# Patient Record
Sex: Female | Born: 1970 | Race: White | Hispanic: No | Smoking: Former smoker
Health system: Southern US, Community
[De-identification: ages and names within clinical notes are randomized; demographics above are authoritative.]

## PROBLEM LIST (undated history)

## (undated) DIAGNOSIS — K649 Unspecified hemorrhoids: Secondary | ICD-10-CM

## (undated) DIAGNOSIS — E119 Type 2 diabetes mellitus without complications: Secondary | ICD-10-CM

## (undated) DIAGNOSIS — F419 Anxiety disorder, unspecified: Secondary | ICD-10-CM

## (undated) DIAGNOSIS — I10 Essential (primary) hypertension: Secondary | ICD-10-CM

## (undated) HISTORY — PX: HERNIA REPAIR: SHX51

## (undated) HISTORY — PX: ACHILLES TENDON SURGERY: SHX542

## (undated) HISTORY — PX: ECTOPIC PREGNANCY SURGERY: SHX613

## (undated) HISTORY — PX: TONSILLECTOMY: SUR1361

## (undated) HISTORY — PX: ADENOIDECTOMY: SUR15

## (undated) HISTORY — PX: TUBAL LIGATION: SHX77

---

## 2011-07-31 ENCOUNTER — Inpatient Hospital Stay (HOSPITAL_COMMUNITY)
Admission: EM | Admit: 2011-07-31 | Discharge: 2011-08-03 | DRG: 639 | Disposition: A | Discharge status: Home / Self Care | Payer: Self-pay | Attending: Family Medicine | Admitting: Family Medicine

## 2011-07-31 DIAGNOSIS — IMO0001 Reserved for inherently not codable concepts without codable children: Principal | ICD-10-CM | POA: Diagnosis present

## 2011-07-31 DIAGNOSIS — K219 Gastro-esophageal reflux disease without esophagitis: Secondary | ICD-10-CM | POA: Diagnosis present

## 2011-07-31 DIAGNOSIS — R059 Cough, unspecified: Secondary | ICD-10-CM | POA: Diagnosis present

## 2011-07-31 DIAGNOSIS — F17200 Nicotine dependence, unspecified, uncomplicated: Secondary | ICD-10-CM | POA: Diagnosis present

## 2011-07-31 DIAGNOSIS — R05 Cough: Secondary | ICD-10-CM | POA: Diagnosis present

## 2011-07-31 DIAGNOSIS — F411 Generalized anxiety disorder: Secondary | ICD-10-CM | POA: Diagnosis present

## 2011-07-31 DIAGNOSIS — E781 Pure hyperglyceridemia: Secondary | ICD-10-CM | POA: Diagnosis present

## 2011-07-31 LAB — BASIC METABOLIC PANEL
BUN: 6 mg/dL (ref 6–23)
BUN: 6 mg/dL (ref 6–23)
Calcium: 9.7 mg/dL (ref 8.4–10.5)
Calcium: 9.2 mg/dL (ref 8.4–10.5)
Creatinine, Ser: 0.77 mg/dL (ref 0.50–1.10)
GFR calc Af Amer: >60 mL/min (ref >60)
GFR calc Af Amer: >60 mL/min (ref >60)
GFR calc non Af Amer: >60 mL/min (ref >60)
Potassium: 3.4 mEq/L — ABNORMAL LOW (ref 3.5–5.1)

## 2011-07-31 LAB — DIFFERENTIAL
Basophils Absolute: 0 10*3/uL (ref 0.0–0.1)
Eosinophils Absolute: 0.1 10*3/uL (ref 0.0–0.7)
Eosinophils Relative: 1 % (ref 0–5)
Lymphs Abs: 3 10*3/uL (ref 0.7–4.0)

## 2011-07-31 LAB — CBC
MCHC: 34.7 g/dL (ref 30.0–36.0)
MCV: 88.1 fL (ref 78.0–100.0)
Platelets: 189 10*3/uL (ref 150–400)
RDW: 12.5 % (ref 11.5–15.5)
WBC: 10.7 10*3/uL — ABNORMAL HIGH (ref 4.0–10.5)

## 2011-07-31 LAB — GLUCOSE, CAPILLARY
Glucose-Capillary: >600 mg/dL (ref 70–99)
Glucose-Capillary: 395 mg/dL — ABNORMAL HIGH (ref 70–99)
Glucose-Capillary: 399 mg/dL — ABNORMAL HIGH (ref 70–99)
Glucose-Capillary: 295 mg/dL — ABNORMAL HIGH (ref 70–99)
Glucose-Capillary: 180 mg/dL — ABNORMAL HIGH (ref 70–99)
Glucose-Capillary: 197 mg/dL — ABNORMAL HIGH (ref 70–99)

## 2011-07-31 LAB — URINALYSIS, ROUTINE W REFLEX MICROSCOPIC
Bilirubin Urine: NEGATIVE
Nitrite: NEGATIVE
Protein, ur: NEGATIVE mg/dL
Specific Gravity, Urine: 1.045 — ABNORMAL HIGH (ref 1.005–1.030)
Urobilinogen, UA: 0.2 mg/dL (ref 0.0–1.0)

## 2011-08-01 LAB — BLOOD GAS, VENOUS
Acid-Base Excess: 0.1 mmol/L (ref 0.0–2.0)
Bicarbonate: 23.9 mEq/L (ref 20.0–24.0)
O2 Saturation: 72 %
Patient temperature: 37
TCO2: 20.6 mmol/L (ref 0–100)
pCO2, Ven: 38.3 mmHg — ABNORMAL LOW (ref 45.0–50.0)
pH, Ven: 7.412 — ABNORMAL HIGH (ref 7.250–7.300)
pO2, Ven: 36.7 mmHg (ref 30.0–45.0)

## 2011-08-01 LAB — GLUCOSE, CAPILLARY
Glucose-Capillary: 172 mg/dL — ABNORMAL HIGH (ref 70–99)
Glucose-Capillary: 174 mg/dL — ABNORMAL HIGH (ref 70–99)
Glucose-Capillary: 212 mg/dL — ABNORMAL HIGH (ref 70–99)
Glucose-Capillary: 217 mg/dL — ABNORMAL HIGH (ref 70–99)
Glucose-Capillary: 133 mg/dL — ABNORMAL HIGH (ref 70–99)
Glucose-Capillary: 141 mg/dL — ABNORMAL HIGH (ref 70–99)
Glucose-Capillary: 115 mg/dL — ABNORMAL HIGH (ref 70–99)
Glucose-Capillary: 158 mg/dL — ABNORMAL HIGH (ref 70–99)
Glucose-Capillary: 234 mg/dL — ABNORMAL HIGH (ref 70–99)
Glucose-Capillary: 255 mg/dL — ABNORMAL HIGH (ref 70–99)
Glucose-Capillary: 383 mg/dL — ABNORMAL HIGH (ref 70–99)
Glucose-Capillary: 308 mg/dL — ABNORMAL HIGH (ref 70–99)

## 2011-08-01 LAB — LIPID PANEL
Cholesterol: 165 mg/dL (ref 0–200)
HDL: 22 mg/dL — ABNORMAL LOW (ref >39)
LDL Cholesterol: UNDETERMINED mg/dL (ref 0–99)
Total CHOL/HDL Ratio: 7.5 RATIO
Triglycerides: 451 mg/dL — ABNORMAL HIGH (ref <150)
VLDL: UNDETERMINED mg/dL (ref 0–40)

## 2011-08-01 LAB — MRSA PCR SCREENING: MRSA by PCR: NEGATIVE

## 2011-08-01 LAB — HEMOGLOBIN A1C
Hgb A1c MFr Bld: 15.3 % — ABNORMAL HIGH (ref <5.7)
Mean Plasma Glucose: 392 mg/dL — ABNORMAL HIGH (ref <117)

## 2011-08-02 LAB — GLUCOSE, CAPILLARY
Glucose-Capillary: 307 mg/dL — ABNORMAL HIGH (ref 70–99)
Glucose-Capillary: 345 mg/dL — ABNORMAL HIGH (ref 70–99)

## 2011-08-03 LAB — GLUCOSE, CAPILLARY
Glucose-Capillary: 303 mg/dL — ABNORMAL HIGH (ref 70–99)
Glucose-Capillary: 286 mg/dL — ABNORMAL HIGH (ref 70–99)

## 2011-08-05 NOTE — Discharge Summary (Signed)
NAME:  Vanessa Barajas, DALAL.:  [id number]  MEDICAL RECORD NO.:  [id number]  LOCATION:  1332                         FACILITY:  [facility]  PHYSICIAN:  Vanessa Sacks, MD    DATE OF BIRTH:  07-Jul-1971  DATE OF ADMISSION:  07/31/2011 DATE OF DISCHARGE:  08/03/2011                              DISCHARGE SUMMARY   PRIMARY CARE PHYSICIAN:  Dr. Marvis Barajas, [facility]  CONDITION ON DISCHARGE:  Improved.  DISPOSITION:  Home.  Please note that the patient's maid name is Vanessa Barajas.  DISCHARGE DIAGNOSES: 1. New diagnosis of uncontrolled diabetes mellitus type 2. 2. Hypertriglyceridemia. 3. Anxiety. 4. Gastroesophageal reflux disease. 5. Smoker, recommended cessation.  HISTORY OF PRESENT ILLNESS:  This is a 40 year old woman who presented with symptomatology, suggestive of diabetes mellitus including polyuria and polydipsia.  She was found to have uncontrolled diabetes and admitted for further evaluation and treatment.  HOSPITAL COURSE:  Vanessa Barajas was admitted and initially placed on insulin infusion.  She has been transitioned to subcutaneous insulin. She was seen in consultation with inpatient diabetic coordinator as well as Nutrition.  She has been given guidance on self-administration of insulin and has been able to do this successfully.  I have also reinforced correct usage of insulin with her and her husband today.  She feels quite well and is ready to go home.  She will be discharged with her Lantus pen as well as short-acting insulin pen and other prescriptions as detailed below.  Of note, case management did discuss availability of insulin at the patient's crisis center which can provide her with Lantus.  In regard to her hypertriglyceridemia, recommend diet and repeat lipid panel in the outpatient setting.  Would consider directed pharmacologic therapy if her triglycerides remain high.  Her anxiety and GERD have remained stable.   She has been counseled on smoking cessation.  CONSULTATIONS:  None.  PROCEDURES:  None.  IMAGING:  None.  MICROBIOLOGY:  None.  PERTINENT LABORATORY STUDIES: 1. Urine pregnancy was negative. 2. Lipid panel notable for a total cholesterol of 165, triglycerides     of 451, HDL 22, LDL not calculated secondary to     hypertriglyceridemia. 3. Hemoglobin A1c 15.3. 4. CBC essentially unremarkable. 5. Basic metabolic panel on discharge unremarkable. 6. Capillary blood sugars stable. 7. Urinalysis essentially negative.  PHYSICAL EXAMINATION:  GENERAL:  On discharge, the patient is feeling well. CARDIOVASCULAR:  Regular rate and rhythm.  No murmur, rub, or gallop. RESPIRATORY:  Clear to auscultation bilaterally.  No wheezes, rales, or rhonchi.  Normal respiratory effort. EXTREMITIES:  No lower extremity edema.  Feet are well dry, but without lesions noted.  DISCHARGE INSTRUCTIONS:  The patient will be discharged home today. Diet is a diabetic diet, low starch.  Activity is unrestricted.  Follow up with Dr. Marvis Barajas in 1 week.  DISCHARGE MEDICATIONS NEW: 1. Acetaminophen 325 mg 2 tablets every 4 hours as needed for pain. 2. Glucometer, use as directed. 3. Glucose test strips, use as directed. 4. Insulin aspartate FlexPen 1-9 units subcutaneously t.i.d. with     meals.  For blood sugar 70-120 zero units, for blood sugar 121-150  give 1 unit, for blood sugar 151-200 give 2 units, for blood sugar     201-250 give 3 units, for blood sugar 251-300 give 5 units, for     blood sugar 301-350 give 7 units, for blood sugar 351-400 give 9     units, for blood sugar greater than 400 call physician. 5. Lantus pen 30 units subcutaneously daily. 6. Insulin pen needles, use as directed. 7. Metformin 500 mg p.o. b.i.d. 8. Nicotine transdermal patch 14 mg per 24 hours use daily if not     smoking.  MEDICATIONS CHANGED:  Flexeril 10 mg p.o. t.i.d. as needed for muscle spasm.  No  medications given.  Resume the following medications, 1. Albuterol inhaler 2 puffs twice a day as needed for shortness of     breath and chronic bronchitis. 2. Ibuprofen 200 mg 4 capsules every 6 hours as needed for headache. 3. Nexium 40 mg p.o. daily. 4. Risperdal 1 tablet 1 mg p.o. nightly. 5. Xanax 1 mg 4 tablets p.o. nightly.  No prescription given.  Time coordinating discharge including reinforcing teaching and answering questions of the patient and husband, 60 minutes.     Vanessa Sacks, MD     DG/MEDQ  D:  08/03/2011  T:  08/03/2011  Job:  [id number]  cc:   Dr. Marvis Barajas [facility]  Electronically Signed by Vanessa Barajas  on 08/05/2011 10:28:11 PM

## 2011-08-13 NOTE — H&P (Signed)
NAME:  Vanessa Barajas, Vanessa Barajas NO.:  [id number]  MEDICAL RECORD NO.:  [id number]  LOCATION:  WLED                         FACILITY:  [facility]  PHYSICIAN:  Suella Grove, MD          DATE OF BIRTH:  May 17, 1971  DATE OF ADMISSION:  07/31/2011 DATE OF DISCHARGE:                             HISTORY & PHYSICAL   PRIMARY CARE PHYSICIAN:  Marvis Repress who is part of the [facility].  CHIEF COMPLAINT:  Vision changes and light-headedness.  HISTORY OF PRESENT ILLNESS:  The patient is a 40 year old woman, history of anxiety, GERD, and chronic cough who presents for evaluation of vision changes and light-headedness/dizziness.  The patient reports that over the past 6 months that she has been having problems with polyuria and polydipsia, which she did not feel needed evaluation.  She reports that she had been feeling this way for a while and never really considered that she may be sick.  However, starting last night, she reports that she started to have some blurry vision with associated light-headedness and dizziness.  No problems with any chest pain, shortness of breath, nausea, or vomiting.  Does complain of a little bit of dyspnea on exertion at baseline, although that has not grossly changed.  No problems with any headache or other neurologic deficits.  In the emergency department, her temperature was 98.6, blood pressure 119/89, heart rate 102, respirations 20, and saturating 99% on room air. Laboratory work was significant for a glucose of 718 in addition with some other electrolyte disturbances.  The patient was started on an insulin stabilization protocol, admitted for further treatment and evaluation.  PAST MEDICAL HISTORY: 1. Anxiety. 2. Gastroesophageal reflux disease. 3. Chronic cough, for which she is taking an inhaler and given the     diagnosis of "chronic bronchitis."  PAST SURGICAL HISTORY:  Remote history of tonsillectomy.   Bilateral tubal ligation in 2001.  ALLERGIES:  No known drug allergies.  MEDICATIONS: 1. Xanax 4 mg p.o. q.h.s. 2. Risperdal, unknown dose. 3. Nexium 40 mg p.o. daily. 4. Albuterol 2 puffs inhaled b.i.d. p.r.n.  FAMILY HISTORY:  No known history of diabetes in the family.  SOCIAL HISTORY:  Married, currently not working.  Reports smoking at least half a pack per day for the past 24 years.  No EtOH.  Denies any illicits.  REVIEW OF SYSTEMS:  GENERAL:  As above.  HEENT:  Positive vision changes as described above.  No dysphagia, problems with hearing.  RESPIRATORY: Chronic cough with occasional scant hemoptysis, which has been evaluated by the PCP.  CARDIOVASCULAR:  No chest pain, palpitations, or shortness of breath.  GASTROINTESTINAL:  No abdominal pain, melena, hematochezia, diarrhea, or hematemesis.  GENITOURINARY:  No dysuria, hematuria, or increased frequency/urgency other than the polyuria described above. ENDOCRINE:  As described above.  HEMATOLOGIC:  No easy bruising or bleeding.  NEUROLOGIC:  As above.  PSYCHIATRIC:  No depression.  No suicidal or homicidal ideations.  PHYSICAL EXAMINATION:  GENERAL:  Alert and oriented x3, in no apparent distress, sitting up, drinking water. VITAL SIGNS:  As above. HEENT:  Sclerae anicteric, pupils equal and reactive to light and  accommodation, extraocular motions are intact, oropharynx is unremarkable. NECK:  Soft, full.  No LAD, lymphadenopathy, or thyromegaly. LUNGS:  Clear to auscultation bilaterally.  No wheezes, rales, or rhonchi. HEART:  Regular rate and rhythm, normal S1 and S2.  No rubs, gallops, or murmurs. ABDOMEN:  Soft, obese, nontender to palpation, positive bowel sounds, no masses. EXTREMITIES:  No cyanosis or clubbing.  Positive 2+ nonpitting edema. 2+ DP and radial pulses bilaterally. NEUROLOGIC:  Speech is fluent.  Cranial nerves II through XII are intact.  Finger-nose-finger intact.  Negative pronator drift.   Strength is 5/5 bilateral upper and lower extremities.  Sensation is grossly intact.  Deep tendon reflexes are 2+ throughout.  LABORATORY DATA:  See the complete blood count:  White count 10.7, hemoglobin 15.2, and platelet count 189.  Chemistry:  Sodium 125, potassium 3.4, chloride 87, bicarbonate 23, BUN 6, creatinine 0.72, glucose 718, calcium 9.7.  Urinalysis:  Glucose greater on 1000, trace ketones.  IMPRESSION/PLAN: 1. Hyperglycemia:  Likely nonketotic hyperosmolar state.  We will     admit to the General Medicine Service or step-down service on an     insulin drip, to stabilize the patient's blood sugar.  Plan will be     to titrate her off the insulin drip and on to p.o. when able to     bring down the glucose to less than 200.  We will have to keep a     close eye on the potassium given that her potassium started of at     3.4 and is expected to decrease with the insulin regimen.  We also     have to continue hydrating the patient, given likely volume     depleted state from her hyperosmolar condition.  Start metformin     500 mg b.i.d. starting tomorrow.  Check her hemoglobin A1c as well     as fasting lipid panel given lipid goals with diabetes.  Given her     blood pressure, we will attempt to start an ACE inhibitor, although     the patient does report that she generally has lower blood     pressures. 2. Vision changes, light-headedness/dizziness:  Likely radiated to the     hyperglycemia.  We will continue to monitor as her blood sugars     improve. 3. Hyponatremia:  Likely artifactual secondary to hyperosmolar state.     We will continue to monitor. 4. Anxiety:  Continue the patient's home regimen of Xanax and     Risperdal.  I did mention to the patient that the Risperdal may be     contributing to her glucose abnormalities.  We will have to follow     up with her PCP as an outpatient. 5. Gastroesophageal reflux disease:  Continue home Nexium. 6. Chronic cough:   Albuterol p.r.n. 7. Tobacco abuse:  Nicotine patch. 8. Fluid/electrolytes/nutrition:  Fluids and electrolytes as above. We     will start a low carbohydrate diet.  DISPOSITION:  Diabetes consult.  In addition, the patient has been educated on the effect of weight loss on her condition.  She acknowledges and is interested in losing more weight.  CODE STATUS:  Full code.          ______________________________ Suella Grove, MD     AC/MEDQ  D:  07/31/2011  T:  08/01/2011  Job:  [id number]  Electronically Signed by Suella Grove MD on 08/13/2011 06:41:52 PM

## 2014-03-09 ENCOUNTER — Encounter (HOSPITAL_COMMUNITY): Payer: Self-pay | Admitting: Emergency Medicine

## 2014-03-09 ENCOUNTER — Emergency Department (HOSPITAL_COMMUNITY)
Admission: EM | Admit: 2014-03-09 | Discharge: 2014-03-10 | Disposition: A | Discharge status: Home / Self Care | Payer: BC Managed Care – PPO | Attending: Emergency Medicine | Admitting: Emergency Medicine

## 2014-03-09 DIAGNOSIS — L723 Sebaceous cyst: Secondary | ICD-10-CM

## 2014-03-09 DIAGNOSIS — Z87891 Personal history of nicotine dependence: Secondary | ICD-10-CM | POA: Insufficient documentation

## 2014-03-09 DIAGNOSIS — E119 Type 2 diabetes mellitus without complications: Secondary | ICD-10-CM | POA: Insufficient documentation

## 2014-03-09 HISTORY — DX: Type 2 diabetes mellitus without complications: E11.9

## 2014-03-09 NOTE — Discharge Instructions (Signed)
Keep wound clean. Follow up with your doctor for further evaluation.

## 2014-03-09 NOTE — ED Provider Notes (Signed)
CSN: [id number]     Arrival date & time 03/09/14  2218 History  This chart was scribed for non-physician practitioner Alvina Chou, PA-C working with Hoy Morn, MD by Zettie Pho, ED Scribe. This patient was seen in room WTR7/WTR7 and the patient's care was started at 11:40 PM.    Chief Complaint  Patient presents with  . Abscess   Patient is a 43 y.o. female presenting with abscess. The history is provided by the patient. No language interpreter was used.  Abscess Location:  Head/neck Head/neck abscess location:  L ear Abscess quality: painful   Red streaking: no   Progression:  Worsening Pain details:    Severity:  Moderate   Timing:  Constant   Progression:  Worsening Chronicity:  New Context: diabetes   Relieved by:  Nothing Worsened by:  Nothing tried Ineffective treatments:  None tried Risk factors: no prior abscess    HPI Comments: Vanessa Barajas is a 43 y.o. female who presents to the Emergency Department complaining of a painful abscess to the top of the external left ear onset about a week ago that she states has been progressively worsening. She denies any potential injury or trauma to the area or history of prior abscesses. Patient has a history of DM and states that her blood sugar has been well-controlled.   Past Medical History  Diagnosis Date  . Diabetes mellitus without complication    Past Surgical History  Procedure Laterality Date  . Hernia repair    . Tonsillectomy    . Adenoidectomy     Family History  Problem Relation Age of Onset  . Leukemia Mother   . Cancer Other    History  Substance Use Topics  . Smoking status: Former Research scientist (life sciences)  . Smokeless tobacco: Not on file  . Alcohol Use: No   OB History   Grav Para Term Preterm Abortions TAB SAB Ect Mult Living                 Review of Systems  Skin: Positive for wound (abscess).  All other systems reviewed and are negative.  Allergies  Review of patient's allergies indicates no known  allergies.  Home Medications   Prior to Admission medications   Not on File   Triage Vitals: BP 134/87  Pulse 95  Temp(Src) 98.3 F (36.8 C) (Oral)  Resp 18  SpO2 94%  LMP 01/08/2014  Physical Exam  Nursing note and vitals reviewed. Constitutional: She is oriented to person, place, and time. She appears well-developed and well-nourished. No distress.  HENT:  Head: Normocephalic and atraumatic.  Large fluatuant mass at the left ear auricle. No open wound.   Eyes: Conjunctivae are normal.  Neck: Normal range of motion. Neck supple.  Pulmonary/Chest: Effort normal. No respiratory distress.  Abdominal: She exhibits no distension.  Musculoskeletal: Normal range of motion.  Neurological: She is alert and oriented to person, place, and time.  Skin: Skin is warm and dry.  Psychiatric: She has a normal mood and affect. Her behavior is normal.    ED Course  Procedures (including critical care time)  DIAGNOSTIC STUDIES: Oxygen Saturation is 94% on room air, adequate by my interpretation.    COORDINATION OF CARE: 11:42 PM- Will perform an incision and drainage of the area. Discussed treatment plan with patient at bedside and patient verbalized agreement.   11:53 PM- Successfully performed incision and drainage. Advised patient to follow up with her PCP. Discussed treatment plan with patient at bedside and  patient verbalized agreement.   INCISION AND DRAINAGE PROCEDURE NOTE: Patient identification was confirmed and verbal consent was obtained. This procedure was performed by Alvina Chou, PA-C  at 11:42 PM. Site: left ear Sterile procedures observed Anesthetic used (type and amt): Pain Ease topical spray Blade size: 11 Drainage: sebaceous fluid Complexity: simple Site anesthetized, incision made over site, wound drained and explored loculations, rinsed with copious amounts of normal saline, wound packed with sterile gauze, covered with dry, sterile dressing.   Pt tolerated procedure well without complications.  Instructions for care discussed verbally and pt provided with additional written instructions for homecare and f/u.   Labs Review Labs Reviewed - No data to display  Imaging Review No results found.   EKG Interpretation None      MDM   Final diagnoses:  Sebaceous cyst of ear    12:00 AM Cyst drained without complication. Vitals stable and patient afebrile. Patient will follow up with her PCP.   I personally performed the services described in this documentation, which was scribed in my presence. The recorded information has been reviewed and is accurate.     Alvina Chou, PA-C 03/10/14 0001

## 2014-03-09 NOTE — ED Notes (Signed)
Pt states she is having pain to her left ear  Pt has swelling to the top part of her ear  Pt states it is painful  Pt states it has been there about a week

## 2014-03-10 NOTE — ED Provider Notes (Signed)
Medical screening examination/treatment/procedure(s) were performed by non-physician practitioner and as supervising physician I was immediately available for consultation/collaboration.   EKG Interpretation None        Hoy Morn, MD 03/10/14 [phone]

## 2014-07-17 ENCOUNTER — Emergency Department (HOSPITAL_COMMUNITY)
Admission: EM | Admit: 2014-07-17 | Discharge: 2014-07-18 | Discharge status: Against Medical Advice | Payer: BC Managed Care – PPO | Attending: Emergency Medicine | Admitting: Emergency Medicine

## 2014-07-17 ENCOUNTER — Encounter (HOSPITAL_COMMUNITY): Payer: Self-pay | Admitting: Emergency Medicine

## 2014-07-17 DIAGNOSIS — R0602 Shortness of breath: Secondary | ICD-10-CM | POA: Diagnosis not present

## 2014-07-17 DIAGNOSIS — R079 Chest pain, unspecified: Secondary | ICD-10-CM | POA: Diagnosis not present

## 2014-07-17 DIAGNOSIS — E119 Type 2 diabetes mellitus without complications: Secondary | ICD-10-CM | POA: Insufficient documentation

## 2014-07-17 DIAGNOSIS — I10 Essential (primary) hypertension: Secondary | ICD-10-CM | POA: Insufficient documentation

## 2014-07-17 HISTORY — DX: Essential (primary) hypertension: I10

## 2014-07-17 HISTORY — DX: Anxiety disorder, unspecified: F41.9

## 2014-07-17 LAB — CBC
HCT: 34 % — ABNORMAL LOW (ref 36.0–46.0)
Hemoglobin: 10.5 g/dL — ABNORMAL LOW (ref 12.0–15.0)
MCH: 26.3 pg (ref 26.0–34.0)
MCHC: 30.9 g/dL (ref 30.0–36.0)
MCV: 85 fL (ref 78.0–100.0)
PLATELETS: 226 10*3/uL (ref 150–400)
RBC: 4 MIL/uL (ref 3.87–5.11)
RDW: 15.1 % (ref 11.5–15.5)
WBC: 8.6 10*3/uL (ref 4.0–10.5)

## 2014-07-17 LAB — BASIC METABOLIC PANEL
ANION GAP: 15 (ref 5–15)
BUN: 15 mg/dL (ref 6–23)
CALCIUM: 9.1 mg/dL (ref 8.4–10.5)
CO2: 22 meq/L (ref 19–32)
Chloride: 102 mEq/L (ref 96–112)
Creatinine, Ser: 1.05 mg/dL (ref 0.50–1.10)
GFR, EST AFRICAN AMERICAN: 73 mL/min — AB (ref >90)
GFR, EST NON AFRICAN AMERICAN: 63 mL/min — AB (ref >90)
Glucose, Bld: 101 mg/dL — ABNORMAL HIGH (ref 70–99)
Potassium: 3.5 mEq/L — ABNORMAL LOW (ref 3.7–5.3)
SODIUM: 139 meq/L (ref 137–147)

## 2014-07-17 LAB — I-STAT TROPONIN, ED: TROPONIN I, POC: 0 ng/mL (ref 0.00–0.08)

## 2014-07-17 NOTE — ED Notes (Signed)
Pt states she has been having chest pain off and on for the past 2 days but it has been constant for the past 2 hrs Pt states the pain radiates up into her neck and down her left arm  Pt states she has felt short of breath, nausea, and lightheaded today  Pt is tearful and anxious in triage

## 2014-07-18 NOTE — ED Notes (Signed)
Called pt to take to treatment room  No response from lobby 

## 2014-09-22 ENCOUNTER — Encounter (HOSPITAL_COMMUNITY): Payer: Self-pay | Admitting: *Deleted

## 2014-09-22 ENCOUNTER — Emergency Department (HOSPITAL_COMMUNITY)
Admission: EM | Admit: 2014-09-22 | Discharge: 2014-09-22 | Disposition: A | Discharge status: Home / Self Care | Payer: BC Managed Care – PPO | Attending: Emergency Medicine | Admitting: Emergency Medicine

## 2014-09-22 DIAGNOSIS — F419 Anxiety disorder, unspecified: Secondary | ICD-10-CM | POA: Insufficient documentation

## 2014-09-22 DIAGNOSIS — I10 Essential (primary) hypertension: Secondary | ICD-10-CM | POA: Insufficient documentation

## 2014-09-22 DIAGNOSIS — Z87891 Personal history of nicotine dependence: Secondary | ICD-10-CM | POA: Insufficient documentation

## 2014-09-22 DIAGNOSIS — E119 Type 2 diabetes mellitus without complications: Secondary | ICD-10-CM | POA: Diagnosis not present

## 2014-09-22 DIAGNOSIS — Z79899 Other long term (current) drug therapy: Secondary | ICD-10-CM | POA: Insufficient documentation

## 2014-09-22 DIAGNOSIS — J029 Acute pharyngitis, unspecified: Secondary | ICD-10-CM | POA: Diagnosis present

## 2014-09-22 LAB — RAPID STREP SCREEN (MED CTR MEBANE ONLY): Streptococcus, Group A Screen (Direct): NEGATIVE

## 2014-09-22 MED ORDER — HYDROCODONE-HOMATROPINE 5-1.5 MG/5ML PO SYRP: 5.0000 mL | ORAL_SOLUTION | Freq: Four times a day (QID) | ORAL | Status: DC | PRN

## 2014-09-22 MED ORDER — LIDOCAINE VISCOUS 2 % MT SOLN
15.0000 mL | Freq: Once | OROMUCOSAL | Status: AC
  Administered 2014-09-22: 15 mL via OROMUCOSAL
  Filled 2014-09-22: qty 15

## 2014-09-22 NOTE — Discharge Instructions (Signed)
Please follow up with your primary care physician in 1-2 days. If you do not have one please call the [facility] number listed above. Please alternate between Motrin and Tylenol every three hours for fevers and pain. Please read all discharge instructions and return precautions.  ° °Pharyngitis °Pharyngitis is redness, pain, and swelling (inflammation) of your pharynx.  °CAUSES  °Pharyngitis is usually caused by infection. Most of the time, these infections are from viruses (viral) and are part of a cold. However, sometimes pharyngitis is caused by bacteria (bacterial). Pharyngitis can also be caused by allergies. Viral pharyngitis may be spread from person to person by coughing, sneezing, and personal items or utensils (cups, forks, spoons, toothbrushes). Bacterial pharyngitis may be spread from person to person by more intimate contact, such as kissing.  °SIGNS AND SYMPTOMS  °Symptoms of pharyngitis include:   °· Sore throat.   °· Tiredness (fatigue).   °· Low-grade fever.   °· Headache. °· Joint pain and muscle aches. °· Skin rashes. °· Swollen lymph nodes. °· Plaque-like film on throat or tonsils (often seen with bacterial pharyngitis). °DIAGNOSIS  °Your health care provider will ask you questions about your illness and your symptoms. Your medical history, along with a physical exam, is often all that is needed to diagnose pharyngitis. Sometimes, a rapid strep test is done. Other lab tests may also be done, depending on the suspected cause.  °TREATMENT  °Viral pharyngitis will usually get better in 3-4 days without the use of medicine. Bacterial pharyngitis is treated with medicines that kill germs (antibiotics).  °HOME CARE INSTRUCTIONS  °· Drink enough water and fluids to keep your urine clear or pale yellow.   °· Only take over-the-counter or prescription medicines as directed by your health care provider:   °¨ If you are prescribed antibiotics, make sure you finish them even if you start  to feel better.   °¨ Do not take aspirin.   °· Get lots of rest.   °· Gargle with 8 oz of salt water (½ tsp of salt per 1 qt of water) as often as every 1-2 hours to soothe your throat.   °· Throat lozenges (if you are not at risk for choking) or sprays may be used to soothe your throat. °SEEK MEDICAL CARE IF:  °· You have large, tender lumps in your neck. °· You have a rash. °· You cough up green, yellow-brown, or bloody spit. °SEEK IMMEDIATE MEDICAL CARE IF:  °· Your neck becomes stiff. °· You drool or are unable to swallow liquids. °· You vomit or are unable to keep medicines or liquids down. °· You have severe pain that does not go away with the use of recommended medicines. °· You have trouble breathing (not caused by a stuffy nose). °MAKE SURE YOU:  °· Understand these instructions. °· Will watch your condition. °· Will get help right away if you are not doing well or get worse. °Document Released: 10/27/2005 Document Revised: 08/17/2013 Document Reviewed: 07/04/2013 °[employer]® Patient Information ©2015 [employer]. This information is not intended to replace advice given to you by your health care provider. Make sure you discuss any questions you have with your health care provider. ° °

## 2014-09-22 NOTE — ED Notes (Signed)
Pt states that she began having a sore throat on Monday; pt states she took some Cephalexin she had at home from May; pt states it was approx 2 days worth; pt states that she was feeling better but that the sore throat has increased today; pt states that her mouth burns and it is like swallowing glass

## 2014-09-22 NOTE — ED Provider Notes (Signed)
CSN: [id number]     Arrival date & time 09/22/14  0402 History   First MD Initiated Contact with Patient 09/22/14 0424     Chief Complaint  Patient presents with  . Sore Throat     (Consider location/radiation/quality/duration/timing/severity/associated sxs/prior Treatment) HPI Comments: Patient is a 43 yo M PMHx significant for DM, HTN presenting to the ED for five-day history of burning sharp pain in her sore throat with associated fever and chills. Patient denies any associated nasal congestion, rhinorrhea, cough. She states she has been using Tylenol and Motrin with improvement of her fevers. She states she had some leftover Cephalaexin seem to improve her symptoms until she ran out of her antibiotic. Denies any vomiting, diarrhea, abdominal pain. Denies sick contacts.   Patient is a 43 y.o. female presenting with pharyngitis.  Sore Throat Associated symptoms include chills, a fever and a sore throat.    Past Medical History  Diagnosis Date  . Diabetes mellitus without complication   . Hypertension   . Anxiety    Past Surgical History  Procedure Laterality Date  . Hernia repair    . Tonsillectomy    . Adenoidectomy     Family History  Problem Relation Age of Onset  . Leukemia Mother   . Cancer Other    History  Substance Use Topics  . Smoking status: Former Smoker    Quit date: 09/02/2013  . Smokeless tobacco: Not on file  . Alcohol Use: No   OB History    No data available     Review of Systems  Constitutional: Positive for fever and chills.  HENT: Positive for sore throat.   All other systems reviewed and are negative.     Allergies  Review of patient's allergies indicates no known allergies.  Home Medications   Prior to Admission medications   Medication Sig Start Date End Date Taking? Authorizing Provider  acetaminophen (TYLENOL) 500 MG tablet Take 1,000 mg by mouth every 6 (six) hours as needed for moderate pain.   Yes Historical Provider, MD   ALPRAZolam (XANAX XR) 2 MG 24 hr tablet TAKE ONE TABLET BY MOUTH ONCE DAILY AS NEEDED 09/21/14  Yes Historical Provider, MD  ALPRAZolam (XANAX XR) 2 MG 24 hr tablet Take 2 mg by mouth daily as needed. Anxiety   Yes Historical Provider, MD  escitalopram (LEXAPRO) 20 MG tablet Take 20 mg by mouth at bedtime.    Yes Historical Provider, MD  ibuprofen (ADVIL,MOTRIN) 200 MG tablet Take 800 mg by mouth every 6 (six) hours as needed for moderate pain.   Yes Historical Provider, MD  IRON PO Take 1 tablet by mouth daily.   Yes Historical Provider, MD  lisinopril (PRINIVIL,ZESTRIL) 20 MG tablet Take 20 mg by mouth every morning.   Yes Historical Provider, MD  metaxalone (SKELAXIN) 400 MG tablet Take 400 mg by mouth once as needed (restless leg).   Yes Historical Provider, MD  metFORMIN (GLUCOPHAGE) 1000 MG tablet Take 1,000 mg by mouth 2 (two) times daily with a meal.   Yes Historical Provider, MD  omeprazole (PRILOSEC) 40 MG capsule Take 40 mg by mouth daily.   Yes Historical Provider, MD  clonazePAM (KLONOPIN) 2 MG tablet Take 2 mg by mouth daily.    Historical Provider, MD  HYDROcodone-homatropine (HYCODAN) 5-1.5 MG/5ML syrup Take 5 mLs by mouth every 6 (six) hours as needed for cough. 09/22/14   Garvey Westcott L Razan Siler, PA-C   BP 133/88 mmHg  Pulse 86  Temp(Src) 98  F (36.7 C) (Oral)  Resp 16  SpO2 98%  LMP 08/25/2014 Physical Exam  Constitutional: She is oriented to person, place, and time. She appears well-developed and well-nourished. No distress.  HENT:  Head: Normocephalic and atraumatic.  Right Ear: External ear normal.  Left Ear: External ear normal.  Nose: Nose normal.  Mouth/Throat: Uvula is midline and mucous membranes are normal. No trismus in the jaw. No uvula swelling. Posterior oropharyngeal erythema present. No oropharyngeal exudate, posterior oropharyngeal edema or tonsillar abscesses.  Eyes: Conjunctivae are normal.  Neck: Normal range of motion. Neck supple.  Cardiovascular:  Normal rate, regular rhythm and normal heart sounds.   Pulmonary/Chest: Effort normal and breath sounds normal. No respiratory distress.  Abdominal: Soft. There is no tenderness.  Musculoskeletal: Normal range of motion.  Lymphadenopathy:    She has cervical adenopathy.  Neurological: She is alert and oriented to person, place, and time.  Skin: Skin is warm and dry. She is not diaphoretic.  Psychiatric: She has a normal mood and affect.  Nursing note and vitals reviewed.   ED Course  Procedures (including critical care time) Medications  lidocaine (XYLOCAINE) 2 % viscous mouth solution 15 mL (15 mLs Mouth/Throat Given 09/22/14 0502)    Labs Review Labs Reviewed  RAPID STREP SCREEN  CULTURE, GROUP A STREP    Imaging Review No results found.   EKG Interpretation None      MDM   Final diagnoses:  Viral pharyngitis    Filed Vitals:   09/22/14 0410  BP: 133/88  Pulse: 86  Temp: 98 F (36.7 C)  Resp: 16   Afebrile, NAD, non-toxic appearing, AAOx4.  Pt afebrile without tonsillar exudate, negative strep. Presents with mild cervical lymphadenopathy, & dysphagia; diagnosis of viral pharyngitis. No abx indicated. DC w symptomatic tx for pain  Pt does not appear dehydrated, but did discuss importance of water rehydration. Presentation non concerning for PTA or infxn spread to soft tissue. No trismus or uvula deviation. Specific return precautions discussed. Pt able to drink water in ED without difficulty with intact air way. Recommended PCP follow up.     Harlow Mares, PA-C 09/22/14 0507  Merryl Hacker, MD 09/22/14 [phone]

## 2014-09-24 LAB — CULTURE, GROUP A STREP

## 2015-12-02 ENCOUNTER — Emergency Department (HOSPITAL_COMMUNITY)
Admission: EM | Admit: 2015-12-02 | Discharge: 2015-12-02 | Disposition: A | Discharge status: Home / Self Care | Payer: BLUE CROSS/BLUE SHIELD | Attending: Emergency Medicine | Admitting: Emergency Medicine

## 2015-12-02 ENCOUNTER — Encounter (HOSPITAL_COMMUNITY): Payer: Self-pay | Admitting: Oncology

## 2015-12-02 DIAGNOSIS — E119 Type 2 diabetes mellitus without complications: Secondary | ICD-10-CM | POA: Diagnosis not present

## 2015-12-02 DIAGNOSIS — I10 Essential (primary) hypertension: Secondary | ICD-10-CM | POA: Insufficient documentation

## 2015-12-02 DIAGNOSIS — Z7984 Long term (current) use of oral hypoglycemic drugs: Secondary | ICD-10-CM | POA: Insufficient documentation

## 2015-12-02 DIAGNOSIS — L03211 Cellulitis of face: Secondary | ICD-10-CM | POA: Insufficient documentation

## 2015-12-02 DIAGNOSIS — Z79899 Other long term (current) drug therapy: Secondary | ICD-10-CM | POA: Insufficient documentation

## 2015-12-02 DIAGNOSIS — Z87891 Personal history of nicotine dependence: Secondary | ICD-10-CM | POA: Diagnosis not present

## 2015-12-02 DIAGNOSIS — R220 Localized swelling, mass and lump, head: Secondary | ICD-10-CM | POA: Diagnosis present

## 2015-12-02 MED ORDER — CLINDAMYCIN HCL 300 MG PO CAPS: 300.0000 mg | ORAL_CAPSULE | Freq: Three times a day (TID) | ORAL | Status: DC

## 2015-12-02 MED ORDER — CLINDAMYCIN HCL 300 MG PO CAPS
300.0000 mg | ORAL_CAPSULE | Freq: Once | ORAL | Status: AC
  Administered 2015-12-02: 300 mg via ORAL
  Filled 2015-12-02: qty 1

## 2015-12-02 NOTE — Discharge Instructions (Signed)

## 2015-12-02 NOTE — ED Notes (Signed)
Pt presents d/t left facial swelling since Friday.  Pt rates pain 8/10 aching in nature.

## 2015-12-02 NOTE — ED Provider Notes (Signed)
CSN: [id number]     Arrival date & time 12/02/15  0303 History   First MD Initiated Contact with Patient 12/02/15 0422     Chief Complaint  Patient presents with  . Facial Swelling     (Consider location/radiation/quality/duration/timing/severity/associated sxs/prior Treatment) HPI Comments: Patient presents with facial pain and swelling x 3 days without injury, or fever. She denies any dental pain. She was prompted to come to the emergency room when she woke up tonight with swelling that spread to the lower left face and increased swelling around the eye. No visual impairment or pain with eye movement. No eye drainage. No sore throat or difficulty swallowing.   The history is provided by the patient. No language interpreter was used.    Past Medical History  Diagnosis Date  . Diabetes mellitus without complication ([place])   . Hypertension   . Anxiety    Past Surgical History  Procedure Laterality Date  . Hernia repair    . Tonsillectomy    . Adenoidectomy     Family History  Problem Relation Age of Onset  . Leukemia Mother   . Cancer Other    Social History  Substance Use Topics  . Smoking status: Former Smoker    Quit date: 09/02/2013  . Smokeless tobacco: Never Used  . Alcohol Use: No   OB History    No data available     Review of Systems  Constitutional: Negative for fever and chills.  HENT: Positive for facial swelling. Negative for congestion, dental problem, sinus pressure, sore throat and trouble swallowing.   Eyes: Negative for pain, discharge and visual disturbance.  Gastrointestinal: Negative.  Negative for nausea.  Musculoskeletal: Negative.  Negative for myalgias.  Skin: Positive for color change. Negative for wound.  Neurological: Negative.  Negative for headaches.      Allergies  Review of patient's allergies indicates no known allergies.  Home Medications   Prior to Admission medications   Medication Sig Start Date End Date Taking?  Authorizing Provider  acetaminophen (TYLENOL) 500 MG tablet Take 1,000 mg by mouth every 6 (six) hours as needed for moderate pain.    Historical Provider, MD  ALPRAZolam (XANAX XR) 2 MG 24 hr tablet TAKE ONE TABLET BY MOUTH ONCE DAILY AS NEEDED 09/21/14   Historical Provider, MD  ALPRAZolam (XANAX XR) 2 MG 24 hr tablet Take 2 mg by mouth daily as needed. Anxiety    Historical Provider, MD  clonazePAM (KLONOPIN) 2 MG tablet Take 2 mg by mouth daily.    Historical Provider, MD  escitalopram (LEXAPRO) 20 MG tablet Take 20 mg by mouth at bedtime.     Historical Provider, MD  HYDROcodone-homatropine (HYCODAN) 5-1.5 MG/5ML syrup Take 5 mLs by mouth every 6 (six) hours as needed for cough. 09/22/14   Jennifer Piepenbrink, PA-C  ibuprofen (ADVIL,MOTRIN) 200 MG tablet Take 800 mg by mouth every 6 (six) hours as needed for moderate pain.    Historical Provider, MD  IRON PO Take 1 tablet by mouth daily.    Historical Provider, MD  lisinopril (PRINIVIL,ZESTRIL) 20 MG tablet Take 20 mg by mouth every morning.    Historical Provider, MD  metaxalone (SKELAXIN) 400 MG tablet Take 400 mg by mouth once as needed (restless leg).    Historical Provider, MD  metFORMIN (GLUCOPHAGE) 1000 MG tablet Take 1,000 mg by mouth 2 (two) times daily with a meal.    Historical Provider, MD  omeprazole (PRILOSEC) 40 MG capsule Take 40 mg by mouth  daily.    Historical Provider, MD   BP 142/90 mmHg  Pulse 80  Temp(Src) 97.9 F (36.6 C) (Oral)  Resp 20  Ht 5\' 7"  (1.702 m)  Wt 125.646 kg  BMI 43.37 kg/m2  SpO2 98%  LMP 11/29/2015 Physical Exam  Constitutional: She is oriented to person, place, and time. She appears well-developed and well-nourished.  HENT:  Left maxillary face slightly erythematous that extends through cheek to lower left face. No palpable or discrete abscess. Severe dental decay without visualized apical abscess.  Eyes: Conjunctivae are normal.  No periorbital swelling or redness. No pain with eye movement.    Neck: Normal range of motion. Neck supple.  Pulmonary/Chest: Effort normal.  Neurological: She is alert and oriented to person, place, and time.  Skin: Skin is warm and dry.    ED Course  Procedures (including critical care time) Labs Review Labs Reviewed - No data to display  Imaging Review No results found. I have personally reviewed and evaluated these images and lab results as part of my medical decision-making.   EKG Interpretation None      MDM   Final diagnoses:  None    1. Facial cellulitis  Will start on abx and encourage 2 day recheck with PCP. Return precautions discussed. VSS, afebrile. Patient is well appearing and comfortable. Stable for discharge home.     Charlann Lange, PA-C 12/02/15 0503  Lacretia Leigh, MD 12/03/15 [phone]

## 2016-01-22 ENCOUNTER — Encounter (HOSPITAL_COMMUNITY): Payer: Self-pay | Admitting: Emergency Medicine

## 2016-01-22 ENCOUNTER — Emergency Department (HOSPITAL_COMMUNITY)
Admission: EM | Admit: 2016-01-22 | Discharge: 2016-01-22 | Disposition: A | Discharge status: Home / Self Care | Payer: BLUE CROSS/BLUE SHIELD | Attending: Emergency Medicine | Admitting: Emergency Medicine

## 2016-01-22 DIAGNOSIS — Z5321 Procedure and treatment not carried out due to patient leaving prior to being seen by health care provider: Secondary | ICD-10-CM

## 2016-01-22 DIAGNOSIS — E119 Type 2 diabetes mellitus without complications: Secondary | ICD-10-CM | POA: Diagnosis not present

## 2016-01-22 DIAGNOSIS — I10 Essential (primary) hypertension: Secondary | ICD-10-CM | POA: Diagnosis not present

## 2016-01-22 DIAGNOSIS — N939 Abnormal uterine and vaginal bleeding, unspecified: Secondary | ICD-10-CM | POA: Diagnosis present

## 2016-01-22 DIAGNOSIS — R1030 Lower abdominal pain, unspecified: Secondary | ICD-10-CM | POA: Diagnosis not present

## 2016-01-22 LAB — BASIC METABOLIC PANEL
Anion gap: 5 (ref 5–15)
BUN: 9 mg/dL (ref 6–20)
CHLORIDE: 110 mmol/L (ref 101–111)
CO2: 25 mmol/L (ref 22–32)
Calcium: 8.6 mg/dL — ABNORMAL LOW (ref 8.9–10.3)
Creatinine, Ser: 0.88 mg/dL (ref 0.44–1.00)
GFR calc Af Amer: >60 mL/min (ref >60)
GFR calc non Af Amer: >60 mL/min (ref >60)
GLUCOSE: 138 mg/dL — AB (ref 65–99)
Potassium: 3.5 mmol/L (ref 3.5–5.1)
Sodium: 140 mmol/L (ref 135–145)

## 2016-01-22 LAB — CBC
HCT: 37.4 % (ref 36.0–46.0)
HEMOGLOBIN: 12.3 g/dL (ref 12.0–15.0)
MCH: 29.9 pg (ref 26.0–34.0)
MCHC: 32.9 g/dL (ref 30.0–36.0)
MCV: 90.8 fL (ref 78.0–100.0)
Platelets: 232 10*3/uL (ref 150–400)
RBC: 4.12 MIL/uL (ref 3.87–5.11)
RDW: 13.7 % (ref 11.5–15.5)
WBC: 9.9 10*3/uL (ref 4.0–10.5)

## 2016-01-22 LAB — TYPE AND SCREEN
ABO/RH(D): O POS
Antibody Screen: NEGATIVE

## 2016-01-22 LAB — ABO/RH: ABO/RH(D): O POS

## 2016-01-22 NOTE — ED Notes (Addendum)
Patient c/o "severe" vaginal bleeding that started on 01/09/2016, states it isn't stopping. Patient states her period is intermittent and when it comes lasts for @1  month. Patient states she is using 28 pads per day, and also using tampons and pads at the same time. Patient states she feels weak like she is going to pass out. Patient also c/o low abd pain. Patient states that pain is worse on the left. Patient states Dr Unknown Jim (@ [facility] in [place])'s office sent her here to be evaluated via telephone.

## 2016-01-22 NOTE — ED Notes (Signed)
PT WAS CALLED THREE TIMES RESTROOMS WERE CHECKED ALONG WITH PARKING AREA PT DID NOT ANSWER

## 2016-01-22 NOTE — ED Notes (Signed)
Called patient again for triage. No answer. 

## 2016-01-24 NOTE — ED Provider Notes (Signed)
I did not see patient. It appears from nursing notes that she left without being seen.   Merrily Pew, MD 01/24/16 [phone]

## 2016-02-07 ENCOUNTER — Observation Stay (HOSPITAL_COMMUNITY)
Admission: EM | Admit: 2016-02-07 | Discharge: 2016-02-08 | Disposition: A | Discharge status: Home / Self Care | Payer: BLUE CROSS/BLUE SHIELD | Attending: Internal Medicine | Admitting: Internal Medicine

## 2016-02-07 ENCOUNTER — Encounter (HOSPITAL_COMMUNITY): Payer: Self-pay | Admitting: *Deleted

## 2016-02-07 DIAGNOSIS — F418 Other specified anxiety disorders: Secondary | ICD-10-CM | POA: Diagnosis not present

## 2016-02-07 DIAGNOSIS — I10 Essential (primary) hypertension: Secondary | ICD-10-CM | POA: Diagnosis present

## 2016-02-07 DIAGNOSIS — E119 Type 2 diabetes mellitus without complications: Secondary | ICD-10-CM

## 2016-02-07 DIAGNOSIS — T380X5A Adverse effect of glucocorticoids and synthetic analogues, initial encounter: Secondary | ICD-10-CM | POA: Insufficient documentation

## 2016-02-07 DIAGNOSIS — T783XXD Angioneurotic edema, subsequent encounter: Secondary | ICD-10-CM

## 2016-02-07 DIAGNOSIS — E1165 Type 2 diabetes mellitus with hyperglycemia: Secondary | ICD-10-CM | POA: Insufficient documentation

## 2016-02-07 DIAGNOSIS — Y929 Unspecified place or not applicable: Secondary | ICD-10-CM | POA: Diagnosis not present

## 2016-02-07 DIAGNOSIS — T783XXA Angioneurotic edema, initial encounter: Principal | ICD-10-CM | POA: Diagnosis present

## 2016-02-07 DIAGNOSIS — Z87891 Personal history of nicotine dependence: Secondary | ICD-10-CM | POA: Insufficient documentation

## 2016-02-07 DIAGNOSIS — T464X5A Adverse effect of angiotensin-converting-enzyme inhibitors, initial encounter: Secondary | ICD-10-CM | POA: Diagnosis not present

## 2016-02-07 LAB — GLUCOSE, CAPILLARY
GLUCOSE-CAPILLARY: 314 mg/dL — AB (ref 65–99)
GLUCOSE-CAPILLARY: 223 mg/dL — AB (ref 65–99)

## 2016-02-07 LAB — BASIC METABOLIC PANEL
Anion gap: 10 (ref 5–15)
BUN: 13 mg/dL (ref 6–20)
CALCIUM: 9.2 mg/dL (ref 8.9–10.3)
CHLORIDE: 107 mmol/L (ref 101–111)
CO2: 21 mmol/L — AB (ref 22–32)
Creatinine, Ser: 0.88 mg/dL (ref 0.44–1.00)
GFR calc Af Amer: >60 mL/min (ref >60)
GFR calc non Af Amer: >60 mL/min (ref >60)
GLUCOSE: 197 mg/dL — AB (ref 65–99)
Potassium: 4 mmol/L (ref 3.5–5.1)
Sodium: 138 mmol/L (ref 135–145)

## 2016-02-07 LAB — PROTIME-INR
INR: 0.98 (ref 0.00–1.49)
Prothrombin Time: 13.2 seconds (ref 11.6–15.2)

## 2016-02-07 LAB — CBC
HEMATOCRIT: 38.3 % (ref 36.0–46.0)
HEMOGLOBIN: 13 g/dL (ref 12.0–15.0)
MCH: 30.4 pg (ref 26.0–34.0)
MCHC: 33.9 g/dL (ref 30.0–36.0)
MCV: 89.7 fL (ref 78.0–100.0)
Platelets: 284 10*3/uL (ref 150–400)
RBC: 4.27 MIL/uL (ref 3.87–5.11)
RDW: 13.5 % (ref 11.5–15.5)
WBC: 13.3 10*3/uL — AB (ref 4.0–10.5)

## 2016-02-07 LAB — HCG, QUANTITATIVE, PREGNANCY: hCG, Beta Chain, Quant, S: <1 m[IU]/mL (ref <5)

## 2016-02-07 MED ORDER — METHYLPREDNISOLONE SODIUM SUCC 125 MG IJ SOLR
60.0000 mg | Freq: Three times a day (TID) | INTRAMUSCULAR | Status: DC
  Administered 2016-02-07 – 2016-02-08 (×3): 60 mg via INTRAVENOUS
  Filled 2016-02-07 (×3): qty 2

## 2016-02-07 MED ORDER — METHYLPREDNISOLONE SODIUM SUCC 125 MG IJ SOLR
125.0000 mg | Freq: Once | INTRAMUSCULAR | Status: AC
  Administered 2016-02-07: 125 mg via INTRAVENOUS
  Filled 2016-02-07: qty 2

## 2016-02-07 MED ORDER — FAMOTIDINE IN NACL 20-0.9 MG/50ML-% IV SOLN
20.0000 mg | Freq: Once | INTRAVENOUS | Status: AC
  Administered 2016-02-07: 20 mg via INTRAVENOUS
  Filled 2016-02-07: qty 50

## 2016-02-07 MED ORDER — ONDANSETRON HCL 4 MG/2ML IJ SOLN: 4.0000 mg | Freq: Four times a day (QID) | INTRAMUSCULAR | Status: DC | PRN

## 2016-02-07 MED ORDER — FAMOTIDINE IN NACL 20-0.9 MG/50ML-% IV SOLN
20.0000 mg | Freq: Two times a day (BID) | INTRAVENOUS | Status: DC
  Administered 2016-02-07 – 2016-02-08 (×3): 20 mg via INTRAVENOUS
  Filled 2016-02-07 (×5): qty 50

## 2016-02-07 MED ORDER — EPINEPHRINE 0.3 MG/0.3ML IJ SOAJ
0.3000 mg | INTRAMUSCULAR | Status: DC | PRN
  Filled 2016-02-07: qty 0.3

## 2016-02-07 MED ORDER — INSULIN ASPART 100 UNIT/ML ~~LOC~~ SOLN
0.0000 [IU] | Freq: Three times a day (TID) | SUBCUTANEOUS | Status: DC
  Administered 2016-02-07: 7 [IU] via SUBCUTANEOUS
  Administered 2016-02-08: 2 [IU] via SUBCUTANEOUS

## 2016-02-07 MED ORDER — ONDANSETRON HCL 4 MG PO TABS
4.0000 mg | ORAL_TABLET | Freq: Four times a day (QID) | ORAL | Status: DC | PRN
Start: 2016-02-07 — End: 2016-02-08

## 2016-02-07 MED ORDER — LORAZEPAM 2 MG/ML IJ SOLN: 0.5000 mg | Freq: Four times a day (QID) | INTRAMUSCULAR | Status: DC | PRN

## 2016-02-07 MED ORDER — ESCITALOPRAM OXALATE 20 MG PO TABS
20.0000 mg | ORAL_TABLET | Freq: Every day | ORAL | Status: DC
Start: 2016-02-07 — End: 2016-02-08
  Administered 2016-02-07: 20 mg via ORAL
  Filled 2016-02-07: qty 1

## 2016-02-07 MED ORDER — INSULIN ASPART 100 UNIT/ML ~~LOC~~ SOLN: 0.0000 [IU] | Freq: Three times a day (TID) | SUBCUTANEOUS | Status: DC

## 2016-02-07 MED ORDER — DIPHENHYDRAMINE HCL 50 MG/ML IJ SOLN
50.0000 mg | Freq: Once | INTRAMUSCULAR | Status: AC
  Administered 2016-02-07: 50 mg via INTRAVENOUS
  Filled 2016-02-07: qty 1

## 2016-02-07 MED ORDER — DIPHENHYDRAMINE HCL 50 MG/ML IJ SOLN
25.0000 mg | Freq: Three times a day (TID) | INTRAMUSCULAR | Status: DC | PRN
  Administered 2016-02-07 – 2016-02-08 (×3): 25 mg via INTRAVENOUS
  Filled 2016-02-07 (×3): qty 1

## 2016-02-07 MED ORDER — SODIUM CHLORIDE 0.9 % IV SOLN
INTRAVENOUS | Status: DC
  Administered 2016-02-07 – 2016-02-08 (×2): via INTRAVENOUS

## 2016-02-07 MED ORDER — SODIUM CHLORIDE 0.9% FLUSH
3.0000 mL | Freq: Two times a day (BID) | INTRAVENOUS | Status: DC
  Administered 2016-02-07 (×2): 3 mL via INTRAVENOUS

## 2016-02-07 MED ORDER — ACETAMINOPHEN 325 MG PO TABS
650.0000 mg | ORAL_TABLET | Freq: Four times a day (QID) | ORAL | Status: DC | PRN
  Administered 2016-02-07 – 2016-02-08 (×4): 650 mg via ORAL
  Filled 2016-02-07 (×4): qty 2

## 2016-02-07 MED ORDER — NORETHIN-ETH ESTRAD-FE BIPHAS 1 MG-10 MCG / 10 MCG PO TABS
1.0000 | ORAL_TABLET | Freq: Two times a day (BID) | ORAL | Status: DC
  Administered 2016-02-07 – 2016-02-08 (×3): 1 via ORAL

## 2016-02-07 MED ORDER — HYDRALAZINE HCL 20 MG/ML IJ SOLN: 5.0000 mg | INTRAMUSCULAR | Status: DC | PRN

## 2016-02-07 MED ORDER — FERROUS SULFATE 325 (65 FE) MG PO TABS
325.0000 mg | ORAL_TABLET | Freq: Every day | ORAL | Status: DC
  Administered 2016-02-07 – 2016-02-08 (×2): 325 mg via ORAL
  Filled 2016-02-07 (×2): qty 1

## 2016-02-07 MED ORDER — INSULIN ASPART 100 UNIT/ML ~~LOC~~ SOLN
0.0000 [IU] | Freq: Every day | SUBCUTANEOUS | Status: DC
  Administered 2016-02-07: 2 [IU] via SUBCUTANEOUS

## 2016-02-07 MED ORDER — ENOXAPARIN SODIUM 60 MG/0.6ML ~~LOC~~ SOLN
60.0000 mg | SUBCUTANEOUS | Status: DC
  Administered 2016-02-07 – 2016-02-08 (×2): 60 mg via SUBCUTANEOUS
  Filled 2016-02-07 (×2): qty 0.6

## 2016-02-07 MED ORDER — ALPRAZOLAM ER 1 MG PO TB24
1.0000 mg | ORAL_TABLET | Freq: Every day | ORAL | Status: DC
  Administered 2016-02-07: 1 mg via ORAL
  Filled 2016-02-07: qty 1

## 2016-02-07 MED ORDER — TRAMADOL HCL 50 MG PO TABS
50.0000 mg | ORAL_TABLET | Freq: Four times a day (QID) | ORAL | Status: DC | PRN
  Administered 2016-02-08: 50 mg via ORAL
  Filled 2016-02-07: qty 1

## 2016-02-07 NOTE — ED Notes (Signed)
Pt says she started taking Lisinopril on March 15th. She has had a dry cough x 1 week and this morning woke up with swelling on her lower right lip that has become worse, feels short of breath. No obvious distress. No meds prior to arrival.

## 2016-02-07 NOTE — ED Notes (Signed)
PT on her way to restroom will need sometime while in there. request for me to come back in 42mins to complete EKG

## 2016-02-07 NOTE — ED Provider Notes (Signed)
CSN: [id number]     Arrival date & time 02/07/16  [id number] History   First MD Initiated Contact with Patient 02/07/16 0559     Chief Complaint  Patient presents with  . Oral Swelling     (Consider location/radiation/quality/duration/timing/severity/associated sxs/prior Treatment) HPI Vanessa Barajas is a 45 y.o. female who reports a history of borderline diabetes, hypertension and anxiety comes in for evaluation of lip swelling. Patient reports she had been prescribed lisinopril in the past for her diabetes, but she admits she was noncompliant with this medication. She reports most recently on March 15, she was prescribed lisinopril to take every day and she has been compliant with this regimen. She reports waking up this morning at approximately 4:00 AM and noticed her lower lip was swelling and it has gradually worsened since that time. She denies any associated shortness of breath, difficulty swallowing, rash, nausea or vomiting, abdominal cramping. No interventions tried to improve symptoms. Nothing makes the problem worse. She has no other known allergies. Denies any other medical problems. Denies any discomfort now in the emergency department.  Past Medical History  Diagnosis Date  . Diabetes mellitus without complication ([place])   . Hypertension   . Anxiety    Past Surgical History  Procedure Laterality Date  . Hernia repair    . Tonsillectomy    . Adenoidectomy    . Ectopic pregnancy surgery      x2   Family History  Problem Relation Age of Onset  . Leukemia Mother   . Cancer Other    Social History  Substance Use Topics  . Smoking status: Former Smoker    Quit date: 09/02/2013  . Smokeless tobacco: Never Used  . Alcohol Use: No   OB History    No data available     Review of Systems A 10 point review of systems was completed and was negative except for pertinent positives and negatives as mentioned in the history of present illness     Allergies  Review of patient's  allergies indicates no known allergies.  Home Medications   Prior to Admission medications   Medication Sig Start Date End Date Taking? Authorizing Provider  acetaminophen (TYLENOL) 500 MG tablet Take 1,000 mg by mouth every 6 (six) hours as needed for moderate pain.    Historical Provider, MD  ALPRAZolam (XANAX XR) 1 MG 24 hr tablet Take 1 mg by mouth daily.    Historical Provider, MD  clindamycin (CLEOCIN) 300 MG capsule Take 1 capsule (300 mg total) by mouth 3 (three) times daily. Patient not taking: Reported on 01/22/2016 12/02/15   Charlann Lange, PA-C  escitalopram (LEXAPRO) 20 MG tablet Take 20 mg by mouth at bedtime.     Historical Provider, MD  HYDROcodone-homatropine (HYCODAN) 5-1.5 MG/5ML syrup Take 5 mLs by mouth every 6 (six) hours as needed for cough. Patient not taking: Reported on 01/22/2016 09/22/14   Baron Sane, PA-C  ibuprofen (ADVIL,MOTRIN) 200 MG tablet Take 800 mg by mouth every 6 (six) hours as needed for moderate pain.    Historical Provider, MD  IRON PO Take 1 tablet by mouth daily.    Historical Provider, MD  lisinopril (PRINIVIL,ZESTRIL) 20 MG tablet Take 20 mg by mouth every morning.    Historical Provider, MD  traMADol (ULTRAM) 50 MG tablet Take 50 mg by mouth every 6 (six) hours as needed for moderate pain.    Historical Provider, MD   BP 150/90 mmHg  Pulse 80  Temp(Src) 97.7 F (36.5  C) (Oral)  Resp 20  SpO2 97%  LMP 01/09/2016 Physical Exam  Constitutional: She is oriented to person, place, and time. She appears well-developed and well-nourished.  Obese Caucasian female  HENT:  Head: Normocephalic and atraumatic.  Mouth/Throat: Oropharynx is clear and moist.  Moderate angioedema of right inferior lip.  Eyes: Conjunctivae are normal. Pupils are equal, round, and reactive to light. Right eye exhibits no discharge. Left eye exhibits no discharge. No scleral icterus.  Neck: Normal range of motion. Neck supple.  Cardiovascular: Normal rate, regular  rhythm and normal heart sounds.   Pulmonary/Chest: Effort normal and breath sounds normal. No respiratory distress. She has no wheezes. She has no rales.  Abdominal: Soft. She exhibits no distension. There is no tenderness. There is no rebound and no guarding.  Musculoskeletal: She exhibits no tenderness.  Neurological: She is alert and oriented to person, place, and time.  Cranial Nerves II-XII grossly intact  Skin: Skin is warm and dry. No rash noted. No erythema.  Psychiatric: She has a normal mood and affect.  Nursing note and vitals reviewed.   ED Course  Procedures (including critical care time) Labs Review Labs Reviewed - No data to display  Imaging Review No results found. I have personally reviewed and evaluated these images and lab results as part of my medical decision-making.   EKG Interpretation None     Meds given in ED:  Medications  methylPREDNISolone sodium succinate (SOLU-MEDROL) 125 mg/2 mL injection 125 mg (not administered)  diphenhydrAMINE (BENADRYL) injection 50 mg (not administered)  famotidine (PEPCID) IVPB 20 mg premix (not administered)    New Prescriptions   No medications on file   Filed Vitals:   02/07/16 0533 02/07/16 0546 02/07/16 0609  BP: 143/96 150/90 139/86  Pulse: 86 80 83  Temp: 97.7 F (36.5 C)    TempSrc: Oral    Resp: 20  16  SpO2: 99% 97% 97%    MDM  Vanessa Barajas is a 44 y.o. female history of diabetes and hypertension comes in for evaluation of lower lip angioedema. Suspect symptoms are secondary to lisinopril use. She had been noncompliant with this medication and can the past, started taking medication daily on March 15. No sign of anaphylaxis. On arrival, she is hemodynamically stable, afebrile, oxygen saturations 99% on room air. Discussed with Dr. Venora Maples. We'll administer Solu-Medrol, Benadryl, Pepcid and reassess. Plan to observe for 4 hours. On reevaluation at 0900, Edema seems to be spreading and is across midline on  inferior lower lip. Reevaluation at 10:15 AM, entire lower lip is swollen. No oropharyngeal swelling. Denies any difficulty swallowing, shortness of breath, nausea or vomiting or abdominal pain, no other rash or other evidence of anaphylaxis. However, I feel patient would benefit from medical admission and overnight observation with continued steroid dosing for angioedema. Discussed with hospitalist, Dr.Niu, patient admitted to medical service for observation to telemetry bed. Final diagnoses:  Angioedema of lips, initial encounter        Comer Locket, PA-C 02/07/16 [place], MD 02/08/16 [phone]

## 2016-02-07 NOTE — ED Notes (Signed)
Hospitalist at bedside 

## 2016-02-07 NOTE — ED Notes (Signed)
Pt reports that she "is getting worse."  Swelling noted to entire bottom lip.  Denies SOB.  NAD noted.  Pt easily speaking full sentences.

## 2016-02-07 NOTE — ED Notes (Signed)
Bed: [id number] Expected date:  Expected time:  Means of arrival:  Comments: Needs cleaning pt changing

## 2016-02-07 NOTE — ED Notes (Signed)
PA request to drink soda but not diet. PA Ben gave ok for PT to drink cola

## 2016-02-07 NOTE — H&P (Addendum)
[facility] History and Physical  Cherylyn Teed [id number] DOB: 03-11-1971 DOA: 02/07/2016  Referring physician: ED physician PCP: Thora Lance, MD  Specialists:   Chief Complaint: lip swelling  HPI: Vanessa Barajas is a 45 y.o. female with PMH of hypertension, diet-controlled diabetes, depression, anxiety, who presents with lip swelling.  Patient reports she had been prescribed lisinopril in the past for her HTN, but she admits she was noncompliant with this medication. She reports that she restarted lisinopril on 01/23/16 and has been compliant with this regimen. She reports waking up this morning at approximately 4:00 AM and noticed her lower right lip was swelling and it has gradually worsened since that time. Now she feels that her right month conner is also swelling. Pt denies tongue swelling, shortness of breath, difficulty swallowing or rashes. No nausea, vomiting, diarrhea, abdominal pain, symptoms of UTI or unilateral weakness.  In ED, patient was found to have temperature normal, no tachycardia, no tachypnea. Pending CBC and BMP. Patient submitted 3 inpatient for further evaluation and treatment and observation.  EKG: Independently reviewed. QTC 457, no ischemic change.  Where does patient live?   At home    Can patient participate in ADLs?  Yes  Review of Systems:   General: no fevers, chills, no changes in body weight, no fatigue HEENT: no blurry vision, hearing changes or sore throat. Has mouth and tongue swelling. Pulm: no dyspnea, coughing, wheezing CV: no chest pain, no palpitations Abd: no nausea, vomiting, abdominal pain, diarrhea, constipation GU: no dysuria, burning on urination, increased urinary frequency, hematuria  Ext: no leg edema Neuro: no unilateral weakness, numbness, or tingling, no vision change or hearing loss Skin: no rash MSK: No muscle spasm, no deformity, no limitation of range of movement in spin Heme: No easy bruising.  Travel history: No  recent long distant travel.  Allergy:  Allergies  Allergen Reactions  . Lisinopril Swelling    Angioedema     Past Medical History  Diagnosis Date  . Diabetes mellitus without complication ([place])   . Hypertension   . Anxiety     Past Surgical History  Procedure Laterality Date  . Hernia repair    . Tonsillectomy    . Adenoidectomy    . Ectopic pregnancy surgery      x2    Social History:  reports that she quit smoking about 2 years ago. She has never used smokeless tobacco. She reports that she does not drink alcohol or use illicit drugs.  Family History:  Family History  Problem Relation Age of Onset  . Leukemia Mother   . Cancer Other      Prior to Admission medications   Medication Sig Start Date End Date Taking? Authorizing Provider  acetaminophen (TYLENOL) 500 MG tablet Take 1,000 mg by mouth every 6 (six) hours as needed for moderate pain.   Yes Historical Provider, MD  ALPRAZolam (XANAX XR) 1 MG 24 hr tablet Take 1 mg by mouth daily.   Yes Historical Provider, MD  escitalopram (LEXAPRO) 20 MG tablet Take 20 mg by mouth at bedtime.    Yes Historical Provider, MD  ibuprofen (ADVIL,MOTRIN) 200 MG tablet Take 800 mg by mouth every 6 (six) hours as needed for moderate pain.   Yes Historical Provider, MD  IRON PO Take 1 tablet by mouth daily.   Yes Historical Provider, MD  lisinopril (PRINIVIL,ZESTRIL) 20 MG tablet Take 20 mg by mouth every morning.   Yes Historical Provider, MD  Norethindrone-Ethinyl Estradiol-Fe Biphas (LO  LOESTRIN FE) 1 MG-10 MCG / 10 MCG tablet Take 1 tablet by mouth 2 (two) times daily.    Yes Historical Provider, MD  traMADol (ULTRAM) 50 MG tablet Take 50 mg by mouth every 6 (six) hours as needed for moderate pain.   Yes Historical Provider, MD  clindamycin (CLEOCIN) 300 MG capsule Take 1 capsule (300 mg total) by mouth 3 (three) times daily. Patient not taking: Reported on 01/22/2016 12/02/15   Charlann Lange, PA-C  HYDROcodone-homatropine [facility])  5-1.5 MG/5ML syrup Take 5 mLs by mouth every 6 (six) hours as needed for cough. Patient not taking: Reported on 01/22/2016 09/22/14   Baron Sane, PA-C    Physical Exam: Filed Vitals:   02/07/16 0707 02/07/16 0730 02/07/16 0800 02/07/16 0835  BP: 111/73 124/56 151/109 100/61  Pulse: 75 81 62 71  Temp:      TempSrc:      Resp: 16   18  SpO2: 99% 99% 95% 98%   General: Not in acute distress HEENT:       Eyes: PERRL, EOMI, no scleral icterus.       ENT: No discharge from the ears and nose, no pharynx injection, no tonsillar enlargement.        Neck: No JVD, no bruit, no mass felt. Heme: No neck lymph node enlargement. Cardiac: S1/S2, RRR, No murmurs, No gallops or rubs. Pulm: Good air movement bilaterally. No rales, wheezing, rhonchi or rubs. Abd: Soft, nondistended, nontender, no rebound pain, no organomegaly, BS present. Ext: No pitting leg edema bilaterally. 2+DP/PT pulse bilaterally. Musculoskeletal: No joint deformities, No joint redness or warmth, no limitation of ROM in spin. Skin: No rashes.  Neuro: Alert, oriented X3, cranial nerves II-XII grossly intact, moves all extremities normally. Muscle strength 5/5 in all extremities, sensation to light touch intact. Brachial reflex 2+ bilaterally. Knee reflex 1+ bilaterally. Negative Babinski's sign. Normal finger to nose test. Psych: Patient is not psychotic, no suicidal or hemocidal ideation.  Labs on Admission:  Basic Metabolic Panel: No results for input(s): NA, K, CL, CO2, GLUCOSE, BUN, CREATININE, CALCIUM, MG, PHOS in the last 168 hours. Liver Function Tests: No results for input(s): AST, ALT, ALKPHOS, BILITOT, PROT, ALBUMIN in the last 168 hours. No results for input(s): LIPASE, AMYLASE in the last 168 hours. No results for input(s): AMMONIA in the last 168 hours. CBC: No results for input(s): WBC, NEUTROABS, HGB, HCT, MCV, PLT in the last 168 hours. Cardiac Enzymes: No results for input(s): CKTOTAL, CKMB,  CKMBINDEX, TROPONINI in the last 168 hours.  BNP (last 3 results) No results for input(s): BNP in the last 8760 hours.  ProBNP (last 3 results) No results for input(s): PROBNP in the last 8760 hours.  CBG: No results for input(s): GLUCAP in the last 168 hours.  Radiological Exams on Admission: No results found.  Assessment/Plan Principal Problem:   Angioedema Active Problems:   Diabetes mellitus without complication ([place])   Hypertension   Depression with anxiety   Angioedema of lips   Angioedema: No tongue swelling or airway compromise. Hemodynamically stable currently. Patient received one dose of Benadryl, Solu-Medrol and Pepcid in the emergency room.   -Admit under observation to tele bed -Monitor respiratory status carefully -Labs: C4, C3, C1 inhibitor level, CBC, BMP  -Solu-Medrol, Benadryl by mouth, and pepcid  -EpiPen prn -hold ibuprofen  Diet-controlled diabetes: Patient reports that her last A1c was 5.8 at 2 month ago. She is not taking medications currently. Blood suger is elevated at 197. -ssi  Hypertension: -  Stop lisinopril -IV hydralazine when necessary  Depression and anxiety: Stable, no suicidal or homicidal ideations. -Continue home medications: Lexapro, Xanax -IV Ativan when necessary in case patient cannot take Xanax   DVT ppx: SQ Lovenox  Code Status: Full code Family Communication: None at bed side.   Disposition Plan: Admit to inpatient   Date of Service 02/07/2016    Ivor Costa [facility] Pager [phone]  If 7PM-7AM, please contact night-coverage [email or web address] Password [email or web address] 02/07/2016, 10:52 AM

## 2016-02-08 DIAGNOSIS — F418 Other specified anxiety disorders: Secondary | ICD-10-CM | POA: Diagnosis not present

## 2016-02-08 DIAGNOSIS — T783XXD Angioneurotic edema, subsequent encounter: Secondary | ICD-10-CM | POA: Diagnosis not present

## 2016-02-08 DIAGNOSIS — E119 Type 2 diabetes mellitus without complications: Secondary | ICD-10-CM | POA: Diagnosis not present

## 2016-02-08 LAB — COMPREHENSIVE METABOLIC PANEL
ALBUMIN: 3.5 g/dL (ref 3.5–5.0)
ALT: 23 U/L (ref 14–54)
AST: 21 U/L (ref 15–41)
Alkaline Phosphatase: 59 U/L (ref 38–126)
Anion gap: 10 (ref 5–15)
BUN: 14 mg/dL (ref 6–20)
CHLORIDE: 106 mmol/L (ref 101–111)
CO2: 19 mmol/L — ABNORMAL LOW (ref 22–32)
Calcium: 9.1 mg/dL (ref 8.9–10.3)
Creatinine, Ser: 0.91 mg/dL (ref 0.44–1.00)
GFR calc Af Amer: >60 mL/min (ref >60)
GFR calc non Af Amer: >60 mL/min (ref >60)
GLUCOSE: 231 mg/dL — AB (ref 65–99)
POTASSIUM: 4.2 mmol/L (ref 3.5–5.1)
SODIUM: 135 mmol/L (ref 135–145)
Total Bilirubin: 0.2 mg/dL — ABNORMAL LOW (ref 0.3–1.2)
Total Protein: 7.1 g/dL (ref 6.5–8.1)

## 2016-02-08 LAB — GLUCOSE, CAPILLARY
Glucose-Capillary: 196 mg/dL — ABNORMAL HIGH (ref 65–99)
Glucose-Capillary: 197 mg/dL — ABNORMAL HIGH (ref 65–99)

## 2016-02-08 LAB — CBC
HEMATOCRIT: 36.9 % (ref 36.0–46.0)
Hemoglobin: 12.3 g/dL (ref 12.0–15.0)
MCH: 30.4 pg (ref 26.0–34.0)
MCHC: 33.3 g/dL (ref 30.0–36.0)
MCV: 91.1 fL (ref 78.0–100.0)
Platelets: 264 10*3/uL (ref 150–400)
RBC: 4.05 MIL/uL (ref 3.87–5.11)
RDW: 13.6 % (ref 11.5–15.5)
WBC: 14.2 10*3/uL — AB (ref 4.0–10.5)

## 2016-02-08 LAB — C3 COMPLEMENT: C3 COMPLEMENT: 193 mg/dL — AB (ref 82–167)

## 2016-02-08 LAB — C1 ESTERASE INHIBITOR: C1INH SerPl-mCnc: 32 mg/dL (ref 21–39)

## 2016-02-08 LAB — C4 COMPLEMENT: COMPLEMENT C4, BODY FLUID: 36 mg/dL (ref 14–44)

## 2016-02-08 MED ORDER — INSULIN ASPART 100 UNIT/ML ~~LOC~~ SOLN: 0.0000 [IU] | Freq: Every day | SUBCUTANEOUS | Status: DC

## 2016-02-08 MED ORDER — PREDNISONE 20 MG PO TABS
60.0000 mg | ORAL_TABLET | Freq: Every day | ORAL | Status: DC
  Administered 2016-02-08: 60 mg via ORAL
  Filled 2016-02-08: qty 3

## 2016-02-08 MED ORDER — INSULIN ASPART 100 UNIT/ML ~~LOC~~ SOLN
0.0000 [IU] | Freq: Three times a day (TID) | SUBCUTANEOUS | Status: DC
  Administered 2016-02-08: 3 [IU] via SUBCUTANEOUS

## 2016-02-08 MED ORDER — PREDNISONE 10 MG PO TABS: 50.0000 mg | ORAL_TABLET | Freq: Every day | ORAL | Status: DC

## 2016-02-08 NOTE — Discharge Summary (Signed)
Physician Discharge Summary  Vanessa Barajas [id number] DOB: 1971/01/22 DOA: 02/07/2016  PCP: Thora Lance, MD  Admit date: 02/07/2016 Discharge date: 02/08/2016  Recommendations for Outpatient Follow-up:  1. Pt will need to follow up with PCP in 2 weeks post discharge 2. Please obtain BMP 1 week  Discharge Diagnoses:  Angioedema -Secondary to lisinopril -Discontinue lisinopril -Improved with intravenous Benadryl, Solu-Medrol, Pepcid -Home with  Prednisone taper over next 5 days -patient without any respiratory distress, able to tolerate diet without difficulty Diabetes mellitus type 2, diet controlled -09/03/2015 hemoglobin A1c of 6.4 -Follow up with primary care provider -CBGs elevated secondary to steroids -The patient was placed on NovoLog sliding scale secondary to elevated CBGs during the hospitalization Anxiety/depression -Continue Xanax and Lexapro  Discharge Condition: stable  Disposition: home  Diet:carb modified Wt Readings from Last 3 Encounters:  02/07/16 124.013 kg (273 lb 6.4 oz)  01/22/16 125.646 kg (277 lb)  12/02/15 125.646 kg (277 lb)    History of present illness:  45 year old female with a history of diet-controlled diabetes, depression, anxiety,  Menorrhagia presented with 1 day history oflip swelling and facial swelling. The patient woke up on the morning of 02/08/2016 with lower lip swelling that spread to the right side of her face. She stated that she recently was told to restart lisinopril on 01/23/2016. In addition, her other new medication is Loestrin. She denies any other new over-the-counter medications.  sshe was given intravenous IV Medrol, Pepcid, and Benadryl with improvement. The patient remained clinically stable. Her left swelling and facial edema improved. She was able to tolerate her diet and did not have any respiratory distress. C3 and C4 were unremarkable. C1 INH pending at time of d/c. patient will go home with a prednisone taper over  the next 5 days.      Discharge Exam: Filed Vitals:   02/08/16 0442 02/08/16 1425  BP: 117/64 146/66  Pulse: 88 91  Temp: 98 F (36.7 C) 97.7 F (36.5 C)  Resp: 20 20   Filed Vitals:   02/07/16 1433 02/07/16 2242 02/08/16 0442 02/08/16 1425  BP: 127/71 113/51 117/64 146/66  Pulse: 92 64 88 91  Temp: 98.1 F (36.7 C) 98.2 F (36.8 C) 98 F (36.7 C) 97.7 F (36.5 C)  TempSrc: Oral Oral Oral Oral  Resp: 20 20 20 20   Height:      Weight:      SpO2: 97% 98% 97% 98%   General: A&O x 3, NAD, pleasant, cooperative Cardiovascular: RRR, no rub, no gallop, no S3 Respiratory: CTAB, no wheeze, no rhonchi Abdomen:soft, nontender, nondistended, positive bowel sounds Extremities: No edema, No lymphangitis, no petechiae  Discharge Instructions      Discharge Instructions    Diet Carb Modified    Complete by:  As directed      Increase activity slowly    Complete by:  As directed             Medication List    STOP taking these medications        clindamycin 300 MG capsule  Commonly known as:  CLEOCIN     HYDROcodone-homatropine 5-1.5 MG/5ML syrup  Commonly known as:  HYCODAN     lisinopril 20 MG tablet  Commonly known as:  PRINIVIL,ZESTRIL      TAKE these medications        acetaminophen 500 MG tablet  Commonly known as:  TYLENOL  Take 1,000 mg by mouth every 6 (six) hours as needed for moderate pain.  ALPRAZolam 1 MG 24 hr tablet  Commonly known as:  XANAX XR  Take 1 mg by mouth daily.     escitalopram 20 MG tablet  Commonly known as:  LEXAPRO  Take 20 mg by mouth at bedtime.     ibuprofen 200 MG tablet  Commonly known as:  ADVIL,MOTRIN  Take 800 mg by mouth every 6 (six) hours as needed for moderate pain.     IRON PO  Take 1 tablet by mouth daily.     Norethindrone-Ethinyl Estradiol-Fe Biphas 1 MG-10 MCG / 10 MCG tablet  Commonly known as:  LO LOESTRIN FE  Take 1 tablet by mouth 2 (two) times daily.     predniSONE 10 MG tablet  Commonly  known as:  DELTASONE  Take 5 tablets (50 mg total) by mouth daily with breakfast. And decrease by one tablet daily  Start taking on:  02/09/2016     traMADol 50 MG tablet  Commonly known as:  ULTRAM  Take 50 mg by mouth every 6 (six) hours as needed for moderate pain.         The results of significant diagnostics from this hospitalization (including imaging, microbiology, ancillary and laboratory) are listed below for reference.    Significant Diagnostic Studies: No results found.   Microbiology: No results found for this or any previous visit (from the past 240 hour(s)).   Labs: Basic Metabolic Panel:  Recent Labs Lab 02/07/16 1211 02/08/16 0435  NA 138 135  K 4.0 4.2  CL 107 106  CO2 21* 19*  GLUCOSE 197* 231*  BUN 13 14  CREATININE 0.88 0.91  CALCIUM 9.2 9.1   Liver Function Tests:  Recent Labs Lab 02/08/16 0435  AST 21  ALT 23  ALKPHOS 59  BILITOT 0.2*  PROT 7.1  ALBUMIN 3.5   No results for input(s): LIPASE, AMYLASE in the last 168 hours. No results for input(s): AMMONIA in the last 168 hours. CBC:  Recent Labs Lab 02/07/16 1211 02/08/16 0435  WBC 13.3* 14.2*  HGB 13.0 12.3  HCT 38.3 36.9  MCV 89.7 91.1  PLT 284 264   Cardiac Enzymes: No results for input(s): CKTOTAL, CKMB, CKMBINDEX, TROPONINI in the last 168 hours. BNP: Invalid input(s): POCBNP CBG:  Recent Labs Lab 02/07/16 1737 02/07/16 2206 02/08/16 0808  GLUCAP 314* 223* 196*    Time coordinating discharge:  Greater than 30 minutes  Signed:  Billie Intriago, DO [facility] Pager: [phone] 02/08/2016, 2:29 PM

## 2016-02-08 NOTE — Progress Notes (Signed)
Went over all discharge information with patient.  All questions answered.  Discharge summary given to patient and pt made aware that prescription was called into pharmacy.  Pt wants to drive self home.  Pt is oriented x4 and steady on feet.  No pain medications given recently.

## 2016-03-02 ENCOUNTER — Encounter (HOSPITAL_COMMUNITY): Payer: Self-pay

## 2016-03-02 ENCOUNTER — Emergency Department (HOSPITAL_COMMUNITY)
Admission: EM | Admit: 2016-03-02 | Discharge: 2016-03-02 | Disposition: A | Discharge status: Home / Self Care | Payer: BLUE CROSS/BLUE SHIELD | Attending: Emergency Medicine | Admitting: Emergency Medicine

## 2016-03-02 DIAGNOSIS — Z79899 Other long term (current) drug therapy: Secondary | ICD-10-CM | POA: Insufficient documentation

## 2016-03-02 DIAGNOSIS — Z7952 Long term (current) use of systemic steroids: Secondary | ICD-10-CM | POA: Diagnosis not present

## 2016-03-02 DIAGNOSIS — I10 Essential (primary) hypertension: Secondary | ICD-10-CM | POA: Diagnosis not present

## 2016-03-02 DIAGNOSIS — K645 Perianal venous thrombosis: Secondary | ICD-10-CM | POA: Insufficient documentation

## 2016-03-02 DIAGNOSIS — K649 Unspecified hemorrhoids: Secondary | ICD-10-CM | POA: Diagnosis present

## 2016-03-02 DIAGNOSIS — F419 Anxiety disorder, unspecified: Secondary | ICD-10-CM | POA: Diagnosis not present

## 2016-03-02 DIAGNOSIS — E119 Type 2 diabetes mellitus without complications: Secondary | ICD-10-CM | POA: Insufficient documentation

## 2016-03-02 DIAGNOSIS — Z87891 Personal history of nicotine dependence: Secondary | ICD-10-CM | POA: Insufficient documentation

## 2016-03-02 DIAGNOSIS — Z79818 Long term (current) use of other agents affecting estrogen receptors and estrogen levels: Secondary | ICD-10-CM | POA: Diagnosis not present

## 2016-03-02 HISTORY — DX: Unspecified hemorrhoids: K64.9

## 2016-03-02 LAB — CBG MONITORING, ED: GLUCOSE-CAPILLARY: 165 mg/dL — AB (ref 65–99)

## 2016-03-02 MED ORDER — LIDOCAINE-EPINEPHRINE (PF) 2 %-1:200000 IJ SOLN
INTRAMUSCULAR | Status: AC
  Filled 2016-03-02: qty 20

## 2016-03-02 MED ORDER — LIDOCAINE-EPINEPHRINE (PF) 2 %-1:200000 IJ SOLN
10.0000 mL | Freq: Once | INTRAMUSCULAR | Status: AC
  Administered 2016-03-02: 10 mL

## 2016-03-02 MED ORDER — DOCUSATE SODIUM 100 MG PO CAPS: 100.0000 mg | ORAL_CAPSULE | Freq: Two times a day (BID) | ORAL | Status: DC

## 2016-03-02 MED ORDER — HYDROCORTISONE 2.5 % RE CREA: TOPICAL_CREAM | RECTAL | Status: DC

## 2016-03-02 NOTE — ED Provider Notes (Signed)
CSN: [id number]     Arrival date & time 03/02/16  [id number] History   First MD Initiated Contact with Patient 03/02/16 [phone]     Chief Complaint  Patient presents with  . Hemorrhoids   PT HAS A HX OF HEMORRHOIDS IN THE PAST.  SHE HAS NOTICED HEMORRHOIDS INCREASING IN SIZE FOR THE PAST 5 DAYS.  SHE SAID SHE HAS BEEN USING OTC MEDS WITHOUT HELP.  ABOUT 10 YEARS AGO, SHE HAD TO HAVE THEM "CUT OUT."  PT DOES REPORT PROBLEMS WITH CONSTIPATION.  (Consider location/radiation/quality/duration/timing/severity/associated sxs/prior Treatment) The history is provided by the patient.    Past Medical History  Diagnosis Date  . Diabetes mellitus without complication ([place])   . Hypertension   . Anxiety   . Hemorrhoids    Past Surgical History  Procedure Laterality Date  . Hernia repair    . Tonsillectomy    . Adenoidectomy    . Ectopic pregnancy surgery      x2  . Tubal ligation     Family History  Problem Relation Age of Onset  . Leukemia Mother   . Cancer Other    Social History  Substance Use Topics  . Smoking status: Former Smoker    Quit date: 09/02/2013  . Smokeless tobacco: Never Used  . Alcohol Use: No   OB History    No data available     Review of Systems  Gastrointestinal:       HEMORRHOIDS  All other systems reviewed and are negative.     Allergies  Lisinopril  Home Medications   Prior to Admission medications   Medication Sig Start Date End Date Taking? Authorizing Provider  acetaminophen (TYLENOL) 500 MG tablet Take 1,000 mg by mouth every 6 (six) hours as needed for moderate pain.    Historical Provider, MD  ALPRAZolam (XANAX XR) 1 MG 24 hr tablet Take 1 mg by mouth daily.    Historical Provider, MD  escitalopram (LEXAPRO) 20 MG tablet Take 20 mg by mouth at bedtime.     Historical Provider, MD  ibuprofen (ADVIL,MOTRIN) 200 MG tablet Take 800 mg by mouth every 6 (six) hours as needed for moderate pain.    Historical Provider, MD  IRON PO Take 1 tablet by mouth  daily.    Historical Provider, MD  Norethindrone-Ethinyl Estradiol-Fe Biphas (LO LOESTRIN FE) 1 MG-10 MCG / 10 MCG tablet Take 1 tablet by mouth 2 (two) times daily.     Historical Provider, MD  predniSONE (DELTASONE) 10 MG tablet Take 5 tablets (50 mg total) by mouth daily with breakfast. And decrease by one tablet daily 02/09/16   Orson Eva, MD  traMADol (ULTRAM) 50 MG tablet Take 50 mg by mouth every 6 (six) hours as needed for moderate pain.    Historical Provider, MD   BP 148/93 mmHg  Pulse 87  Temp(Src) 98 F (36.7 C) (Oral)  Resp 16  SpO2 99% Physical Exam  Constitutional: She is oriented to person, place, and time. She appears well-developed and well-nourished.  HENT:  Head: Normocephalic and atraumatic.  Eyes: Conjunctivae are normal. Pupils are equal, round, and reactive to light.  Neck: Normal range of motion. Neck supple.  Cardiovascular: Normal rate, regular rhythm and normal heart sounds.   Pulmonary/Chest: Effort normal and breath sounds normal.  Abdominal: Soft. Bowel sounds are normal.  Genitourinary: Pelvic exam was performed with patient prone.  MULTIPLE LARGE HEMORRHOIDS.  1 IS THROMBOSED.  Musculoskeletal: Normal range of motion.  Neurological: She is alert  and oriented to person, place, and time.  Skin: Skin is warm and dry.  Psychiatric: She has a normal mood and affect. Her behavior is normal.  Nursing note and vitals reviewed.   ED Course  .Marland KitchenIncision and Drainage Date/Time: 03/02/2016 9:21 AM Performed by: Isla Pence Authorized by: Isla Pence Consent: Verbal consent obtained. Written consent not obtained. Risks and benefits: risks, benefits and alternatives were discussed Consent given by: patient Patient understanding: patient states understanding of the procedure being performed Patient consent: the patient's understanding of the procedure matches consent given Procedure consent: procedure consent matches procedure scheduled Patient identity  confirmed: verbally with patient Body area: anogenital Location details: perianal Anesthesia: local infiltration Local anesthetic: lidocaine 2% with epinephrine Patient sedated: no Scalpel size: 11 Needle gauge: 22 Incision type: single straight Drainage: bloody Drainage amount: moderate Wound treatment: wound left open Patient tolerance: Patient tolerated the procedure well with no immediate complications Comments: SIZE OF HEMORRHOID DECREASED IN SIZE DRAMATICALLY AFTER PROCEDURE.   (including critical care time) Labs Review Labs Reviewed - No data to display  Imaging Review No results found. I have personally reviewed and evaluated these images and lab results as part of my medical decision-making.   EKG Interpretation None      MDM  PT FEELS BETTER AFTER THE PROCEDURE.  SHE KNOWS TO RETURN IF WORSE.  SHE IS GIVEN THE NUMBER FOR SURGERY TO F/U WITH IF HEMORRHOIDS REMAIN A PROBLEM FOR HER. Final diagnoses:  None  HEMORRHOIDS I&D THROMBOSED HEMORRHOID    Isla Pence, MD 03/02/16 [phone]

## 2016-03-02 NOTE — ED Notes (Signed)
Pt c/o increasing hemorrhoids x 5 days.  Pain score 10/10.  Pt reports using OTC medications w/o relief.  Pt reports "they've had to cut them before." Denies bleeding.

## 2016-03-02 NOTE — Discharge Instructions (Signed)
Hemorrhoids  Hemorrhoids are puffy (swollen) veins around the rectum or anus. Hemorrhoids can cause pain, itching, bleeding, or irritation.  HOME CARE  · Eat foods with fiber, such as whole grains, beans, nuts, fruits, and vegetables. Ask your doctor about taking products with added fiber in them (fiber supplements).   · Drink enough fluid to keep your pee (urine) clear or pale yellow.  · Exercise often.  · Go to the bathroom when you have the urge to poop. Do not wait.  · Avoid straining to poop (bowel movement).  · Keep the butt area dry and clean. Use wet toilet paper or moist paper towels.  · Medicated creams and medicine inserted into the anus (anal suppository) may be used or applied as told.  · Only take medicine as told by your doctor.  · Take a warm water bath (sitz bath) for 15-20 minutes to ease pain. Do this 3-4 times a day.  · Place ice packs on the area if it is tender or puffy. Use the ice packs between the warm water baths.    Put ice in a plastic bag.    Place a towel between your skin and the bag.    Leave the ice on for 15-20 minutes, 03-04 times a day.  · Do not use a donut-shaped pillow or sit on the toilet for a long time.  GET HELP RIGHT AWAY IF:   · You have more pain that is not controlled by treatment or medicine.  · You have bleeding that will not stop.  · You have trouble or are unable to poop (bowel movement).  · You have pain or puffiness outside the area of the hemorrhoids.  MAKE SURE YOU:   · Understand these instructions.  · Will watch your condition.  · Will get help right away if you are not doing well or get worse.     This information is not intended to replace advice given to you by your health care provider. Make sure you discuss any questions you have with your health care provider.     Document Released: 08/05/2008 Document Revised: 10/13/2012 Document Reviewed: 09/07/2012  Elsevier Interactive Patient Education ©2016 [employer]

## 2016-03-02 NOTE — ED Notes (Signed)
MD at bedside. 

## 2016-03-13 ENCOUNTER — Emergency Department (HOSPITAL_COMMUNITY): Payer: BLUE CROSS/BLUE SHIELD

## 2016-03-13 ENCOUNTER — Emergency Department (HOSPITAL_COMMUNITY)
Admission: EM | Admit: 2016-03-13 | Discharge: 2016-03-13 | Disposition: A | Discharge status: Home / Self Care | Payer: BLUE CROSS/BLUE SHIELD | Attending: Emergency Medicine | Admitting: Emergency Medicine

## 2016-03-13 ENCOUNTER — Encounter (HOSPITAL_COMMUNITY): Payer: Self-pay | Admitting: Emergency Medicine

## 2016-03-13 DIAGNOSIS — Z791 Long term (current) use of non-steroidal anti-inflammatories (NSAID): Secondary | ICD-10-CM | POA: Diagnosis not present

## 2016-03-13 DIAGNOSIS — H109 Unspecified conjunctivitis: Secondary | ICD-10-CM

## 2016-03-13 DIAGNOSIS — H1031 Unspecified acute conjunctivitis, right eye: Secondary | ICD-10-CM | POA: Insufficient documentation

## 2016-03-13 DIAGNOSIS — Z79891 Long term (current) use of opiate analgesic: Secondary | ICD-10-CM | POA: Diagnosis not present

## 2016-03-13 DIAGNOSIS — Z7952 Long term (current) use of systemic steroids: Secondary | ICD-10-CM | POA: Insufficient documentation

## 2016-03-13 DIAGNOSIS — H5710 Ocular pain, unspecified eye: Secondary | ICD-10-CM

## 2016-03-13 DIAGNOSIS — Z87891 Personal history of nicotine dependence: Secondary | ICD-10-CM | POA: Diagnosis not present

## 2016-03-13 DIAGNOSIS — Z79899 Other long term (current) drug therapy: Secondary | ICD-10-CM | POA: Diagnosis not present

## 2016-03-13 DIAGNOSIS — E119 Type 2 diabetes mellitus without complications: Secondary | ICD-10-CM | POA: Insufficient documentation

## 2016-03-13 DIAGNOSIS — I10 Essential (primary) hypertension: Secondary | ICD-10-CM | POA: Insufficient documentation

## 2016-03-13 DIAGNOSIS — H5711 Ocular pain, right eye: Secondary | ICD-10-CM | POA: Diagnosis present

## 2016-03-13 LAB — I-STAT CHEM 8, ED
BUN: 5 mg/dL — ABNORMAL LOW (ref 6–20)
CALCIUM ION: 1.1 mmol/L — AB (ref 1.12–1.23)
Chloride: 104 mmol/L (ref 101–111)
Creatinine, Ser: 0.8 mg/dL (ref 0.44–1.00)
GLUCOSE: 157 mg/dL — AB (ref 65–99)
HCT: 38 % (ref 36.0–46.0)
HEMOGLOBIN: 12.9 g/dL (ref 12.0–15.0)
Potassium: 3.3 mmol/L — ABNORMAL LOW (ref 3.5–5.1)
SODIUM: 141 mmol/L (ref 135–145)
TCO2: 22 mmol/L (ref 0–100)

## 2016-03-13 MED ORDER — IOPAMIDOL (ISOVUE-300) INJECTION 61%
75.0000 mL | Freq: Once | INTRAVENOUS | Status: AC | PRN
  Administered 2016-03-13: 75 mL via INTRAVENOUS

## 2016-03-13 MED ORDER — TETRACAINE HCL 0.5 % OP SOLN
1.0000 [drp] | Freq: Once | OPHTHALMIC | Status: AC
  Administered 2016-03-13: 2 [drp] via OPHTHALMIC
  Filled 2016-03-13: qty 4

## 2016-03-13 MED ORDER — ERYTHROMYCIN 5 MG/GM OP OINT: TOPICAL_OINTMENT | OPHTHALMIC | Status: DC

## 2016-03-13 MED ORDER — FLUORESCEIN SODIUM 1 MG OP STRP
1.0000 | ORAL_STRIP | Freq: Once | OPHTHALMIC | Status: AC
  Administered 2016-03-13: 1 via OPHTHALMIC
  Filled 2016-03-13: qty 1

## 2016-03-13 NOTE — ED Notes (Signed)
Pt c/o bilateral eye irritation and drainage onset in right eye last Friday, spread to left eye yesterday, worse when laying down, blurred vision, severe right eye pain, bilateral periorbital edema right worse than left. Pt seen for the same and prescribed gentamycin eye drops on Monday, no improvement.

## 2016-03-13 NOTE — ED Provider Notes (Signed)
CSN: [id number]     Arrival date & time 03/13/16  0855 History   First MD Initiated Contact with Patient 03/13/16 [phone]     Chief Complaint  Patient presents with  . Eye Problem   (Consider location/radiation/quality/duration/timing/severity/associated sxs/prior Treatment) HPI 45 y.o. female presents to the Emergency Department today complaining of right eye pain since this past weekend. Saw PCP who diagnosed with conjunctivitis. Given Gent drops. Pt has had periorbital swelling since then with eye drainage now spreading to left eye. Notes pain with EOM. Rates 8/10. Throbbing. ABX have not provided relief as per patient. No fevers. No loss of vision. No CP/SOB/ABD pain. No N/V/D. No other symptoms noted.   Past Medical History  Diagnosis Date  . Diabetes mellitus without complication ([place])   . Hypertension   . Anxiety   . Hemorrhoids    Past Surgical History  Procedure Laterality Date  . Hernia repair    . Tonsillectomy    . Adenoidectomy    . Ectopic pregnancy surgery      x2  . Tubal ligation     Family History  Problem Relation Age of Onset  . Leukemia Mother   . Cancer Other    Social History  Substance Use Topics  . Smoking status: Former Smoker    Quit date: 09/02/2013  . Smokeless tobacco: Never Used  . Alcohol Use: No   OB History    No data available     Review of Systems ROS reviewed and all are negative for acute change except as noted in the HPI.  Allergies  Lisinopril  Home Medications   Prior to Admission medications   Medication Sig Start Date End Date Taking? Authorizing Provider  acetaminophen (TYLENOL) 500 MG tablet Take 1,000 mg by mouth every 6 (six) hours as needed for moderate pain.   Yes Historical Provider, MD  ALPRAZolam (XANAX XR) 1 MG 24 hr tablet Take 1 mg by mouth at bedtime.    Yes Historical Provider, MD  docusate sodium (COLACE) 100 MG capsule Take 1 capsule (100 mg total) by mouth every 12 (twelve) hours. 03/02/16  Yes Isla Pence, MD  escitalopram (LEXAPRO) 20 MG tablet Take 20 mg by mouth at bedtime.    Yes Historical Provider, MD  hydrocortisone (ANUSOL-HC) 2.5 % rectal cream Apply rectally 2 times daily Patient taking differently: Place 1 application rectally 2 (two) times daily.  03/02/16  Yes Isla Pence, MD  ibuprofen (ADVIL,MOTRIN) 200 MG tablet Take 800 mg by mouth every 6 (six) hours as needed for moderate pain.   Yes Historical Provider, MD  IRON PO Take 1 tablet by mouth daily.   Yes Historical Provider, MD  Norethindrone-Ethinyl Estradiol-Fe Biphas (LO LOESTRIN FE) 1 MG-10 MCG / 10 MCG tablet Take 1 tablet by mouth 2 (two) times daily.     Historical Provider, MD  predniSONE (DELTASONE) 10 MG tablet Take 5 tablets (50 mg total) by mouth daily with breakfast. And decrease by one tablet daily Patient not taking: Reported on 03/02/2016 02/09/16   Orson Eva, MD  traMADol (ULTRAM) 50 MG tablet Take 50 mg by mouth every 6 (six) hours as needed for moderate pain.    Historical Provider, MD   Pulse 97  Temp(Src) 98.2 F (36.8 C) (Oral)  Resp 16  SpO2 100%   Physical Exam  Constitutional: She is oriented to person, place, and time. She appears well-developed and well-nourished.  HENT:  Head: Normocephalic and atraumatic.  Eyes: EOM are normal. Pupils  are equal, round, and reactive to light. Right eye exhibits discharge. Right conjunctiva is injected.  Pain with EOM. Pupils equal and reactive. Periorbital swelling noted on right eye.   Neck: Normal range of motion.  Cardiovascular: Normal rate and regular rhythm.   Pulmonary/Chest: Effort normal.  Abdominal: Soft.  Musculoskeletal: Normal range of motion.  Neurological: She is alert and oriented to person, place, and time.  Skin: Skin is warm and dry.  Psychiatric: She has a normal mood and affect. Her behavior is normal. Thought content normal.  Nursing note and vitals reviewed.  11:08 AM Two drops of tetracaine/proparacaine instilled into affected  eye.   Fluorescein strip applied to affected eye. Wood's lamp used to assess for corneal abrasion. No corneal abrasion identified. No foreign bodies noted. No visible hyphema.   Tonometry performed. Right eye pressure: 21 Left eye pressure: 15  Patient tolerated procedure well without immediate complication.    ED Course  Procedures (including critical care time) Labs Review Labs Reviewed  I-STAT CHEM 8, ED - Abnormal; Notable for the following:    Potassium 3.3 (*)    BUN 5 (*)    Glucose, Bld 157 (*)    Calcium, Ion 1.10 (*)    All other components within normal limits   Imaging Review Ct Orbits W/cm  03/13/2016  CLINICAL DATA:  Bilateral eye irritation. Periorbital edema. Right-sided drainage. Patient on antibiotics. EXAM: CT ORBITS WITH CONTRAST TECHNIQUE: Multidetector CT imaging of the orbits was performed following the bolus administration of intravenous contrast. CONTRAST:  17mL ISOVUE-300 IOPAMIDOL (ISOVUE-300) INJECTION 61% COMPARISON:  None. FINDINGS: Paranasal sinuses, mastoid air cells, and middle ears are normal. The visualized bones are unremarkable as well. A metallic BB overlies the soft tissues in the right supraorbital region, along the skin. No underlying soft tissue abnormalities are seen in the region of the marking BB. There is bilateral periorbital soft tissue swelling/ edema, worse on the right than the left. The soft tissue swelling is preseptal with no convincing evidence of postseptal extension. Both intra and extraconal retrobulbar fat is normal in appearance with no stranding. The visualized musculature is within normal limits. The globes are intact bilaterally. The soft tissues of the oral and nasopharynx are normal in appearance. Limited views of the brain are unremarkable. IMPRESSION: Periorbital soft tissue swelling/edema with no convincing evidence of postseptal extension. The underlying globes are intact. The retro bulbar fat demonstrates no associated  stranding. Electronically Signed   By: Dorise Bullion III M.D   On: 03/13/2016 13:27   I have personally reviewed and evaluated these images and lab results as part of my medical decision-making.   EKG Interpretation None      MDM  I have reviewed and evaluated the relevant laboratory values I have reviewed and evaluated the relevant imaging studies. I have reviewed the relevant previous healthcare records. I obtained HPI from historian. Patient discussed with supervising physician  ED Course:  Assessment: Pt is a 44yF who presents with right eye swelling since this weekend. Given Rx genta for conjunctivitis with no improvement. On exam, pt in NAD. Nontoxic/nonseptic appearing. VSS. Afebrile. Right eye with periorbital swelling. Pain with ROM with blurred vision. Concern for orbital cellulitis. CT ordered, which was unremarkable. Tonometry unremarkable. No Corneal abrasion. No orbital cellulitis. Plan is to DC home with erythromycin oitment. Plan to follow up with Optho if symptoms do not improve due to prolonged symptoms.  At time of discharge, Patient is in no acute distress. Vital Signs are  stable. Patient is able to ambulate. Patient able to tolerate PO.    Disposition/Plan:  DC Home Additional Verbal discharge instructions given and discussed with patient.  Pt Instructed to f/u with PCP in the next week for evaluation and treatment of symptoms. Return precautions given Pt acknowledges and agrees with plan  Supervising Physician Gareth Morgan, MD   Final diagnoses:  Eye pain  Conjunctivitis of right eye      Shary Decamp, PA-C 03/13/16 [place], MD 03/13/16 2145

## 2016-03-13 NOTE — Discharge Instructions (Signed)
Please read and follow all provided instructions.  Your diagnoses today include:  1. Conjunctivitis of right eye   2. Eye pain    Tests performed today include:  Vital signs. See below for your results today.   Medications prescribed:   Take as prescribed   Home care instructions:  Follow any educational materials contained in this packet.  Follow-up instructions: Please follow-up with the eye doctor provided in the next week for further evaluation of symptoms and treatment. Call and make an appointment    Return instructions:   Please return to the Emergency Department if you do not get better, if you get worse, or new symptoms OR  - Fever (temperature greater than 101.38F)  - Bleeding that does not stop with holding pressure to the area    -Severe pain (please note that you may be more sore the day after your accident)  - Chest Pain  - Difficulty breathing  - Severe nausea or vomiting  - Inability to tolerate food and liquids  - Passing out  - Skin becoming red around your wounds  - Change in mental status (confusion or lethargy)  - New numbness or weakness     Please return if you have any other emergent concerns.  Additional Information:  Your vital signs today were: BP 123/69 mmHg   Pulse 89   Temp(Src) 98.2 F (36.8 C) (Oral)   Resp 15   SpO2 99%   LMP 03/13/2016 If your blood pressure (BP) was elevated above 135/85 this visit, please have this repeated by your doctor within one month. ---------------

## 2016-03-13 NOTE — ED Notes (Signed)
PT DISCHARGED. INSTRUCTIONS AND PRESCRIPTION GIVEN. AAOX3. PT IN NO APPARENT DISTRESS OR PAIN. THE OPPORTUNITY TO ASK QUESTIONS WAS PROVIDED. 

## 2016-03-16 ENCOUNTER — Emergency Department (HOSPITAL_COMMUNITY)
Admission: EM | Admit: 2016-03-16 | Discharge: 2016-03-16 | Disposition: A | Discharge status: Home / Self Care | Payer: BLUE CROSS/BLUE SHIELD | Attending: Emergency Medicine | Admitting: Emergency Medicine

## 2016-03-16 ENCOUNTER — Encounter (HOSPITAL_COMMUNITY): Payer: Self-pay | Admitting: *Deleted

## 2016-03-16 DIAGNOSIS — Z79899 Other long term (current) drug therapy: Secondary | ICD-10-CM | POA: Diagnosis not present

## 2016-03-16 DIAGNOSIS — H109 Unspecified conjunctivitis: Secondary | ICD-10-CM | POA: Insufficient documentation

## 2016-03-16 DIAGNOSIS — Z793 Long term (current) use of hormonal contraceptives: Secondary | ICD-10-CM | POA: Diagnosis not present

## 2016-03-16 DIAGNOSIS — Z792 Long term (current) use of antibiotics: Secondary | ICD-10-CM | POA: Diagnosis not present

## 2016-03-16 DIAGNOSIS — Z79891 Long term (current) use of opiate analgesic: Secondary | ICD-10-CM | POA: Insufficient documentation

## 2016-03-16 DIAGNOSIS — Z7952 Long term (current) use of systemic steroids: Secondary | ICD-10-CM | POA: Insufficient documentation

## 2016-03-16 DIAGNOSIS — E119 Type 2 diabetes mellitus without complications: Secondary | ICD-10-CM | POA: Diagnosis not present

## 2016-03-16 DIAGNOSIS — I10 Essential (primary) hypertension: Secondary | ICD-10-CM | POA: Insufficient documentation

## 2016-03-16 DIAGNOSIS — B9689 Other specified bacterial agents as the cause of diseases classified elsewhere: Secondary | ICD-10-CM | POA: Diagnosis not present

## 2016-03-16 DIAGNOSIS — Z87891 Personal history of nicotine dependence: Secondary | ICD-10-CM | POA: Insufficient documentation

## 2016-03-16 DIAGNOSIS — H5713 Ocular pain, bilateral: Secondary | ICD-10-CM | POA: Diagnosis present

## 2016-03-16 DIAGNOSIS — Z791 Long term (current) use of non-steroidal anti-inflammatories (NSAID): Secondary | ICD-10-CM | POA: Insufficient documentation

## 2016-03-16 MED ORDER — CIPROFLOXACIN HCL 0.3 % OP SOLN
1.0000 [drp] | Freq: Once | OPHTHALMIC | Status: AC
  Administered 2016-03-16: 1 [drp] via OPHTHALMIC
  Filled 2016-03-16: qty 2.5

## 2016-03-16 MED ORDER — OXYCODONE-ACETAMINOPHEN 5-325 MG PO TABS: 1.0000 | ORAL_TABLET | ORAL | Status: DC | PRN

## 2016-03-16 NOTE — ED Notes (Signed)
Pt has 20/20 vision in both eyes. Pt states that when she blinks her right eye, it feels like there is something stuck under her bottom eyelid. Pt rates her pain at 8/10

## 2016-03-16 NOTE — ED Provider Notes (Addendum)
CSN: [id number]     Arrival date & time 03/16/16  1717 History   First MD Initiated Contact with Patient 03/16/16 1750     Chief Complaint  Patient presents with  . Eye Pain     (Consider location/radiation/quality/duration/timing/severity/associated sxs/prior Treatment) HPI Comments: Patient here complaining of 11 days of bilateral eye pain and swelling with pruritus and yellow watery drainage. Was seen here 3 days ago for similar symptoms and that visit was reviewed. She had CT of her orbits as well as ocular pressure which was not elevated. Notes slight photophobia but does not have to wear sunglasses outside. Denies any fever or chills. Mild frontal headache. Denies any loss of vision. Has been placed on ocular antibiotics and has not helped her symptoms. Called her Dr. was told to come here  Patient is a 45 y.o. female presenting with eye pain. The history is provided by the patient.  Eye Pain    Past Medical History  Diagnosis Date  . Hypertension   . Anxiety   . Hemorrhoids   . Diabetes mellitus without complication [facility])    Past Surgical History  Procedure Laterality Date  . Hernia repair    . Tonsillectomy    . Adenoidectomy    . Ectopic pregnancy surgery      x2  . Tubal ligation     Family History  Problem Relation Age of Onset  . Leukemia Mother   . Cancer Other    Social History  Substance Use Topics  . Smoking status: Former Smoker    Quit date: 09/02/2013  . Smokeless tobacco: Never Used  . Alcohol Use: No   OB History    No data available     Review of Systems  Eyes: Positive for pain.  All other systems reviewed and are negative.     Allergies  Lisinopril  Home Medications   Prior to Admission medications   Medication Sig Start Date End Date Taking? Authorizing Provider  acetaminophen (TYLENOL) 500 MG tablet Take 1,000 mg by mouth every 6 (six) hours as needed for moderate pain.    Historical Provider, MD  ALPRAZolam (XANAX XR) 1 MG 24  hr tablet Take 1 mg by mouth at bedtime.     Historical Provider, MD  amoxicillin (AMOXIL) 500 MG capsule Take 500 mg by mouth 3 (three) times daily. Started 05/01 for 10 days 03/10/16   Historical Provider, MD  docusate sodium (COLACE) 100 MG capsule Take 1 capsule (100 mg total) by mouth every 12 (twelve) hours. 03/02/16   Isla Pence, MD  erythromycin ophthalmic ointment Place a 1/2 inch ribbon of ointment into the lower eyelid. X 5 days 03/13/16   Shary Decamp, PA-C  escitalopram (LEXAPRO) 20 MG tablet Take 20 mg by mouth at bedtime.     Historical Provider, MD  gentamicin (GARAMYCIN) 0.3 % ophthalmic solution Place 2 drops into the right eye 3 (three) times daily. Started 05/01 for 10 days 03/10/16   Historical Provider, MD  hydrocortisone (ANUSOL-HC) 2.5 % rectal cream Apply rectally 2 times daily Patient taking differently: Place 1 application rectally 2 (two) times daily.  03/02/16   Isla Pence, MD  ibuprofen (ADVIL,MOTRIN) 200 MG tablet Take 800 mg by mouth every 6 (six) hours as needed for moderate pain.    Historical Provider, MD  IRON PO Take 1 tablet by mouth daily.    Historical Provider, MD  losartan (COZAAR) 100 MG tablet Take 100 mg by mouth daily.    Historical  Provider, MD  norgestrel-ethinyl estradiol (OGESTROL) 0.5-50 MG-MCG tablet Take 1 tablet by mouth daily.    Historical Provider, MD  predniSONE (DELTASONE) 10 MG tablet Take 5 tablets (50 mg total) by mouth daily with breakfast. And decrease by one tablet daily Patient not taking: Reported on 03/02/2016 02/09/16   Orson Eva, MD  traMADol (ULTRAM) 50 MG tablet Take 50 mg by mouth every 6 (six) hours as needed for moderate pain.    Historical Provider, MD   BP 131/85 mmHg  Pulse 97  Temp(Src) 98.6 F (37 C) (Oral)  Resp 18  SpO2 97%  LMP 03/13/2016 Physical Exam  Constitutional: She is oriented to person, place, and time. She appears well-developed and well-nourished.  Non-toxic appearance. No distress.  HENT:  Head:  Normocephalic and atraumatic.  Eyes: EOM are normal. Pupils are equal, round, and reactive to light. Right eye exhibits discharge and exudate. Right eye exhibits no chemosis. Left eye exhibits discharge and exudate. Left eye exhibits no chemosis. Right conjunctiva is injected. Left conjunctiva is injected. Right eye exhibits normal extraocular motion. Left eye exhibits normal extraocular motion.  Neck: Normal range of motion. Neck supple. No tracheal deviation present. No thyroid mass present.  Cardiovascular: Normal rate, regular rhythm and normal heart sounds.  Exam reveals no gallop.   No murmur heard. Pulmonary/Chest: Effort normal and breath sounds normal. No stridor. No respiratory distress. She has no decreased breath sounds. She has no wheezes. She has no rhonchi. She has no rales.  Abdominal: Soft. Normal appearance and bowel sounds are normal. She exhibits no distension. There is no tenderness. There is no rebound and no CVA tenderness.  Musculoskeletal: Normal range of motion. She exhibits no edema or tenderness.  Neurological: She is alert and oriented to person, place, and time. She has normal strength. No cranial nerve deficit or sensory deficit. GCS eye subscore is 4. GCS verbal subscore is 5. GCS motor subscore is 6.  Skin: Skin is warm and dry. No abrasion and no rash noted.  Psychiatric: She has a normal mood and affect. Her speech is normal and behavior is normal.  Nursing note and vitals reviewed.   ED Course  Procedures (including critical care time) Labs Review Labs Reviewed - No data to display  Imaging Review No results found. I have personally reviewed and evaluated these images and lab results as part of my medical decision-making.   EKG Interpretation None      MDM   Final diagnoses:  None  Discussed with Dr. Posey Pronto from ophthalmology he will see the patient tomorrow for follow-up. Patient given 1 dose of ocular ciprofloxacin here. Return precautions  given  Patient's visual acuity is 20/20  Lacretia Leigh, MD 03/16/16 1904  Lacretia Leigh, MD 03/16/16 [phone]

## 2016-03-16 NOTE — ED Notes (Addendum)
Pt complains of bilateral eye pain. Pt states she had burning and itching in her right eye, which spread to her left eye. Pt was seen at her PCP last Monday, given eye drops, then came to ED on Thursday. Pt had CT performed and had pressure in eye tested. Pt states the pain is making her anxious and nauseous . Pt was told to go to hospital to see on call ophthalmologist.

## 2016-03-16 NOTE — Discharge Instructions (Signed)
Use 1 drop of the eye antibiotic every 6 hours. Called the ophthalmologist, Dr. Posey Pronto, at 9:00 in the morning so that he can see you. Return here for any vision loss or fever Bacterial Conjunctivitis Bacterial conjunctivitis, commonly called pink eye, is an inflammation of the clear membrane that covers the white part of the eye (conjunctiva). The inflammation can also happen on the underside of the eyelids. The blood vessels in the conjunctiva become inflamed, causing the eye to become red or pink. Bacterial conjunctivitis may spread easily from one eye to another and from person to person (contagious).  CAUSES  Bacterial conjunctivitis is caused by bacteria. The bacteria may come from your own skin, your upper respiratory tract, or from someone else with bacterial conjunctivitis. SYMPTOMS  The normally white color of the eye or the underside of the eyelid is usually pink or red. The pink eye is usually associated with irritation, tearing, and some sensitivity to light. Bacterial conjunctivitis is often associated with a thick, yellowish discharge from the eye. The discharge may turn into a crust on the eyelids overnight, which causes your eyelids to stick together. If a discharge is present, there may also be some blurred vision in the affected eye. DIAGNOSIS  Bacterial conjunctivitis is diagnosed by your caregiver through an eye exam and the symptoms that you report. Your caregiver looks for changes in the surface tissues of your eyes, which may point to the specific type of conjunctivitis. A sample of any discharge may be collected on a cotton-tip swab if you have a severe case of conjunctivitis, if your cornea is affected, or if you keep getting repeat infections that do not respond to treatment. The sample will be sent to a lab to see if the inflammation is caused by a bacterial infection and to see if the infection will respond to antibiotic medicines. TREATMENT   Bacterial conjunctivitis is  treated with antibiotics. Antibiotic eyedrops are most often used. However, antibiotic ointments are also available. Antibiotics pills are sometimes used. Artificial tears or eye washes may ease discomfort. HOME CARE INSTRUCTIONS   To ease discomfort, apply a cool, clean washcloth to your eye for 10-20 minutes, 3-4 times a day.  Gently wipe away any drainage from your eye with a warm, wet washcloth or a cotton ball.  Wash your hands often with soap and water. Use paper towels to dry your hands.  Do not share towels or washcloths. This may spread the infection.  Change or wash your pillowcase every day.  You should not use eye makeup until the infection is gone.  Do not operate machinery or drive if your vision is blurred.  Stop using contact lenses. Ask your caregiver how to sterilize or replace your contacts before using them again. This depends on the type of contact lenses that you use.  When applying medicine to the infected eye, do not touch the edge of your eyelid with the eyedrop bottle or ointment tube. SEEK IMMEDIATE MEDICAL CARE IF:   Your infection has not improved within 3 days after beginning treatment.  You had yellow discharge from your eye and it returns.  You have increased eye pain.  Your eye redness is spreading.  Your vision becomes blurred.  You have a fever or persistent symptoms for more than 2-3 days.  You have a fever and your symptoms suddenly get worse.  You have facial pain, redness, or swelling. MAKE SURE YOU:   Understand these instructions.  Will watch your condition.  Will  get help right away if you are not doing well or get worse.   This information is not intended to replace advice given to you by your health care provider. Make sure you discuss any questions you have with your health care provider.   Document Released: 10/27/2005 Document Revised: 11/17/2014 Document Reviewed: 03/29/2012 Elsevier Interactive Patient Education [employer]

## 2016-04-07 ENCOUNTER — Emergency Department (HOSPITAL_COMMUNITY)
Admission: EM | Admit: 2016-04-07 | Discharge: 2016-04-07 | Disposition: A | Discharge status: Home / Self Care | Payer: BLUE CROSS/BLUE SHIELD | Attending: Emergency Medicine | Admitting: Emergency Medicine

## 2016-04-07 ENCOUNTER — Encounter (HOSPITAL_COMMUNITY): Payer: Self-pay | Admitting: *Deleted

## 2016-04-07 DIAGNOSIS — Z87891 Personal history of nicotine dependence: Secondary | ICD-10-CM | POA: Diagnosis not present

## 2016-04-07 DIAGNOSIS — Z7982 Long term (current) use of aspirin: Secondary | ICD-10-CM | POA: Diagnosis not present

## 2016-04-07 DIAGNOSIS — I10 Essential (primary) hypertension: Secondary | ICD-10-CM | POA: Diagnosis not present

## 2016-04-07 DIAGNOSIS — E119 Type 2 diabetes mellitus without complications: Secondary | ICD-10-CM | POA: Insufficient documentation

## 2016-04-07 DIAGNOSIS — Z79899 Other long term (current) drug therapy: Secondary | ICD-10-CM | POA: Diagnosis not present

## 2016-04-07 DIAGNOSIS — N939 Abnormal uterine and vaginal bleeding, unspecified: Secondary | ICD-10-CM | POA: Diagnosis not present

## 2016-04-07 LAB — BASIC METABOLIC PANEL
ANION GAP: 8 (ref 5–15)
BUN: 8 mg/dL (ref 6–20)
CALCIUM: 8.5 mg/dL — AB (ref 8.9–10.3)
CO2: 20 mmol/L — AB (ref 22–32)
CREATININE: 0.86 mg/dL (ref 0.44–1.00)
Chloride: 109 mmol/L (ref 101–111)
GLUCOSE: 144 mg/dL — AB (ref 65–99)
Potassium: 3.2 mmol/L — ABNORMAL LOW (ref 3.5–5.1)
Sodium: 137 mmol/L (ref 135–145)

## 2016-04-07 LAB — CBC WITH DIFFERENTIAL/PLATELET
BASOS ABS: 0 10*3/uL (ref 0.0–0.1)
BASOS PCT: 0 %
EOS PCT: 2 %
Eosinophils Absolute: 0.2 10*3/uL (ref 0.0–0.7)
HEMATOCRIT: 33.2 % — AB (ref 36.0–46.0)
Hemoglobin: 11 g/dL — ABNORMAL LOW (ref 12.0–15.0)
Lymphocytes Relative: 30 %
Lymphs Abs: 3.9 10*3/uL (ref 0.7–4.0)
MCH: 28.8 pg (ref 26.0–34.0)
MCHC: 33.1 g/dL (ref 30.0–36.0)
MCV: 86.9 fL (ref 78.0–100.0)
MONO ABS: 0.7 10*3/uL (ref 0.1–1.0)
MONOS PCT: 6 %
Neutro Abs: 8.1 10*3/uL — ABNORMAL HIGH (ref 1.7–7.7)
Neutrophils Relative %: 62 %
PLATELETS: 232 10*3/uL (ref 150–400)
RBC: 3.82 MIL/uL — ABNORMAL LOW (ref 3.87–5.11)
RDW: 14.5 % (ref 11.5–15.5)
WBC: 13.1 10*3/uL — ABNORMAL HIGH (ref 4.0–10.5)

## 2016-04-07 LAB — WET PREP, GENITAL
CLUE CELLS WET PREP: NONE SEEN
Sperm: NONE SEEN
Trich, Wet Prep: NONE SEEN
Yeast Wet Prep HPF POC: NONE SEEN

## 2016-04-07 LAB — I-STAT BETA HCG BLOOD, ED (MC, WL, AP ONLY)

## 2016-04-07 MED ORDER — POTASSIUM CHLORIDE CRYS ER 20 MEQ PO TBCR
40.0000 meq | EXTENDED_RELEASE_TABLET | Freq: Once | ORAL | Status: AC
  Administered 2016-04-07: 40 meq via ORAL
  Filled 2016-04-07: qty 2

## 2016-04-07 MED ORDER — HYDROCODONE-ACETAMINOPHEN 5-325 MG PO TABS
1.0000 | ORAL_TABLET | Freq: Once | ORAL | Status: AC
  Administered 2016-04-07: 1 via ORAL
  Filled 2016-04-07: qty 1

## 2016-04-07 MED ORDER — HYDROCODONE-ACETAMINOPHEN 5-325 MG PO TABS: 1.0000 | ORAL_TABLET | ORAL | Status: DC | PRN

## 2016-04-07 MED ORDER — SODIUM CHLORIDE 0.9 % IV BOLUS (SEPSIS)
1000.0000 mL | Freq: Once | INTRAVENOUS | Status: AC
  Administered 2016-04-07: 1000 mL via INTRAVENOUS

## 2016-04-07 NOTE — ED Notes (Signed)
Pt says that she has had vaginal bleeding since January. Pt says he (dr. Velda Shell) started her on birth control and a week ago started on estriadol. Pt reports increased bleeding, cramping, and clotting.

## 2016-04-07 NOTE — Discharge Instructions (Signed)
Continue taking your home medications of Estradiol and iron supplements as prescribed.  Call Dr. Sherran Needs office tomorrow to schedule a follow up appointment.  Please return to the Emergency Department if symptoms worsen or new onset of fever, abdominal pain, vomiting, diarrhea, worsening bleeding, lightheadedness, syncope.

## 2016-04-07 NOTE — ED Provider Notes (Signed)
CSN: [id number]     Arrival date & time 04/07/16  2009 History   First MD Initiated Contact with Patient 04/07/16 2028     Chief Complaint  Patient presents with  . Vaginal Bleeding     (Consider location/radiation/quality/duration/timing/severity/associated sxs/prior Treatment) HPI   Patient is a 45 year old female past medical history of hypertension and diabetes who presents the ED with complaint of vaginal bleeding. Patient reports she has had continual vaginal bleeding since January. She notes she was seen by her gynecologist, Dr. Radene Knee, in March for her continued bleeding, dx with uterine fibroids via [place] and unchanged left ovarian cyst, and was initially started on oral birth control. She notes she does not have any changes in bleeding initially and was switched to a higher dose of birth control. Pt reports that they are planning on performing a D&C however due to financial reasons she is waiting to have the procedure until July. Pt reports her bleeding began to worsen last week resulting in Dr. Radene Knee starting her on estradiol and iron supplements. Pt reports her bleeding significantly worsened 4 days day. She reports she is going through a super tampon and pad appx. Every 30 minutes and endorses passing golf ball sized clots. Endorses associated lower abdominal cramping (which she notes has been present since January), fatigue and intermittent lightheadedness. Denies fever, chills, SOB, CP, N/V/D, urinary sxs, numbness, tingling, weakness, syncope. Pt notes she has been taking excedrin and ibuprofen at home from abdominal cramps with mild intermittent relief.   Past Medical History  Diagnosis Date  . Hypertension   . Anxiety   . Hemorrhoids   . Diabetes mellitus without complication [facility])    Past Surgical History  Procedure Laterality Date  . Hernia repair    . Tonsillectomy    . Adenoidectomy    . Ectopic pregnancy surgery      x2  . Tubal ligation     Family History  Problem  Relation Age of Onset  . Leukemia Mother   . Cancer Other    Social History  Substance Use Topics  . Smoking status: Former Smoker    Quit date: 09/02/2013  . Smokeless tobacco: Never Used  . Alcohol Use: No   OB History    No data available     Review of Systems  Constitutional: Positive for fatigue.  Gastrointestinal: Positive for abdominal pain.  Genitourinary: Positive for vaginal bleeding.  Neurological: Positive for light-headedness.  All other systems reviewed and are negative.     Allergies  Lisinopril  Home Medications   Prior to Admission medications   Medication Sig Start Date End Date Taking? Authorizing Provider  acetaminophen (TYLENOL) 500 MG tablet Take 1,000 mg by mouth every 6 (six) hours as needed for moderate pain.   Yes Historical Provider, MD  ALPRAZolam (XANAX XR) 1 MG 24 hr tablet Take 1 mg by mouth at bedtime.    Yes Historical Provider, MD  aspirin-acetaminophen-caffeine (EXCEDRIN MIGRAINE) [phone] MG tablet Take 2 tablets by mouth every 6 (six) hours as needed for headache.   Yes Historical Provider, MD  docusate sodium (COLACE) 100 MG capsule Take 1 capsule (100 mg total) by mouth every 12 (twelve) hours. 03/02/16  Yes Isla Pence, MD  escitalopram (LEXAPRO) 20 MG tablet Take 20 mg by mouth at bedtime.    Yes Historical Provider, MD  estradiol (ESTRACE) 1 MG tablet Take 1 mg by mouth daily. x7 days   Yes Historical Provider, MD  ibuprofen (ADVIL,MOTRIN) 200 MG tablet  Take 800 mg by mouth every 6 (six) hours as needed for moderate pain.   Yes Historical Provider, MD  IRON PO Take 1 tablet by mouth daily.   Yes Historical Provider, MD  losartan (COZAAR) 100 MG tablet Take 100 mg by mouth daily.   Yes Historical Provider, MD  norethindrone-ethinyl estradiol (JUNEL FE,GILDESS FE,LOESTRIN FE) 1-20 MG-MCG tablet Take 1 tablet by mouth daily.   Yes Historical Provider, MD  traMADol (ULTRAM) 50 MG tablet Take 50 mg by mouth every 6 (six) hours as  needed for moderate pain.   Yes Historical Provider, MD  erythromycin ophthalmic ointment Place a 1/2 inch ribbon of ointment into the lower eyelid. X 5 days Patient not taking: Reported on 04/07/2016 03/13/16   Shary Decamp, PA-C  HYDROcodone-acetaminophen (NORCO/VICODIN) 5-325 MG tablet Take 1-2 tablets by mouth every 4 (four) hours as needed. 04/07/16   Nona Dell, PA-C  hydrocortisone (ANUSOL-HC) 2.5 % rectal cream Apply rectally 2 times daily Patient not taking: Reported on 04/07/2016 03/02/16   Isla Pence, MD  oxyCODONE-acetaminophen (PERCOCET/ROXICET) 5-325 MG tablet Take 1-2 tablets by mouth every 4 (four) hours as needed for severe pain. Patient not taking: Reported on 04/07/2016 03/16/16   Lacretia Leigh, MD  predniSONE (DELTASONE) 10 MG tablet Take 5 tablets (50 mg total) by mouth daily with breakfast. And decrease by one tablet daily Patient not taking: Reported on 03/02/2016 02/09/16   Orson Eva, MD   BP 122/60 mmHg  Pulse 85  Temp(Src) 99 F (37.2 C) (Oral)  Resp 15  Ht 5\' 7"  (1.702 m)  Wt 84.823 kg  BMI 29.28 kg/m2  SpO2 99%  LMP 03/13/2016 Physical Exam  Constitutional: She is oriented to person, place, and time. She appears well-developed and well-nourished. No distress.  HENT:  Head: Normocephalic and atraumatic.  Mouth/Throat: Oropharynx is clear and moist. No oropharyngeal exudate.  Eyes: Conjunctivae and EOM are normal. Pupils are equal, round, and reactive to light. Right eye exhibits no discharge. Left eye exhibits no discharge. No scleral icterus.  Neck: Normal range of motion. Neck supple.  Cardiovascular: Normal rate, regular rhythm, normal heart sounds and intact distal pulses.   Pulmonary/Chest: Effort normal and breath sounds normal. No respiratory distress. She has no wheezes. She has no rales. She exhibits no tenderness.  Abdominal: Soft. Bowel sounds are normal. She exhibits no distension and no mass. There is tenderness (lower abdominal quadrants TTP).  There is no rebound and no guarding.  Musculoskeletal: Normal range of motion. She exhibits no edema.  Neurological: She is alert and oriented to person, place, and time.  Skin: Skin is warm and dry. She is not diaphoretic.  Nursing note and vitals reviewed.  Pelvic exam: normal external genitalia, vulva, vagina, cervix, uterus and adnexa, VULVA: normal appearing vulva with no masses, tenderness or lesions, VAGINA: normal appearing vagina with normal color, small about of blood and one small blood clot noted in vaginal vault, no lesions, WET MOUNT done - results: white blood cells, DNA probe for chlamydia and GC obtained, CERVIX: normal appearing cervix without discharge or lesions, UTERUS: uterus is normal size, shape, consistency and nontender, tenderness, ADNEXA: normal adnexa in size and no masses, tenderness bilateral, exam chaperoned by female nurse.   ED Course  Procedures (including critical care time) Labs Review Labs Reviewed  WET PREP, GENITAL - Abnormal; Notable for the following:    WBC, Wet Prep HPF POC FEW (*)    All other components within normal limits  CBC WITH  DIFFERENTIAL/PLATELET - Abnormal; Notable for the following:    WBC 13.1 (*)    RBC 3.82 (*)    Hemoglobin 11.0 (*)    HCT 33.2 (*)    Neutro Abs 8.1 (*)    All other components within normal limits  BASIC METABOLIC PANEL - Abnormal; Notable for the following:    Potassium 3.2 (*)    CO2 20 (*)    Glucose, Bld 144 (*)    Calcium 8.5 (*)    All other components within normal limits  I-STAT BETA HCG BLOOD, ED ([facility], [facility], [facility] ONLY)  GC/CHLAMYDIA PROBE AMP ([place]) NOT AT [facility]    Imaging Review No results found. I have personally reviewed and evaluated these images and lab results as part of my medical decision-making.   EKG Interpretation None      MDM   Final diagnoses:  Vaginal bleeding    Patient presents with vaginal bleeding that has been present since January with associated abdominal  cramping. She notes she has been seen by OB/GYN and started on birth control, estradiol and iron supplements. She reports worsening of bleeding over the past 2 days. Denies fever. VSS. Exam revealed mild tenderness and lower abdominal quadrants, no peritoneal sign. Pelvic exam revealed small amount of blood in vaginal vault, small single blood clot hasn't, no CMT, mild tenderness over bilateral adnexa and uterus. Patient reports history of uterine fibroid and left ovarian cyst which was diagnosed by her OB/GYN within ultrasound performed last month. Patient given IV fluids and pain meds. WBC 13.1. Potassium 3.2, patient given oral potassium in the ED. Hemoglobin 11. Patient appears to be hemodynamically stable to be d/c home with OBGYN follow up. Discussed results and plan for discharge with patient. Advised patient to continue taking her prescriptions of birth control, estradiol and iron supplements as prescribed. Advised patient to call her OB/GYN tomorrow morning to schedule a follow-up appointment for further management of her vaginal bleeding. Discussed return precautions with patient.    Chesley Noon Smarr, [place] 04/08/16 0101  Sharlett Iles, MD 04/09/16 [phone]

## 2016-04-08 LAB — GC/CHLAMYDIA PROBE AMP (~~LOC~~) NOT AT ARMC
Chlamydia: NEGATIVE
NEISSERIA GONORRHEA: NEGATIVE

## 2016-04-14 ENCOUNTER — Ambulatory Visit (INDEPENDENT_AMBULATORY_CARE_PROVIDER_SITE_OTHER): Payer: BLUE CROSS/BLUE SHIELD | Admitting: Obstetrics & Gynecology

## 2016-04-14 ENCOUNTER — Other Ambulatory Visit (HOSPITAL_COMMUNITY)
Admission: RE | Admit: 2016-04-14 | Payer: BLUE CROSS/BLUE SHIELD | Source: Ambulatory Visit | Admitting: Obstetrics & Gynecology

## 2016-04-14 ENCOUNTER — Encounter: Payer: Self-pay | Admitting: Obstetrics & Gynecology

## 2016-04-14 ENCOUNTER — Other Ambulatory Visit (HOSPITAL_COMMUNITY)
Admission: RE | Admit: 2016-04-14 | Discharge: 2016-04-14 | Disposition: A | Discharge status: Home / Self Care | Payer: BLUE CROSS/BLUE SHIELD | Source: Ambulatory Visit | Attending: Obstetrics & Gynecology | Admitting: Obstetrics & Gynecology

## 2016-04-14 VITALS — BP 147/79 | HR 105 | Ht 67.0 in | Wt 272.0 lb

## 2016-04-14 DIAGNOSIS — N951 Menopausal and female climacteric states: Secondary | ICD-10-CM | POA: Diagnosis not present

## 2016-04-14 DIAGNOSIS — N939 Abnormal uterine and vaginal bleeding, unspecified: Secondary | ICD-10-CM | POA: Insufficient documentation

## 2016-04-14 DIAGNOSIS — Z1151 Encounter for screening for human papillomavirus (HPV): Secondary | ICD-10-CM | POA: Diagnosis not present

## 2016-04-14 DIAGNOSIS — Z124 Encounter for screening for malignant neoplasm of cervix: Secondary | ICD-10-CM | POA: Diagnosis not present

## 2016-04-14 DIAGNOSIS — R030 Elevated blood-pressure reading, without diagnosis of hypertension: Secondary | ICD-10-CM

## 2016-04-14 DIAGNOSIS — IMO0001 Reserved for inherently not codable concepts without codable children: Secondary | ICD-10-CM

## 2016-04-14 LAB — CBC
HCT: 34.4 % — ABNORMAL LOW (ref 35.0–45.0)
Hemoglobin: 11 g/dL — ABNORMAL LOW (ref 11.7–15.5)
MCH: 28.7 pg (ref 27.0–33.0)
MCHC: 32 g/dL (ref 32.0–36.0)
MCV: 89.8 fL (ref 80.0–100.0)
MPV: 12.3 fL (ref 7.5–12.5)
PLATELETS: 299 10*3/uL (ref 140–400)
RBC: 3.83 MIL/uL (ref 3.80–5.10)
RDW: 15.6 % — ABNORMAL HIGH (ref 11.0–15.0)
WBC: 9.9 10*3/uL (ref 3.8–10.8)

## 2016-04-14 MED ORDER — MEGESTROL ACETATE 20 MG PO TABS: 40.0000 mg | ORAL_TABLET | Freq: Three times a day (TID) | ORAL | Status: DC

## 2016-04-14 NOTE — Progress Notes (Signed)
Patient ID: Vanessa Barajas, female   DOB: April 24, 1971, 45 y.o.   MRN: [id number] History:  45 y.o. G3P102.  Pt s/u ECP x2 s/p BTL.  Pt s/p laparotomy x 2 for ECP. S/p hernia repair x2. Here today for AUB.  Pt reports tjat she has had bleeding issues.  She reports that within the last 2 years she started in 2016 with no cycles for four months in 2016. Then in after that pt reports bleeding for 1 month straight then 3 months with no cycle.  In Jan and Feb 2017 pt had bleeding for 2 months. Pt saw Dr. Radene Knee at [facility].  He placed pt on OCPs and did an [place].  Pt had a hysterosono. Pt did not have a bx.  She had a fibroid and an ovarian cyst.  She cont'd on the OCPs.  Pt reports that the bleeding was very heavy.  Sx did not change.  This was in Mar.  Pt was offered a D&C then pt was told that she had to pay 20% and pt did not have the funds and she cont'd bleeding.  Pt wants to be seen for a hysterectomy.  She has not had a biopsy. She is still bleeding. She is tired and anemic and has had no relief form the bleeding. Using 2 pads at a time.  Too heavy for a tampon.  Pt reports daily bleeding.   Has never had an endobx.       The following portions of the patient's history were reviewed and updated as appropriate: allergies, current medications, past family history, past medical history, past social history, past surgical history and problem list.  No tob x 2 years.  Past Surgical History  Procedure Laterality Date  . Hernia repair    . Tonsillectomy    . Adenoidectomy    . Ectopic pregnancy surgery      x2  . Tubal ligation     Current Outpatient Prescriptions on File Prior to Visit  Medication Sig Dispense Refill  . acetaminophen (TYLENOL) 500 MG tablet Take 1,000 mg by mouth every 6 (six) hours as needed for moderate pain.    Marland Kitchen ALPRAZolam (XANAX XR) 1 MG 24 hr tablet Take 1 mg by mouth at bedtime.     Marland Kitchen aspirin-acetaminophen-caffeine (EXCEDRIN MIGRAINE) 250-250-65 MG tablet Take 2 tablets by  mouth every 6 (six) hours as needed for headache.    . docusate sodium (COLACE) 100 MG capsule Take 1 capsule (100 mg total) by mouth every 12 (twelve) hours. 60 capsule 0  . erythromycin ophthalmic ointment Place a 1/2 inch ribbon of ointment into the lower eyelid. X 5 days 1 g 0  . escitalopram (LEXAPRO) 20 MG tablet Take 20 mg by mouth at bedtime.     Marland Kitchen ibuprofen (ADVIL,MOTRIN) 200 MG tablet Take 800 mg by mouth every 6 (six) hours as needed for moderate pain.    . IRON PO Take 1 tablet by mouth daily.    Marland Kitchen losartan (COZAAR) 100 MG tablet Take 100 mg by mouth daily.    . traMADol (ULTRAM) 50 MG tablet Take 50 mg by mouth every 6 (six) hours as needed for moderate pain.    Marland Kitchen HYDROcodone-acetaminophen (NORCO/VICODIN) 5-325 MG tablet Take 1-2 tablets by mouth every 4 (four) hours as needed. (Patient not taking: Reported on 04/14/2016) 10 tablet 0  . hydrocortisone (ANUSOL-HC) 2.5 % rectal cream Apply rectally 2 times daily (Patient not taking: Reported on 04/07/2016) 28.35 g 0  .  norethindrone-ethinyl estradiol (JUNEL FE,GILDESS FE,LOESTRIN FE) 1-20 MG-MCG tablet Take 1 tablet by mouth daily. Reported on 04/14/2016    . oxyCODONE-acetaminophen (PERCOCET/ROXICET) 5-325 MG tablet Take 1-2 tablets by mouth every 4 (four) hours as needed for severe pain. (Patient not taking: Reported on 04/07/2016) 10 tablet 0  . predniSONE (DELTASONE) 10 MG tablet Take 5 tablets (50 mg total) by mouth daily with breakfast. And decrease by one tablet daily (Patient not taking: Reported on 03/02/2016) 15 tablet 0   No current facility-administered medications on file prior to visit.      Review of Systems:  Pertinent items are noted in HPI.  Objective:  Physical Exam Blood pressure 147/79, pulse 105, height 5\' 7"  (1.702 m). Gen: NAD Abd: Soft, nontender and nondistended transverse incision.  There is an infraumbilical incision that is well healed  Pelvic: Normal appearing external genitalia; normal appearing vaginal  mucosa and cervix.  Normal discharge.  Enlarged uterus with irregluar contour. No other palpable masses, no uterine or adnexal tenderness  The indications for endometrial biopsy were reviewed.   Risks of the biopsy including cramping, bleeding, infection, uterine perforation, inadequate specimen and need for additional procedures  were discussed. The patient states she understands and agrees to undergo procedure today. Consent was signed. Time out was performed. Urine HCG was negative. A sterile speculum was placed in the patient's vagina and the cervix was prepped with Betadine. A single-toothed tenaculum was placed on the anterior lip of the cervix to stabilize it. The 3 mm pipelle was introduced into the endometrial cavity without difficulty to a depth of 11cm, and a moderate amount of tissue was obtained and sent to pathology. The instruments were removed from the patient's vagina. Minimal bleeding from the cervix was noted. The patient tolerated the procedure well.    Labs and Imaging CBC    Component Value Date/Time   WBC 13.1* 04/07/2016 2103   RBC 3.82* 04/07/2016 2103   HGB 11.0* 04/07/2016 2103   HCT 33.2* 04/07/2016 2103   PLT 232 04/07/2016 2103   MCV 86.9 04/07/2016 2103   MCH 28.8 04/07/2016 2103   MCHC 33.1 04/07/2016 2103   RDW 14.5 04/07/2016 2103   LYMPHSABS 3.9 04/07/2016 2103   MONOABS 0.7 04/07/2016 2103   EOSABS 0.2 04/07/2016 2103   BASOSABS 0.0 04/07/2016 2103       Assessment & Plan:  AUB- suspect related to perimenopause given periods of amenorrhea. Suspect uterine fibroids   Discontinue OCP's Megace 40 mg tid for 2 weeks then bid  Labs: CBC, TSH  Routine post-procedure instructions were given to the patient.  F/u Endo bx F/u in 2 weeks to review path then in 3 months or sooner prn Records from Dr. Ophelia Charter [facility] (need sono results)  Note: pt reports that she was told to take antihypertensive meds because the OCPs sometimes increase the BP. She reports  that she was never dx'd with elevated BP. She wants to know if she should stop with BP meds with cessation of OCPs.  REviewed BP.  Rec continue meds for now.

## 2016-04-14 NOTE — Patient Instructions (Addendum)
Dysfunctional Uterine Bleeding Dysfunctional uterine bleeding is abnormal bleeding from the uterus. Dysfunctional uterine bleeding includes:  A period that comes earlier or later than usual.  A period that is lighter, heavier, or has blood clots.  Bleeding between periods.  Skipping one or more periods.  Bleeding after sexual intercourse.  Bleeding after menopause. HOME CARE INSTRUCTIONS  Pay attention to any changes in your symptoms. Follow these instructions to help with your condition: Eating  Eat well-balanced meals. Include foods that are high in iron, such as liver, meat, shellfish, green leafy vegetables, and eggs.  If you become constipated:  Drink plenty of water.  Eat fruits and vegetables that are high in water and fiber, such as spinach, carrots, raspberries, apples, and mango. Medicines  Take over-the-counter and prescription medicines only as told by your health care provider.  Do not change medicines without talking with your health care provider.  Aspirin or medicines that contain aspirin may make the bleeding worse. Do not take those medicines:  During the week before your period.  During your period.  If you were prescribed iron pills, take them as told by your health care provider. Iron pills help to replace iron that your body loses because of this condition. Activity  If you need to change your sanitary pad or tampon more than one time every 2 hours:  Lie in bed with your feet raised (elevated).  Place a cold pack on your lower abdomen.  Rest as much as possible until the bleeding stops or slows down.  Do not try to lose weight until the bleeding has stopped and your blood iron level is back to normal. Other Instructions  For two months, write down:  When your period starts.  When your period ends.  When any abnormal bleeding occurs.  What problems you notice.  Keep all follow up visits as told by your health care provider. This is  important. SEEK MEDICAL CARE IF:  You get light-headed or weak.  You have nausea and vomiting.  You cannot eat or drink without vomiting.  You feel dizzy or have diarrhea while you are taking medicines.  You are taking birth control pills or hormones, and you want to change them or stop taking them. SEEK IMMEDIATE MEDICAL CARE IF:  You develop a fever or chills.  You need to change your sanitary pad or tampon more than one time per hour.  Your bleeding becomes heavier, or your flow contains clots more often.  You develop pain in your abdomen.  You lose consciousness.  You develop a rash.   This information is not intended to replace advice given to you by your health care provider. Make sure you discuss any questions you have with your health care provider.   Document Released: 10/24/2000 Document Revised: 07/18/2015 Document Reviewed: 01/22/2015 Elsevier Interactive Patient Education 2016 [place] is the time when your body begins to move into the menopause (no menstrual period for 12 straight months). It is a natural process. Perimenopause can begin 2-8 years before the menopause and usually lasts for 1 year after the menopause. During this time, your ovaries may or may not produce an egg. The ovaries vary in their production of estrogen and progesterone hormones each month. This can cause irregular menstrual periods, difficulty getting pregnant, vaginal bleeding between periods, and uncomfortable symptoms. CAUSES  Irregular production of the ovarian hormones, estrogen and progesterone, and not ovulating every month.  Other causes include:  Tumor of the pituitary gland  in the brain.  Medical disease that affects the ovaries.  Radiation treatment.  Chemotherapy.  Unknown causes.  Heavy smoking and excessive alcohol intake can bring on perimenopause sooner. SIGNS AND SYMPTOMS   Hot flashes.  Night sweats.  Irregular menstrual  periods.  Decreased sex drive.  Vaginal dryness.  Headaches.  Mood swings.  Depression.  Memory problems.  Irritability.  Tiredness.  Weight gain.  Trouble getting pregnant.  The beginning of losing bone cells (osteoporosis).  The beginning of hardening of the arteries (atherosclerosis). DIAGNOSIS  Your health care provider will make a diagnosis by analyzing your age, menstrual history, and symptoms. He or she will do a physical exam and note any changes in your body, especially your female organs. Female hormone tests may or may not be helpful depending on the amount of female hormones you produce and when you produce them. However, other hormone tests may be helpful to rule out other problems. TREATMENT  In some cases, no treatment is needed. The decision on whether treatment is necessary during the perimenopause should be made by you and your health care provider based on how the symptoms are affecting you and your lifestyle. Various treatments are available, such as:  Treating individual symptoms with a specific medicine for that symptom.  Herbal medicines that can help specific symptoms.  Counseling.  Group therapy. HOME CARE INSTRUCTIONS   Keep track of your menstrual periods (when they occur, how heavy they are, how long between periods, and how long they last) as well as your symptoms and when they started.  Only take over-the-counter or prescription medicines as directed by your health care provider.  Sleep and rest.  Exercise.  Eat a diet that contains calcium (good for your bones) and soy (acts like the estrogen hormone).  Do not smoke.  Avoid alcoholic beverages.  Take vitamin supplements as recommended by your health care provider. Taking vitamin E may help in certain cases.  Take calcium and vitamin D supplements to help prevent bone loss.  Group therapy is sometimes helpful.  Acupuncture may help in some cases. SEEK MEDICAL CARE IF:   You  have questions about any symptoms you are having.  You need a referral to a specialist (gynecologist, psychiatrist, or psychologist). SEEK IMMEDIATE MEDICAL CARE IF:   You have vaginal bleeding.  Your period lasts longer than 8 days.  Your periods are recurring sooner than 21 days.  You have bleeding after intercourse.  You have severe depression.  You have pain when you urinate.  You have severe headaches.  You have vision problems.   This information is not intended to replace advice given to you by your health care provider. Make sure you discuss any questions you have with your health care provider.   Document Released: 12/04/2004 Document Revised: 11/17/2014 Document Reviewed: 05/26/2013 Elsevier Interactive Patient Education [employer].

## 2016-04-14 NOTE — Addendum Note (Signed)
Addended by: Phill Myron on: 04/14/2016 09:48 AM   Modules accepted: Orders

## 2016-04-15 LAB — TSH: TSH: 2.16 m[IU]/L

## 2016-04-16 ENCOUNTER — Telehealth: Payer: Self-pay

## 2016-04-16 NOTE — Telephone Encounter (Signed)
-----   Message from Lavonia Drafts, MD sent at 04/16/2016  1:05 PM EDT ----- Please call pt. Her endo bx and PAP were both negative. She should continue the Megace and f/u as scheduled.  Thx, clh-S

## 2016-04-16 NOTE — Telephone Encounter (Signed)
Patient called and made aware that that her endometrial biopsy was normal and her pap was normal.   Patient states that she has not had any bleeding since the EMB that was performed in the office. Patient states she would like the doctor to know she will not be taking any of the Megace, unless she begins to bleed again and then she will start it. Made patient aware I will pass along message. Patient plans to follow up with Dr. Ihor Dow. Vanessa Alu RN BSN

## 2016-04-17 LAB — CYTOLOGY - PAP

## 2016-05-01 ENCOUNTER — Ambulatory Visit: Payer: BLUE CROSS/BLUE SHIELD | Admitting: Obstetrics & Gynecology

## 2016-09-24 ENCOUNTER — Ambulatory Visit: Payer: BLUE CROSS/BLUE SHIELD | Admitting: Obstetrics & Gynecology

## 2016-10-23 ENCOUNTER — Ambulatory Visit: Payer: BLUE CROSS/BLUE SHIELD | Admitting: Obstetrics & Gynecology

## 2017-08-14 ENCOUNTER — Ambulatory Visit: Payer: Self-pay | Admitting: Podiatry

## 2017-10-14 ENCOUNTER — Emergency Department (HOSPITAL_COMMUNITY)
Admission: EM | Admit: 2017-10-14 | Discharge: 2017-10-14 | Disposition: A | Discharge status: Home / Self Care | Payer: Commercial Managed Care - PPO | Attending: Emergency Medicine | Admitting: Emergency Medicine

## 2017-10-14 ENCOUNTER — Encounter (HOSPITAL_COMMUNITY): Payer: Self-pay | Admitting: Emergency Medicine

## 2017-10-14 ENCOUNTER — Emergency Department (HOSPITAL_COMMUNITY): Payer: Commercial Managed Care - PPO

## 2017-10-14 DIAGNOSIS — I10 Essential (primary) hypertension: Secondary | ICD-10-CM | POA: Insufficient documentation

## 2017-10-14 DIAGNOSIS — X500XXA Overexertion from strenuous movement or load, initial encounter: Secondary | ICD-10-CM | POA: Diagnosis not present

## 2017-10-14 DIAGNOSIS — Z87891 Personal history of nicotine dependence: Secondary | ICD-10-CM | POA: Diagnosis not present

## 2017-10-14 DIAGNOSIS — Y999 Unspecified external cause status: Secondary | ICD-10-CM | POA: Diagnosis not present

## 2017-10-14 DIAGNOSIS — S99911A Unspecified injury of right ankle, initial encounter: Secondary | ICD-10-CM | POA: Diagnosis present

## 2017-10-14 DIAGNOSIS — Y9389 Activity, other specified: Secondary | ICD-10-CM | POA: Insufficient documentation

## 2017-10-14 DIAGNOSIS — M25571 Pain in right ankle and joints of right foot: Secondary | ICD-10-CM

## 2017-10-14 DIAGNOSIS — E119 Type 2 diabetes mellitus without complications: Secondary | ICD-10-CM | POA: Diagnosis not present

## 2017-10-14 DIAGNOSIS — S86091A Other specified injury of right Achilles tendon, initial encounter: Secondary | ICD-10-CM | POA: Diagnosis not present

## 2017-10-14 DIAGNOSIS — Z79899 Other long term (current) drug therapy: Secondary | ICD-10-CM | POA: Diagnosis not present

## 2017-10-14 DIAGNOSIS — Y929 Unspecified place or not applicable: Secondary | ICD-10-CM | POA: Diagnosis not present

## 2017-10-14 DIAGNOSIS — S86011A Strain of right Achilles tendon, initial encounter: Secondary | ICD-10-CM

## 2017-10-14 MED ORDER — KETOROLAC TROMETHAMINE 60 MG/2ML IM SOLN
30.0000 mg | Freq: Once | INTRAMUSCULAR | Status: AC
  Administered 2017-10-14: 30 mg via INTRAMUSCULAR
  Filled 2017-10-14: qty 2

## 2017-10-14 NOTE — ED Triage Notes (Signed)
Patient c/o right ankle pain after trip and fall this morning. Hx injury in July.

## 2017-10-14 NOTE — ED Provider Notes (Signed)
[facility] DEPT Provider Note  CSN: [id number] Arrival date & time: 10/14/17 9326  Chief Complaint(s) Ankle Pain  HPI Vanessa Barajas is a 46 y.o. female who presents with right ankle pain following mechanical fall while walking up the stairs.  Patient reports that she was trying to walk up she twisted her ankle causing her to fall.  She reports that she had a mechanical fall approximately in June of this year resulting in persistent right ankle pain.  She has been worked up by orthopedic surgery and diagnosed with bone spurs.  She is received a dose of intra-articular steroids 2 months ago that provided some mild relief.  Denies any exacerbation of the pain prior to the fall, ankle swelling or redness.  No fevers.  Her pain today is exacerbated with ambulation and range of motion of the ankle.  Denies any other trauma.  No other alleviating or aggravating factors.  No other physical complaints.  HPI  Past Medical History Past Medical History:  Diagnosis Date  . Anxiety   . Diabetes mellitus without complication ([place])   . Hemorrhoids   . Hypertension    Patient Active Problem List   Diagnosis Date Noted  . Depression with anxiety 02/07/2016  . Angioedema 02/07/2016  . Angioedema of lips 02/07/2016  . Diabetes mellitus without complication ([place])   . Hypertension   . Essential hypertension    Home Medication(s) Prior to Admission medications   Medication Sig Start Date End Date Taking? Authorizing Provider  acetaminophen (TYLENOL) 500 MG tablet Take 1,000 mg by mouth every 6 (six) hours as needed for moderate pain.    [provider]  ALPRAZolam (XANAX XR) 1 MG 24 hr tablet Take 1 mg by mouth at bedtime.     [provider]  aspirin-acetaminophen-caffeine (EXCEDRIN MIGRAINE) [phone] MG tablet Take 2 tablets by mouth every 6 (six) hours as needed for headache.    [provider]  docusate sodium (COLACE) 100 MG capsule Take 1  capsule (100 mg total) by mouth every 12 (twelve) hours. 03/02/16   Isla Pence, MD  erythromycin ophthalmic ointment Place a 1/2 inch ribbon of ointment into the lower eyelid. X 5 days 03/13/16   Shary Decamp, PA-C  escitalopram (LEXAPRO) 20 MG tablet Take 20 mg by mouth at bedtime.     [provider]  HYDROcodone-acetaminophen (NORCO/VICODIN) 5-325 MG tablet Take 1-2 tablets by mouth every 4 (four) hours as needed. Patient not taking: Reported on 04/14/2016 04/07/16   Nona Dell, PA-C  hydrocortisone (ANUSOL-HC) 2.5 % rectal cream Apply rectally 2 times daily Patient not taking: Reported on 04/07/2016 03/02/16   Isla Pence, MD  ibuprofen (ADVIL,MOTRIN) 200 MG tablet Take 800 mg by mouth every 6 (six) hours as needed for moderate pain.    [provider]  IRON PO Take 1 tablet by mouth daily.    [provider]  losartan (COZAAR) 100 MG tablet Take 100 mg by mouth daily.    [provider]  megestrol (MEGACE) 20 MG tablet Take 2 tablets (40 mg total) by mouth 3 (three) times daily. 04/14/16   Lavonia Drafts, MD  norethindrone-ethinyl estradiol (JUNEL FE,GILDESS FE,LOESTRIN FE) 1-20 MG-MCG tablet Take 1 tablet by mouth daily. Reported on 04/14/2016    [provider]  oxyCODONE-acetaminophen (PERCOCET/ROXICET) 5-325 MG tablet Take 1-2 tablets by mouth every 4 (four) hours as needed for severe pain. Patient not taking: Reported on 04/07/2016 03/16/16   Lacretia Leigh, MD  predniSONE (  DELTASONE) 10 MG tablet Take 5 tablets (50 mg total) by mouth daily with breakfast. And decrease by one tablet daily Patient not taking: Reported on 03/02/2016 02/09/16   Orson Eva, MD  traMADol (ULTRAM) 50 MG tablet Take 50 mg by mouth every 6 (six) hours as needed for moderate pain.    [provider]                                                                                                                                    Past Surgical  History Past Surgical History:  Procedure Laterality Date  . ADENOIDECTOMY    . ECTOPIC PREGNANCY SURGERY     x2  . HERNIA REPAIR    . TONSILLECTOMY    . TUBAL LIGATION     Family History Family History  Problem Relation Age of Onset  . Leukemia Mother   . Cancer Mother 69       leukemia  . Hypertension Mother   . Cancer Sister   . Cancer Maternal Grandmother        breast    Social History Social History   Tobacco Use  . Smoking status: Former Smoker    Last attempt to quit: 09/02/2013    Years since quitting: 4.1  . Smokeless tobacco: Never Used  Substance Use Topics  . Alcohol use: No  . Drug use: No   Allergies Lisinopril  Review of Systems Review of Systems All other systems are reviewed and are negative for acute change except as noted in the HPI  Physical Exam Vital Signs  I have reviewed the triage vital signs BP (!) 150/84 (BP Location: Left Arm)   Pulse 84   Temp (!) 97.3 F (36.3 C) (Oral)   Resp 20   LMP 05/14/2017   SpO2 93%   Physical Exam  Constitutional: She is oriented to person, place, and time. She appears well-developed and well-nourished. No distress.  HENT:  Head: Normocephalic and atraumatic.  Right Ear: External ear normal.  Left Ear: External ear normal.  Nose: Nose normal.  Eyes: Conjunctivae and EOM are normal. No scleral icterus.  Neck: Normal range of motion and phonation normal.  Cardiovascular: Normal rate and regular rhythm.  Pulmonary/Chest: Effort normal. No stridor. No respiratory distress.  Abdominal: She exhibits no distension.  Musculoskeletal: Normal range of motion. She exhibits no edema.       Right ankle: She exhibits normal range of motion, no swelling, no ecchymosis, no deformity, no laceration and normal pulse. Tenderness. Lateral malleolus, medial malleolus, AITFL and proximal fibula tenderness found. No head of 5th metatarsal tenderness found. Achilles tendon exhibits pain and defect (swelling at the  distall insertion site (has been there for several months - related to her bone spur); unchnaged per pt.).  Still able to dorsi and plantar flex the right foot.  Neurological: She is alert and oriented to person, place, and time.  Skin: She is not diaphoretic.  Psychiatric: She has a normal mood and affect. Her behavior is normal.  Vitals reviewed.   ED Results and Treatments Labs (all labs ordered are listed, but only abnormal results are displayed) Labs Reviewed - No data to display                                                                                                                       EKG  EKG Interpretation  Date/Time:    Ventricular Rate:    PR Interval:    QRS Duration:   QT Interval:    QTC Calculation:   R Axis:     Text Interpretation:        Radiology Dg Ankle Complete Right  Result Date: 10/14/2017 CLINICAL DATA:  Right ankle pain secondary to a fall. EXAM: RIGHT ANKLE - COMPLETE 3+ VIEW COMPARISON:  None. FINDINGS: Prominent hypertrophy of the distal Achilles tendon with edema in the adjacent soft tissues. Dystrophic calcifications in the soft tissues of the Achilles tendon. There is no fracture or dislocation or joint effusion. Tiny marginal osteophytes at the ankle joint. No joint space narrowing. IMPRESSION: 1. Probable rupture of the degenerated hypertrophied Achilles tendon. 2. No acute bone abnormality. Minimal arthritic changes of the ankle joint. Electronically Signed   By: Lorriane Shire M.D.   On: 10/14/2017 09:24   Pertinent labs & imaging results that were available during my care of the patient were reviewed by me and considered in my medical decision making (see chart for details).  Medications Ordered in ED Medications  ketorolac (TORADOL) injection 30 mg (30 mg Intramuscular Given 10/14/17 0925)                                                                                                                                     Procedures Procedures ULTRASOUND LIMITED MUSCULOSKELETAL:  Indication: right ankle pain Linear probe used to evaluate area of interest in two planes. Findings: Disruption in the Achilles tendon with surrounding fluid collection.  Movement intact.   Performed by: Dr Leonette Monarch Images saved electronically  CPT Code: Lower extremity 36644-03  SPLINT APPLICATION Authorized by: Grayce Sessions Juan Kissoon Consent: Verbal consent obtained. Risks and benefits: risks, benefits and alternatives were discussed Consent given by: patient Splint applied by: orthopedic technician Location details: Right ankle Splint type: [place] in plantar flexion Supplies used: Ortho-Glass Post-procedure: The splinted body part was  neurovascularly unchanged following the procedure. Patient tolerance: Patient tolerated the procedure well with no immediate complications.   (including critical care time)  Medical Decision Making / ED Course I have reviewed the nursing notes for this encounter and the patient's prior records (if available in EHR or on provided paperwork).    Low suspicion for septic arthritis.  Bedside ultrasound concerning for partial Achilles tendon rupture.  Plain film revealed multiple bone spurs and concern for possible achilles tendon rupture as well.  No obvious fracture, or dislocation.  Patient provided with IM Toradol.  She already has an orthopedist today in the afternoon.patient placed in a plantar flexed splint and provided with crutches.  The patient appears reasonably screened and/or stabilized for discharge and I doubt any other medical condition or other [facility] requiring further screening, evaluation, or treatment in the ED at this time prior to discharge.  The patient is safe for discharge with strict return precautions.   Final Clinical Impression(s) / ED Diagnoses Final diagnoses:  Rupture of right Achilles tendon, initial encounter  Acute right ankle pain    Disposition:  Discharge  Condition: Good  I have discussed the results, Dx and Tx plan with the patient who expressed understanding and agree(s) with the plan. Discharge instructions discussed at great length. The patient was given strict return precautions who verbalized understanding of the instructions. No further questions at time of discharge.    ED Discharge Orders    None       Follow Up: Orthopedic surgery   As scheduled     This chart was dictated using voice recognition software.  Despite best efforts to proofread,  errors can occur which can change the documentation meaning.   Fatima Blank, MD 10/14/17 [phone]

## 2017-12-12 ENCOUNTER — Encounter (HOSPITAL_COMMUNITY): Payer: Self-pay | Admitting: Emergency Medicine

## 2017-12-12 ENCOUNTER — Emergency Department (HOSPITAL_COMMUNITY)
Admission: EM | Admit: 2017-12-12 | Discharge: 2017-12-12 | Disposition: A | Discharge status: Home / Self Care | Payer: Commercial Managed Care - PPO | Attending: Emergency Medicine | Admitting: Emergency Medicine

## 2017-12-12 DIAGNOSIS — Z87891 Personal history of nicotine dependence: Secondary | ICD-10-CM | POA: Diagnosis not present

## 2017-12-12 DIAGNOSIS — L299 Pruritus, unspecified: Secondary | ICD-10-CM | POA: Diagnosis present

## 2017-12-12 DIAGNOSIS — I10 Essential (primary) hypertension: Secondary | ICD-10-CM | POA: Insufficient documentation

## 2017-12-12 DIAGNOSIS — T782XXA Anaphylactic shock, unspecified, initial encounter: Secondary | ICD-10-CM | POA: Diagnosis not present

## 2017-12-12 DIAGNOSIS — Z79899 Other long term (current) drug therapy: Secondary | ICD-10-CM | POA: Diagnosis not present

## 2017-12-12 DIAGNOSIS — E119 Type 2 diabetes mellitus without complications: Secondary | ICD-10-CM | POA: Insufficient documentation

## 2017-12-12 MED ORDER — METHYLPREDNISOLONE SODIUM SUCC 125 MG IJ SOLR
125.0000 mg | Freq: Once | INTRAMUSCULAR | Status: AC
  Administered 2017-12-12: 125 mg via INTRAVENOUS
  Filled 2017-12-12: qty 2

## 2017-12-12 MED ORDER — ONDANSETRON HCL 4 MG/2ML IJ SOLN
4.0000 mg | Freq: Once | INTRAMUSCULAR | Status: AC
  Administered 2017-12-12: 4 mg via INTRAVENOUS
  Filled 2017-12-12: qty 2

## 2017-12-12 MED ORDER — EPINEPHRINE 0.3 MG/0.3ML IJ SOAJ: 0.3000 mg | Freq: Once | INTRAMUSCULAR | 1 refills | Status: AC

## 2017-12-12 MED ORDER — FAMOTIDINE IN NACL 20-0.9 MG/50ML-% IV SOLN
20.0000 mg | Freq: Once | INTRAVENOUS | Status: AC
  Administered 2017-12-12: 20 mg via INTRAVENOUS
  Filled 2017-12-12: qty 50

## 2017-12-12 MED ORDER — PREDNISONE 10 MG PO TABS: 40.0000 mg | ORAL_TABLET | Freq: Every day | ORAL | 0 refills | Status: AC

## 2017-12-12 NOTE — ED Triage Notes (Signed)
Per EMS, patient coming from home, c/o itching all over with "throat closing" after getting up to go to the bathroom this morning. Hives to arms and chest. Denies recent changes in meds, skin care, detergent, etc.   20g L AC 0.3mg  Epi IM 50mg  Benadryl IV

## 2017-12-12 NOTE — ED Notes (Signed)
Iv removed from left AC.

## 2017-12-12 NOTE — ED Provider Notes (Signed)
[facility] DEPT Provider Note   CSN: [id number] Arrival date & time: 12/12/17  1107     History   Chief Complaint Chief Complaint  Patient presents with  . Allergic Reaction    HPI Vanessa Barajas is a 47 y.o. female.  HPI   47 year old female with a history of diabetes, hypertension, recent Achilles repair 10/21/2017 presents with concern for allergic reaction.  Patient reports she is going to the bathroom, and suddenly developed diffuse itching all over.  Reports she noted 2 spots of erythema on her right arm, and some rash over her legs.  The itching and rash persisted and worsened, she developed lightheadedness and lay down in bed, she then developed nausea and sensation of throat closing.  She had one episode of small volume emesis.  Denies diarrhea, abdominal pain.  Reports she has shortness of breath due to the sensation of her throat closing.  She called the ambulance when the sensation of throat swelling began, and was given IM epinephrine, and 50 mg of Benadryl.  She reports that since receiving the medications her symptoms have significantly improved, although she has a mild sensation of something in her throat, and nausea.  She has never had an anaphylactic reaction or allergic reaction before.  Reports she did have angioedema associate with lisinopril in the past.  She denies any exposures prior to development of these symptoms, including no foods or medications.  She is otherwise not had any changes in her food medication, soap, detergent, or tissues.  She is not sure if she got bit by something.  She is lived with her dog for 13 years without any symptoms of allergies.  No other new exposures.  She is not sure what could have caused this allergic reaction.  Past Medical History:  Diagnosis Date  . Anxiety   . Diabetes mellitus without complication ([place])   . Hemorrhoids   . Hypertension     Patient Active Problem List   Diagnosis Date Noted  .  Depression with anxiety 02/07/2016  . Angioedema 02/07/2016  . Angioedema of lips 02/07/2016  . Diabetes mellitus without complication ([place])   . Hypertension   . Essential hypertension     Past Surgical History:  Procedure Laterality Date  . ADENOIDECTOMY    . ECTOPIC PREGNANCY SURGERY     x2  . HERNIA REPAIR    . TONSILLECTOMY    . TUBAL LIGATION      OB History    No data available       Home Medications    Prior to Admission medications   Medication Sig Start Date End Date Taking? Authorizing Provider  ALPRAZolam (XANAX XR) 2 MG 24 hr tablet Take 2 mg by mouth at bedtime.   Yes [provider]  bisacodyl (FLEET) 10 MG/30ML ENEM Place 10 mg rectally once.   Yes [provider]  cholecalciferol (VITAMIN D) 1000 units tablet Take 2,000 Units by mouth daily.   Yes [provider]  diclofenac (VOLTAREN) 75 MG EC tablet Take 75 mg by mouth 2 (two) times daily. 10/21/17  Yes [provider]  escitalopram (LEXAPRO) 20 MG tablet Take 20 mg by mouth at bedtime.    Yes [provider]  IRON PO Take 1 tablet by mouth daily.   Yes [provider]  losartan (COZAAR) 100 MG tablet Take 100 mg by mouth daily.   Yes [provider]  oxyCODONE (OXY IR/ROXICODONE) 5 MG immediate release tablet Take  5 mg by mouth every 6 (six) hours as needed for moderate pain.  12/11/17  Yes [provider]  polyethylene glycol (MIRALAX / GLYCOLAX) packet Take 17 g by mouth daily.   Yes [provider]  traMADol (ULTRAM) 50 MG tablet Take 50 mg by mouth every 6 (six) hours as needed for moderate pain.   Yes [provider]  EPINEPHrine 0.3 mg/0.3 mL IJ SOAJ injection Inject 0.3 mLs (0.3 mg total) into the muscle once for 1 dose. 12/12/17 12/12/17  Gareth Morgan, MD  predniSONE (DELTASONE) 10 MG tablet Take 5 tablets (50 mg total) by mouth daily with breakfast. And decrease by one tablet daily Patient not taking: Reported on  03/02/2016 02/09/16   Orson Eva, MD  predniSONE (DELTASONE) 10 MG tablet Take 4 tablets (40 mg total) by mouth daily for 4 days. 12/12/17 12/16/17  Gareth Morgan, MD    Family History Family History  Problem Relation Age of Onset  . Leukemia Mother   . Cancer Mother 29       leukemia  . Hypertension Mother   . Cancer Sister   . Cancer Maternal Grandmother        breast    Social History Social History   Tobacco Use  . Smoking status: Former Smoker    Last attempt to quit: 09/02/2013    Years since quitting: 4.2  . Smokeless tobacco: Never Used  Substance Use Topics  . Alcohol use: No  . Drug use: No     Allergies   Lisinopril   Review of Systems Review of Systems  Constitutional: Negative for fever.  HENT: Negative for sore throat (throat swelling).   Eyes: Negative for visual disturbance.  Respiratory: Positive for shortness of breath. Negative for cough.   Cardiovascular: Negative for chest pain.  Gastrointestinal: Positive for nausea and vomiting. Negative for abdominal pain, constipation and diarrhea.  Genitourinary: Negative for difficulty urinating.  Musculoskeletal: Negative for back pain and neck pain.  Skin: Positive for rash.  Neurological: Positive for light-headedness. Negative for syncope, numbness and headaches.     Physical Exam Updated Vital Signs BP 132/74   Pulse 80   Temp 97.9 F (36.6 C) (Oral)   Resp 18   SpO2 98%   Physical Exam  Constitutional: She is oriented to person, place, and time. She appears well-developed and well-nourished. No distress.  HENT:  Head: Normocephalic and atraumatic.  Mouth/Throat: Oropharynx is clear and moist. No oropharyngeal exudate.  Eyes: Conjunctivae and EOM are normal.  Neck: Normal range of motion.  Cardiovascular: Normal rate, regular rhythm, normal heart sounds and intact distal pulses. Exam reveals no gallop and no friction rub.  No murmur heard. Pulmonary/Chest: Effort normal and breath sounds  normal. No stridor. No respiratory distress. She has no wheezes. She has no rales.  Abdominal: Soft. She exhibits no distension. There is no tenderness. There is no guarding.  Musculoskeletal: She exhibits no edema or tenderness.  Neurological: She is alert and oriented to person, place, and time.  Skin: Skin is warm and dry. No rash noted. She is not diaphoretic. No erythema.  Mild erythema over proximal legs,distal arms  Nursing note and vitals reviewed.    ED Treatments / Results  Labs (all labs ordered are listed, but only abnormal results are displayed) Labs Reviewed - No data to display  EKG  EKG Interpretation None       Radiology No results found.  Procedures Procedures (including critical care time)  Medications Ordered  in ED Medications  methylPREDNISolone sodium succinate (SOLU-MEDROL) 125 mg/2 mL injection 125 mg (125 mg Intravenous Given 12/12/17 1129)  famotidine (PEPCID) IVPB 20 mg premix (0 mg Intravenous Stopped 12/12/17 1339)  ondansetron (ZOFRAN) injection 4 mg (4 mg Intravenous Given 12/12/17 1213)     Initial Impression / Assessment and Plan / ED Course  I have reviewed the triage vital signs and the nursing notes.  Pertinent labs & imaging results that were available during my care of the patient were reviewed by me and considered in my medical decision making (see chart for details).     47 year old female with a history of diabetes, hypertension, recent Achilles repair 10/21/2017 presents with concern for allergic reaction.  No known triggers.  Reported itching, dyspnea, nausea, lightheadedness, and throat swelling.  She was given epinephrine and Benadryl by EMS, reports significant improvement of symptoms.  While in the emergency department she had continued improvement of symptoms, and on reeval has no continuing nausea, itching, or throat swelling. No signs of rebound or continuing anaphylaxis.  Unclear trigger for reaction by history.  History is  concerning given acuity and combination of symptoms for anaphylaxis.  Gave patient Solu-Medrol, Zantac in the ED.  Give her a prescription for an EpiPen, recommend Benadryl, steroids, Zantac. Recommend allergist follow up, avoidance of triggers although this is difficult to identify at this time. Patient discharged in stable condition with understanding of reasons to return.   Final Clinical Impressions(s) / ED Diagnoses   Final diagnoses:  Anaphylaxis, initial encounter    ED Discharge Orders        Ordered    predniSONE (DELTASONE) 10 MG tablet  Daily     12/12/17 1602    EPINEPHrine 0.3 mg/0.3 mL IJ SOAJ injection   Once     12/12/17 9476       Gareth Morgan, MD 12/12/17 1725

## 2017-12-12 NOTE — ED Notes (Signed)
Bed: AQ76 Expected date:  Expected time:  Means of arrival:  Comments: Allergic reaction

## 2017-12-12 NOTE — ED Notes (Signed)
ED Provider at bedside. 

## 2017-12-12 NOTE — Discharge Instructions (Signed)
Take 25mg  of benadryl every 6 hours for 48 hours.  (take around 5:15PM today)  Take either ranitidine or famotidine (zantac or pepcid) over the counter once daily for 2 days.

## 2018-03-29 ENCOUNTER — Inpatient Hospital Stay (HOSPITAL_COMMUNITY)
Admission: AD | Admit: 2018-03-29 | Discharge: 2018-03-29 | Disposition: A | Discharge status: Home / Self Care | Payer: Commercial Managed Care - PPO | Source: Ambulatory Visit | Attending: Obstetrics and Gynecology | Admitting: Obstetrics and Gynecology

## 2018-03-29 ENCOUNTER — Encounter (HOSPITAL_COMMUNITY): Payer: Self-pay

## 2018-03-29 ENCOUNTER — Telehealth: Payer: Self-pay

## 2018-03-29 DIAGNOSIS — Z87891 Personal history of nicotine dependence: Secondary | ICD-10-CM | POA: Insufficient documentation

## 2018-03-29 DIAGNOSIS — Z8249 Family history of ischemic heart disease and other diseases of the circulatory system: Secondary | ICD-10-CM | POA: Diagnosis not present

## 2018-03-29 DIAGNOSIS — Z888 Allergy status to other drugs, medicaments and biological substances status: Secondary | ICD-10-CM | POA: Diagnosis not present

## 2018-03-29 DIAGNOSIS — I10 Essential (primary) hypertension: Secondary | ICD-10-CM | POA: Diagnosis not present

## 2018-03-29 DIAGNOSIS — Z806 Family history of leukemia: Secondary | ICD-10-CM | POA: Insufficient documentation

## 2018-03-29 DIAGNOSIS — N920 Excessive and frequent menstruation with regular cycle: Secondary | ICD-10-CM | POA: Diagnosis present

## 2018-03-29 DIAGNOSIS — F419 Anxiety disorder, unspecified: Secondary | ICD-10-CM | POA: Diagnosis not present

## 2018-03-29 DIAGNOSIS — E119 Type 2 diabetes mellitus without complications: Secondary | ICD-10-CM | POA: Insufficient documentation

## 2018-03-29 DIAGNOSIS — R109 Unspecified abdominal pain: Secondary | ICD-10-CM

## 2018-03-29 DIAGNOSIS — N921 Excessive and frequent menstruation with irregular cycle: Secondary | ICD-10-CM | POA: Diagnosis not present

## 2018-03-29 LAB — URINALYSIS, MICROSCOPIC (REFLEX)

## 2018-03-29 LAB — CBC
HEMATOCRIT: 38 % (ref 36.0–46.0)
HEMOGLOBIN: 12.6 g/dL (ref 12.0–15.0)
MCH: 31.3 pg (ref 26.0–34.0)
MCHC: 33.2 g/dL (ref 30.0–36.0)
MCV: 94.3 fL (ref 78.0–100.0)
Platelets: 211 10*3/uL (ref 150–400)
RBC: 4.03 MIL/uL (ref 3.87–5.11)
RDW: 13.2 % (ref 11.5–15.5)
WBC: 7.1 10*3/uL (ref 4.0–10.5)

## 2018-03-29 LAB — WET PREP, GENITAL
Clue Cells Wet Prep HPF POC: NONE SEEN
SPERM: NONE SEEN
TRICH WET PREP: NONE SEEN
YEAST WET PREP: NONE SEEN

## 2018-03-29 LAB — URINALYSIS, ROUTINE W REFLEX MICROSCOPIC

## 2018-03-29 LAB — POCT PREGNANCY, URINE: Preg Test, Ur: NEGATIVE

## 2018-03-29 MED ORDER — KETOROLAC TROMETHAMINE 60 MG/2ML IM SOLN
60.0000 mg | INTRAMUSCULAR | Status: AC
  Administered 2018-03-29: 60 mg via INTRAMUSCULAR
  Filled 2018-03-29: qty 2

## 2018-03-29 MED ORDER — KETOROLAC TROMETHAMINE 10 MG PO TABS: 10.0000 mg | ORAL_TABLET | Freq: Four times a day (QID) | ORAL | 0 refills | Status: DC | PRN

## 2018-03-29 NOTE — MAU Provider Note (Addendum)
History     CSN: [id number]  Arrival date and time: 03/29/18 1601   First Provider Initiated Contact with Patient 03/29/18 1016      Chief Complaint  Patient presents with  . Abdominal Pain  . Vaginal Bleeding   HPI  Ms.  Vanessa Barajas is a 47 y.o. year old G39P0010 non-pregnant female who presents to MAU reporting VB since March, but the bleeding has been heavier over the last 24 hours and increasing pain. She has an appointment to see Dr. Ihor Dow on 5/22. She called [place], but was not able to get worked in to be seen today and was advised to come to MAU. She rates her pain a 10/10. She has been on BCP (Rx'd by PCP) since March and have been bleeding every day since. She reports this has happened before and she was told she has fibroids. She has also had an EBx done for this, but does not recall any abnormal findings. She states "after I had the EBx, my bleeding stopped, but now its starting back heavy again. That's why I called to make an appt with the clinic. I called to be seen today, but they told me to come here."  Past Medical History:  Diagnosis Date  . Anxiety   . Diabetes mellitus without complication ([place])   . Hemorrhoids   . Hypertension     Past Surgical History:  Procedure Laterality Date  . ACHILLES TENDON SURGERY    . ADENOIDECTOMY    . ECTOPIC PREGNANCY SURGERY     x2  . HERNIA REPAIR    . TONSILLECTOMY    . TUBAL LIGATION      Family History  Problem Relation Age of Onset  . Leukemia Mother   . Cancer Mother 97       leukemia  . Hypertension Mother   . Cancer Sister   . Cancer Maternal Grandmother        breast    Social History   Tobacco Use  . Smoking status: Former Smoker    Last attempt to quit: 09/02/2013    Years since quitting: 4.5  . Smokeless tobacco: Never Used  Substance Use Topics  . Alcohol use: No  . Drug use: No    Allergies:  Allergies  Allergen Reactions  . Lisinopril Swelling    Angioedema     Medications  Prior to Admission  Medication Sig Dispense Refill Last Dose  . ALPRAZolam (XANAX XR) 2 MG 24 hr tablet Take 2 mg by mouth at bedtime.   12/11/2017 at Unknown time  . bisacodyl (FLEET) 10 MG/30ML ENEM Place 10 mg rectally once.   Past Week at Unknown time  . cholecalciferol (VITAMIN D) 1000 units tablet Take 2,000 Units by mouth daily.   12/11/2017 at Unknown time  . diclofenac (VOLTAREN) 75 MG EC tablet Take 75 mg by mouth 2 (two) times daily.  2 12/11/2017 at Unknown time  . escitalopram (LEXAPRO) 20 MG tablet Take 20 mg by mouth at bedtime.    12/11/2017 at Unknown time  . IRON PO Take 1 tablet by mouth daily.   12/11/2017 at Unknown time  . losartan (COZAAR) 100 MG tablet Take 100 mg by mouth daily.   12/11/2017 at Unknown time  . oxyCODONE (OXY IR/ROXICODONE) 5 MG immediate release tablet Take 5 mg by mouth every 6 (six) hours as needed for moderate pain.    12/11/2017 at Unknown time  . polyethylene glycol (MIRALAX / GLYCOLAX) packet Take 17 g  by mouth daily.   12/11/2017 at Unknown time  . predniSONE (DELTASONE) 10 MG tablet Take 5 tablets (50 mg total) by mouth daily with breakfast. And decrease by one tablet daily (Patient not taking: Reported on 03/02/2016) 15 tablet 0 Not Taking at Unknown time  . traMADol (ULTRAM) 50 MG tablet Take 50 mg by mouth every 6 (six) hours as needed for moderate pain.   Past Month at Unknown time    Review of Systems  Constitutional: Negative.   HENT: Negative.   Eyes: Negative.   Respiratory: Negative.   Cardiovascular: Negative.   Gastrointestinal: Positive for abdominal pain.  Endocrine: Negative.   Genitourinary: Positive for menstrual problem, pelvic pain and vaginal bleeding (heavy bleeding).  Musculoskeletal: Negative.   Skin: Negative.   Allergic/Immunologic: Negative.   Neurological: Positive for weakness (occasionally).  Hematological: Negative.   Psychiatric/Behavioral: Negative.    Physical Exam   Blood pressure 134/87, pulse 83, temperature 97.8 F  (36.6 C), temperature source Oral, resp. rate 18, weight 274 lb (124.3 kg), SpO2 99 %.  Physical Exam  Nursing note and vitals reviewed. Constitutional: She is oriented to person, place, and time. She appears well-developed and well-nourished.  HENT:  Head: Normocephalic and atraumatic.  Eyes: Pupils are equal, round, and reactive to light.  Neck: Normal range of motion.  Cardiovascular: Normal rate, regular rhythm, normal heart sounds and intact distal pulses.  Respiratory: Effort normal and breath sounds normal.  GI: Soft. Bowel sounds are normal.  Genitourinary:  Genitourinary Comments: Uterus: difficult to assess d/t body habitus, SE: cervix is smooth, pink, no lesions, small amt of dark, red blood -- WP, GC/CT done, closed/long/firm, no CMT or friability, mild adnexal tenderness   Musculoskeletal: Normal range of motion.  Neurological: She is alert and oriented to person, place, and time. She has normal reflexes.  Skin: Skin is warm and dry.  Psychiatric: She has a normal mood and affect. Her behavior is normal. Judgment and thought content normal.    MAU Course  Procedures  MDM CCUA UPT Toradol 60 mg IM -- improved pain                                                                                                                                                          Results for orders placed or performed during the hospital encounter of 03/29/18 (from the past 72 hour(s))  GC/Chlamydia probe amp ([place])not at [facility]     Status: None   Collection Time: 03/29/18 12:00 AM  Result Value Ref Range   Chlamydia Negative     Comment: Normal Reference Range - Negative   Neisseria gonorrhea Negative     Comment: Normal Reference Range - Negative  Urinalysis, Routine w reflex microscopic     Status: Abnormal   Collection Time: 03/29/18  9:06 AM  Result Value Ref Range   Color, Urine RED (A) YELLOW    Comment: BIOCHEMICALS MAY BE  AFFECTED BY COLOR   APPearance TURBID (A) CLEAR   Specific Gravity, Urine  1.005 - 1.030    TEST NOT REPORTED DUE TO COLOR INTERFERENCE OF URINE PIGMENT   pH  5.0 - 8.0    TEST NOT REPORTED DUE TO COLOR INTERFERENCE OF URINE PIGMENT   Glucose, UA (A) NEGATIVE mg/dL    TEST NOT REPORTED DUE TO COLOR INTERFERENCE OF URINE PIGMENT   Hgb urine dipstick (A) NEGATIVE    TEST NOT REPORTED DUE TO COLOR INTERFERENCE OF URINE PIGMENT   Bilirubin Urine (A) NEGATIVE    TEST NOT REPORTED DUE TO COLOR INTERFERENCE OF URINE PIGMENT   Ketones, ur (A) NEGATIVE mg/dL    TEST NOT REPORTED DUE TO COLOR INTERFERENCE OF URINE PIGMENT   Protein, ur (A) NEGATIVE mg/dL    TEST NOT REPORTED DUE TO COLOR INTERFERENCE OF URINE PIGMENT   Nitrite (A) NEGATIVE    TEST NOT REPORTED DUE TO COLOR INTERFERENCE OF URINE PIGMENT   Leukocytes, UA (A) NEGATIVE    TEST NOT REPORTED DUE TO COLOR INTERFERENCE OF URINE PIGMENT    Comment: Performed at [facility], [address] [place], [place] [address]  Urinalysis, Microscopic (reflex)     Status: Abnormal   Collection Time: 03/29/18  9:06 AM  Result Value Ref Range   RBC / HPF >50 0 - 5 RBC/hpf   WBC, UA 0-5 0 - 5 WBC/hpf   Bacteria, UA RARE (A) NONE SEEN   Squamous Epithelial / LPF 0-5 0 - 5   Non Squamous Epithelial PRESENT (A) NONE SEEN    Comment: Performed at [facility], [address] [place], [place] [address]  Pregnancy, urine POC     Status: None   Collection Time: 03/29/18  9:14 AM  Result Value Ref Range   Preg Test, Ur NEGATIVE NEGATIVE    Comment:        THE SENSITIVITY OF THIS METHODOLOGY IS >24 mIU/mL   Wet prep, genital     Status: Abnormal   Collection Time: 03/29/18 10:57 AM  Result Value Ref Range   Yeast Wet Prep HPF POC NONE SEEN NONE SEEN   Trich, Wet Prep NONE SEEN NONE SEEN   Clue Cells Wet Prep HPF POC NONE SEEN NONE SEEN   WBC, Wet Prep HPF POC FEW (A) NONE SEEN    Comment: MODERATE BACTERIA SEEN   Sperm NONE SEEN      Comment: Performed at [facility], [address], [place], [place] [address]  CBC     Status: None   Collection Time: 03/29/18 10:58 AM  Result Value Ref Range   WBC 7.1 4.0 - 10.5 K/uL   RBC 4.03 3.87 - 5.11 MIL/uL   Hemoglobin 12.6 12.0 - 15.0 g/dL   HCT 38.0 36.0 - 46.0 %   MCV 94.3 78.0 - 100.0 fL   MCH 31.3 26.0 - 34.0 pg   MCHC 33.2 30.0 - 36.0 g/dL   RDW 13.2 11.5 - 15.5 %   Platelets 211 150 - 400 K/uL    Comment: Performed at [facility], [address], [place], [place] [address]  HIV antibody     Status: None   Collection Time: 03/29/18 10:58 AM  Result Value Ref Range   HIV Screen 4th Generation wRfx Non Reactive Non Reactive    Comment: (NOTE) Performed At: [facility]  [place], [place] [id number] Rush Farmer MD IV:[id number] Performed at [facility], [address], [place], [place] [address]     Assessment and Plan  Menorrhagia with irregular cycle  - Information provided on menorrhagia and AUB - Reassurance ginve that bleeding seen on exam was not considered to be too heavy and CBC   - Rx for Toradol 10 mg every 6 hrs prn pain - Keep appt with Dr. Ihor Dow on 5/22 - Discharge patient - Patient verbalized an understanding of the plan of care and agrees.    Laury Deep, MSN, CNM 03/29/2018, 10:17 AM

## 2018-03-29 NOTE — Telephone Encounter (Signed)
Patient called stating that she has had heavy bleeding for about 24 hours now. Patient states that she has an appointment with [place] on Wednesday this week but is soaking a heaving pad every thirty minutes. Patient states she is also having heavy cramping Advised patient to go to [facility] hospital for evaluation due to the amount of heavy bleeding she is having. Kathrene Alu RN

## 2018-03-29 NOTE — MAU Note (Addendum)
Pt has been having vaginal bleeding since March. Has an appointment with Dr. Ihor Dow on Wednesday but has been bleeding heavier the last 24 hours and increasing pain. Called the office but could not work her in today so they advised she came here. Pain 10/10 Been on BCP since march and have been bleeding every day since.

## 2018-03-30 LAB — GC/CHLAMYDIA PROBE AMP (~~LOC~~) NOT AT ARMC
Chlamydia: NEGATIVE
Neisseria Gonorrhea: NEGATIVE

## 2018-03-30 LAB — HIV ANTIBODY (ROUTINE TESTING W REFLEX): HIV SCREEN 4TH GENERATION: NONREACTIVE

## 2018-03-31 ENCOUNTER — Encounter: Payer: Self-pay | Admitting: Obstetrics & Gynecology

## 2018-03-31 ENCOUNTER — Ambulatory Visit (INDEPENDENT_AMBULATORY_CARE_PROVIDER_SITE_OTHER): Payer: Commercial Managed Care - PPO | Admitting: Obstetrics & Gynecology

## 2018-03-31 VITALS — BP 160/80 | HR 82 | Wt 277.0 lb

## 2018-03-31 DIAGNOSIS — N852 Hypertrophy of uterus: Secondary | ICD-10-CM

## 2018-03-31 DIAGNOSIS — N921 Excessive and frequent menstruation with irregular cycle: Secondary | ICD-10-CM | POA: Diagnosis not present

## 2018-03-31 MED ORDER — MEGESTROL ACETATE 40 MG PO TABS: 40.0000 mg | ORAL_TABLET | Freq: Two times a day (BID) | ORAL | 5 refills | Status: DC

## 2018-03-31 NOTE — Progress Notes (Signed)
History:  47 y.o. G2P0010 here today for eval of abnormal bleeding. Pt reports that she went 8 months with no bleeding but, now has had bleeding since March (2 months).  She reports that it got really heavy 5 days prev with he passage of large clots and she went to the MAU. She prev had a endo bx which was neg. She recently stopped smoking in Jan 2019.  Her primary care provider put her on OCPs to help control her cycles with no relief of sx.    The following portions of the patient's history were reviewed and updated as appropriate: allergies, current medications, past family history, past medical history, past social history, past surgical history and problem list.  Review of Systems:  Pertinent items are noted in HPI.    Objective:  Physical Exam Blood pressure (!) 160/80, pulse 82, weight 277 lb (125.6 kg).  CONSTITUTIONAL: Well-developed, well-nourished female in no acute distress.  HENT:  Normocephalic, atraumatic EYES: Conjunctivae and EOM are normal. No scleral icterus.  NECK: Normal range of motion SKIN: Skin is warm and dry. No rash noted. Not diaphoretic.No pallor. [place]: Alert and oriented to person, place, and time. Normal coordination.  Abd: Soft, obese, nontender and nondistended. NO mass effect noted.  Pelvic: Normal appearing external genitalia; blood in vualt.  The uterus is enlarged. It is difficult to fully assess due to body habitus.  No adnexal tenderness noted.   Labs and Imaging 04/14/2016 Diagnosis Endometrium, biopsy - PROLIFERATIVE-TYPE ENDOMETRIUM WITH BREAKDOWN. - THERE IS NO EVIDENCE OF HYPERPLASIA OR MALIGNANCY. - SEE COMMENT. Microscopic Comment Sections show fragments of benign proliferative-type endometrium in which there are foci of breakdown with fragmented glands and condensed stroma. The features suggest anovulatory or estrogen withdrawal bleeding  Assessment & Plan:  AUB- I suspect that is due to perimenopause however, she has an enlarge  uterus that suggests fibroids.  Labs: TSH, FSH Megace 40mg  bid  F/u in 3 months or sooner pnr  If bleeding not controled with Megace or if [place] abnormal l will repeat endo bx.   Total face-to-face time with patient was 15 min.  Greater than 50% was spent in counseling and coordination of care with the patient.   Dimitra Woodstock L. Harraway-Smith, M.D., Cherlynn June

## 2018-03-31 NOTE — Progress Notes (Signed)
Patient has had cramping and bleeding for several days. Patient states that she did add taking a new pack of birth control pills and it helped her bleeding a little bit. Patient states today she has had some clots the size of quarters today.  Patient complaining of fatigue. Kathrene Alu RN

## 2018-04-01 LAB — TSH: TSH: 4.69 u[IU]/mL — ABNORMAL HIGH (ref 0.450–4.500)

## 2018-04-01 LAB — FOLLICLE STIMULATING HORMONE: FSH: 7.3 m[IU]/mL

## 2018-04-03 ENCOUNTER — Ambulatory Visit (HOSPITAL_BASED_OUTPATIENT_CLINIC_OR_DEPARTMENT_OTHER): Payer: Commercial Managed Care - PPO

## 2018-04-03 ENCOUNTER — Encounter (HOSPITAL_BASED_OUTPATIENT_CLINIC_OR_DEPARTMENT_OTHER): Payer: Self-pay

## 2018-04-07 ENCOUNTER — Telehealth: Payer: Self-pay

## 2018-04-07 NOTE — Telephone Encounter (Signed)
Called [facility] to add on T3 and T4 per Dr. Ihor Dow request. Kathrene Alu RN

## 2018-04-08 LAB — T3: T3, Total: 128 ng/dL (ref 71–180)

## 2018-04-08 LAB — SPECIMEN STATUS REPORT

## 2018-04-08 LAB — T4: T4, Total: 7.3 ug/dL (ref 4.5–12.0)

## 2018-04-13 ENCOUNTER — Telehealth: Payer: Self-pay

## 2018-04-13 NOTE — Telephone Encounter (Signed)
Patient called and made aware of normal thyroid T3 and T4 studies. Kathrene Alu RN

## 2018-04-13 NOTE — Telephone Encounter (Signed)
-----   Message from Lavonia Drafts, MD sent at 04/13/2018 10:29 AM EDT ----- Please call pt to inform her that her Thyroid studies came back WNL.   Thx, clh-S

## 2018-08-19 ENCOUNTER — Telehealth: Payer: Self-pay

## 2018-08-19 DIAGNOSIS — N852 Hypertrophy of uterus: Secondary | ICD-10-CM

## 2018-08-19 DIAGNOSIS — N921 Excessive and frequent menstruation with irregular cycle: Secondary | ICD-10-CM

## 2018-08-19 NOTE — Telephone Encounter (Signed)
Patient calling stating that she is having heavy bleeding again and the Megace is not working. Patient never got her ultrasound in May 2019. Patient states she would like to reschedule this ultrasound and reschedule her follow up.  Kathrene Alu RN

## 2018-08-20 ENCOUNTER — Ambulatory Visit: Payer: Commercial Managed Care - PPO | Admitting: Obstetrics & Gynecology

## 2018-08-24 ENCOUNTER — Ambulatory Visit (HOSPITAL_BASED_OUTPATIENT_CLINIC_OR_DEPARTMENT_OTHER): Payer: Commercial Managed Care - PPO

## 2018-08-24 ENCOUNTER — Ambulatory Visit (HOSPITAL_BASED_OUTPATIENT_CLINIC_OR_DEPARTMENT_OTHER)
Admission: RE | Admit: 2018-08-24 | Discharge: 2018-08-24 | Disposition: A | Discharge status: Home / Self Care | Payer: Commercial Managed Care - PPO | Source: Ambulatory Visit | Attending: Obstetrics & Gynecology | Admitting: Obstetrics & Gynecology

## 2018-08-24 DIAGNOSIS — D259 Leiomyoma of uterus, unspecified: Secondary | ICD-10-CM | POA: Insufficient documentation

## 2018-08-24 DIAGNOSIS — N852 Hypertrophy of uterus: Secondary | ICD-10-CM | POA: Insufficient documentation

## 2018-08-24 DIAGNOSIS — N921 Excessive and frequent menstruation with irregular cycle: Secondary | ICD-10-CM | POA: Diagnosis present

## 2018-08-30 ENCOUNTER — Telehealth: Payer: Self-pay

## 2018-08-30 NOTE — Telephone Encounter (Signed)
Patient called and made aware of small fibroid but that Dr. Ihor Dow would like her to continue the Megace. Patient states she has a follow up about her ultrasound this Wednesday and she plans to keep appointment to discuss further. Kathrene Alu RN

## 2018-08-30 NOTE — Telephone Encounter (Signed)
-----   Message from Lavonia Drafts, MD sent at 08/29/2018  1:06 PM EDT ----- Please call pt. She does have some small fibroids. They are probably not her problem at present. She should cont the Megace and f/u in 3 months as scheduled.   Please f/u sooner prn  Thx, clh-S

## 2018-09-01 ENCOUNTER — Ambulatory Visit: Payer: Commercial Managed Care - PPO | Admitting: Obstetrics & Gynecology

## 2018-09-01 DIAGNOSIS — Z09 Encounter for follow-up examination after completed treatment for conditions other than malignant neoplasm: Secondary | ICD-10-CM

## 2018-09-13 ENCOUNTER — Encounter (HOSPITAL_COMMUNITY): Payer: Self-pay | Admitting: Obstetrics and Gynecology

## 2018-09-13 ENCOUNTER — Emergency Department (HOSPITAL_COMMUNITY)
Admission: EM | Admit: 2018-09-13 | Discharge: 2018-09-13 | Disposition: A | Discharge status: Home / Self Care | Payer: Commercial Managed Care - PPO | Attending: Emergency Medicine | Admitting: Emergency Medicine

## 2018-09-13 ENCOUNTER — Other Ambulatory Visit: Payer: Self-pay

## 2018-09-13 DIAGNOSIS — T7840XA Allergy, unspecified, initial encounter: Secondary | ICD-10-CM | POA: Diagnosis present

## 2018-09-13 DIAGNOSIS — T783XXA Angioneurotic edema, initial encounter: Secondary | ICD-10-CM | POA: Diagnosis not present

## 2018-09-13 DIAGNOSIS — E119 Type 2 diabetes mellitus without complications: Secondary | ICD-10-CM | POA: Insufficient documentation

## 2018-09-13 DIAGNOSIS — I10 Essential (primary) hypertension: Secondary | ICD-10-CM | POA: Diagnosis not present

## 2018-09-13 DIAGNOSIS — Z79899 Other long term (current) drug therapy: Secondary | ICD-10-CM | POA: Diagnosis not present

## 2018-09-13 DIAGNOSIS — Z87891 Personal history of nicotine dependence: Secondary | ICD-10-CM | POA: Insufficient documentation

## 2018-09-13 LAB — COMPREHENSIVE METABOLIC PANEL
ALBUMIN: 3.8 g/dL (ref 3.5–5.0)
ALT: 20 U/L (ref 0–44)
AST: 20 U/L (ref 15–41)
Alkaline Phosphatase: 77 U/L (ref 38–126)
Anion gap: 12 (ref 5–15)
BILIRUBIN TOTAL: 0.5 mg/dL (ref 0.3–1.2)
BUN: 14 mg/dL (ref 6–20)
CALCIUM: 8.5 mg/dL — AB (ref 8.9–10.3)
CO2: 19 mmol/L — ABNORMAL LOW (ref 22–32)
CREATININE: 1.01 mg/dL — AB (ref 0.44–1.00)
Chloride: 105 mmol/L (ref 98–111)
GFR calc Af Amer: >60 mL/min (ref >60)
GFR calc non Af Amer: >60 mL/min (ref >60)
Glucose, Bld: 266 mg/dL — ABNORMAL HIGH (ref 70–99)
Potassium: 3.1 mmol/L — ABNORMAL LOW (ref 3.5–5.1)
Sodium: 136 mmol/L (ref 135–145)
Total Protein: 7 g/dL (ref 6.5–8.1)

## 2018-09-13 LAB — CBC WITH DIFFERENTIAL/PLATELET
ABS IMMATURE GRANULOCYTES: 0.08 10*3/uL — AB (ref 0.00–0.07)
Basophils Absolute: 0 10*3/uL (ref 0.0–0.1)
Basophils Relative: 0 %
Eosinophils Absolute: 0.2 10*3/uL (ref 0.0–0.5)
Eosinophils Relative: 1 %
HEMATOCRIT: 44.9 % (ref 36.0–46.0)
HEMOGLOBIN: 14.7 g/dL (ref 12.0–15.0)
Immature Granulocytes: 1 %
LYMPHS ABS: 4.6 10*3/uL — AB (ref 0.7–4.0)
LYMPHS PCT: 35 %
MCH: 30.4 pg (ref 26.0–34.0)
MCHC: 32.7 g/dL (ref 30.0–36.0)
MCV: 93 fL (ref 80.0–100.0)
MONO ABS: 0.8 10*3/uL (ref 0.1–1.0)
Monocytes Relative: 6 %
NEUTROS ABS: 7.3 10*3/uL (ref 1.7–7.7)
Neutrophils Relative %: 57 %
Platelets: 292 10*3/uL (ref 150–400)
RBC: 4.83 MIL/uL (ref 3.87–5.11)
RDW: 13.7 % (ref 11.5–15.5)
WBC: 12.9 10*3/uL — ABNORMAL HIGH (ref 4.0–10.5)
nRBC: 0 % (ref 0.0–0.2)

## 2018-09-13 MED ORDER — METHYLPREDNISOLONE SODIUM SUCC 125 MG IJ SOLR
125.0000 mg | Freq: Once | INTRAMUSCULAR | Status: AC
  Administered 2018-09-13: 125 mg via INTRAVENOUS
  Filled 2018-09-13: qty 2

## 2018-09-13 MED ORDER — DIPHENHYDRAMINE HCL 50 MG/ML IJ SOLN
25.0000 mg | Freq: Once | INTRAMUSCULAR | Status: AC
  Administered 2018-09-13: 25 mg via INTRAVENOUS
  Filled 2018-09-13: qty 1

## 2018-09-13 MED ORDER — TRANEXAMIC ACID-NACL 1000-0.7 MG/100ML-% IV SOLN
1000.0000 mg | Freq: Once | INTRAVENOUS | Status: AC
  Administered 2018-09-13: 1000 mg via INTRAVENOUS
  Filled 2018-09-13: qty 100

## 2018-09-13 MED ORDER — AMLODIPINE BESYLATE 5 MG PO TABS: 5.0000 mg | ORAL_TABLET | Freq: Every day | ORAL | 0 refills | Status: DC

## 2018-09-13 MED ORDER — FAMOTIDINE IN NACL 20-0.9 MG/50ML-% IV SOLN
20.0000 mg | Freq: Once | INTRAVENOUS | Status: AC
  Administered 2018-09-13: 20 mg via INTRAVENOUS
  Filled 2018-09-13: qty 50

## 2018-09-13 MED ORDER — ONDANSETRON HCL 4 MG/2ML IJ SOLN
4.0000 mg | Freq: Once | INTRAMUSCULAR | Status: AC
  Administered 2018-09-13: 4 mg via INTRAVENOUS
  Filled 2018-09-13: qty 2

## 2018-09-13 NOTE — ED Notes (Signed)
Bed: HU83 Expected date:  Expected time:  Means of arrival:  Comments: 42F Angioedema

## 2018-09-13 NOTE — ED Provider Notes (Signed)
MSE was initiated and I personally evaluated the patient and placed orders (if any) at  6:50 AM on September 13, 2018.  Patient awoke this morning around 6 AM with sensation of tongue swelling.  She is had this happen in the past which is attributed to lisinopril but she still takes losartan.  She gave herself epinephrine pen and call 911.  On arrival she is in no distress.  She has a large tongue but there is no drooling and there is no sublingual swelling.  She is speaking in full sentences.  Her posterior pharynx is able to be visualized and there is no swelling of her uvula. Lungs are clear without wheezing.  No rash.  Patient will be treated for likely angioedema secondary to ARB.  Will give steroids, antihistamines and TXA.  Will need prolonged monitoring to ensure this is not worsening.  The patient appears stable so that the remainder of the MSE may be completed by another provider.   Ezequiel Essex, MD 09/13/18 [phone]

## 2018-09-13 NOTE — Discharge Instructions (Addendum)
You were evaluated today for angioedema.  This is most likely from your blood pressure medicine.  Please stop taking your losartan.  I have prescribed you Norvasc 5 mg.  Please take once daily.  Please help with your primary care provider for reevaluation.  Please continue to take 5 mg of Benadryl every 8 hours for the next 3 days.  Return to the ED for any new or worsening symptoms.

## 2018-09-13 NOTE — ED Triage Notes (Signed)
Per EMS: Pt woke up this morning with tongue swelling. Pt reports she took a shower and felt like the swelling was getting worse so she administered her epi-pen and called 911.  Pt reports some relief to EMS.

## 2018-09-13 NOTE — ED Provider Notes (Signed)
[facility] DEPT Provider Note   CSN: [id number] Arrival date & time: 09/13/18  7824   History   Chief Complaint Chief Complaint  Patient presents with  . Angioedema    HPI Vanessa Barajas is a 47 y.o. female with past medical history significant for DM, HTN, anxiety and angioedema with Lisinopril who presents for evaluation of tongue swelling. Started approximately 1 hours PTA. States she awoke to get ready for work and  noticed her tongue started to swell. The sensation worsened as she was in the shower and she began to feel "itching all over." She promptly used her EpiPen and called EMS. Denies feeling of throat swelling, SOB, nausea, vomiting, chest pain, SOB, diarrhea, fever, chills, drooling, trouble swallowing, voice changes. Admits to hx of previous similar symptoms approximately 1 year ago. States she was diagnosed with angioedema from Lisinopril. Was prescribed an ARB after being taking off Lisinopril. States she last took her BP medications yesterday morning.  Denies aggravating or alleviating factors.  History was obtained from patient. No interpretor was used.    HPI  Past Medical History:  Diagnosis Date  . Anxiety   . Diabetes mellitus without complication ([place])   . Hemorrhoids   . Hypertension     Patient Active Problem List   Diagnosis Date Noted  . Menorrhagia 03/29/2018  . Depression with anxiety 02/07/2016  . Angioedema 02/07/2016  . Angioedema of lips 02/07/2016  . Diabetes mellitus without complication ([place])   . Hypertension   . Essential hypertension     Past Surgical History:  Procedure Laterality Date  . ACHILLES TENDON SURGERY    . ADENOIDECTOMY    . ECTOPIC PREGNANCY SURGERY     x2  . HERNIA REPAIR    . TONSILLECTOMY    . TUBAL LIGATION       OB History    Gravida  2   Para      Term      Preterm      AB  1   Living        SAB      TAB      Ectopic  1   Multiple      Live Births               Home Medications    Prior to Admission medications   Medication Sig Start Date End Date Taking? Authorizing Provider  ALPRAZolam (XANAX XR) 2 MG 24 hr tablet Take 2 mg by mouth at bedtime.   Yes [provider]  diclofenac (VOLTAREN) 75 MG EC tablet Take 75 mg by mouth 2 (two) times daily. 10/21/17  Yes [provider]  escitalopram (LEXAPRO) 20 MG tablet Take 20 mg by mouth at bedtime.    Yes [provider]  fluticasone (FLONASE) 50 MCG/ACT nasal spray Place 2 sprays into both nostrils daily as needed for allergies or rhinitis.   Yes [provider]  gabapentin (NEURONTIN) 100 MG capsule Take 100-200 mg by mouth at bedtime as needed (restless leg).  02/12/18  Yes [provider]  ibuprofen (ADVIL,MOTRIN) 200 MG tablet Take 600 mg by mouth every 6 (six) hours as needed for mild pain.   Yes [provider]  megestrol (MEGACE) 40 MG tablet Take 1 tablet (40 mg total) by mouth 2 (two) times daily. Can increase to two tablets twice a day in the event of heavy bleeding 03/31/18  Yes Lavonia Drafts, MD  metFORMIN (GLUCOPHAGE) 1000 MG tablet  Take 1,000 mg by mouth 2 (two) times daily with a meal.  09/10/18  Yes [provider]  polyethylene glycol (MIRALAX / GLYCOLAX) packet Take 17 g by mouth daily.   Yes [provider]  traMADol (ULTRAM) 50 MG tablet Take 50 mg by mouth every 6 (six) hours as needed for moderate pain.   Yes [provider]  amLODipine (NORVASC) 5 MG tablet Take 1 tablet (5 mg total) by mouth daily. 09/13/18   Tyanne Derocher A, PA-C  ketorolac (TORADOL) 10 MG tablet Take 1 tablet (10 mg total) by mouth every 6 (six) hours as needed for moderate pain. Patient not taking: Reported on 09/13/2018 03/29/18   Laury Deep, CNM  predniSONE (DELTASONE) 10 MG tablet Take 5 tablets (50 mg total) by mouth daily with breakfast. And decrease by one tablet daily Patient not taking: Reported on  03/31/2018 02/09/16   Orson Eva, MD    Family History Family History  Problem Relation Age of Onset  . Leukemia Mother   . Cancer Mother 4       leukemia  . Hypertension Mother   . Cancer Sister   . Cancer Maternal Grandmother        breast    Social History Social History   Tobacco Use  . Smoking status: Former Smoker    Last attempt to quit: 09/02/2013    Years since quitting: 5.0  . Smokeless tobacco: Never Used  Substance Use Topics  . Alcohol use: No  . Drug use: No     Allergies   Lisinopril and Losartan   Review of Systems Review of Systems  Constitutional: Negative.   HENT: Negative for drooling, sinus pressure, sinus pain, tinnitus, trouble swallowing and voice change.        Tongue swelling.  Respiratory: Negative.   Cardiovascular: Negative.   Gastrointestinal: Negative.   Genitourinary: Negative.   Musculoskeletal: Negative.   Neurological: Negative.   All other systems reviewed and are negative.    Physical Exam Updated Vital Signs BP (!) 134/97   Pulse 92   Temp 98.6 F (37 C) (Oral)   Resp 16   Ht 5\' 7"  (1.702 m)   Wt 125.6 kg   SpO2 97%   BMI 43.38 kg/m   Physical Exam  Constitutional: Vital signs are normal. She appears well-developed and well-nourished.  Non-toxic appearance. She does not have a sickly appearance. She does not appear ill. No distress.  HENT:  Head: Atraumatic.  Nose: Nose normal.  Mouth/Throat: Uvula is midline, oropharynx is clear and moist and mucous membranes are normal. No trismus in the jaw. No uvula swelling.  Tongue with mild swelling. Able to view the posterior pharynx. No drooling. No hot potato voice.  No evidence of facial swelling.  Uvula midline without swelling.  Eyes: Pupils are equal, round, and reactive to light.  Neck: Normal range of motion, full passive range of motion without pain and phonation normal. No edema and no erythema present.  Cardiovascular: Normal rate, normal heart sounds, intact  distal pulses and normal pulses.  Pulmonary/Chest: No respiratory distress.  Lungs clear to ascultation bilaterally. No decreased breath sounds. No rales, rhonchi or wheeze. No tachypnea. Oxygen saturation 98% on RA. Able to speak in full sentences without difficulty. No accessory muscle usage.  Abdominal: She exhibits no distension.  Musculoskeletal: Normal range of motion.  Neurological: She is alert.  Skin: Skin is warm and dry. She is not diaphoretic.  No evidence of rashes, urticaria, lesions.  Psychiatric: She has a normal mood and affect.  Nursing note and vitals reviewed.    ED Treatments / Results  Labs (all labs ordered are listed, but only abnormal results are displayed) Labs Reviewed  CBC WITH DIFFERENTIAL/PLATELET - Abnormal; Notable for the following components:      Result Value   WBC 12.9 (*)    Lymphs Abs 4.6 (*)    Abs Immature Granulocytes 0.08 (*)    All other components within normal limits  COMPREHENSIVE METABOLIC PANEL - Abnormal; Notable for the following components:   Potassium 3.1 (*)    CO2 19 (*)    Glucose, Bld 266 (*)    Creatinine, Ser 1.01 (*)    Calcium 8.5 (*)    All other components within normal limits    EKG None  Radiology No results found.  Procedures Procedures (including critical care time)  Medications Ordered in ED Medications  tranexamic acid (CYKLOKAPRON) IVPB 1,000 mg (0 mg Intravenous Stopped 09/13/18 0742)  methylPREDNISolone sodium succinate (SOLU-MEDROL) 125 mg/2 mL injection 125 mg (125 mg Intravenous Given 09/13/18 0645)  famotidine (PEPCID) IVPB 20 mg premix (0 mg Intravenous Stopped 09/13/18 0723)  diphenhydrAMINE (BENADRYL) injection 25 mg (25 mg Intravenous Given 09/13/18 0643)  ondansetron (ZOFRAN) injection 4 mg (4 mg Intravenous Given 09/13/18 0743)     Initial Impression / Assessment and Plan / ED Course  I have reviewed the triage vital signs and the nursing notes.  Pertinent labs & imaging results that were  available during my care of the patient were reviewed by me and considered in my medical decision making (see chart for details).  47 year old who appears otherwise well presents for evaluation of tongue swelling. Hx angioedema with Lisinpril. Tongue with mild swelling on exam. Able to visualize posterior pharynx. Denies sensation of throat swelling, chest pain, SOB. Able to speak in full sentences without difficulty.  Hemodynamically stable.  No evidence of urticaria on skin exam.  Afebrile, nonseptic, non-ill-appearing.  0800: On reevaluation patient is asymptomatic, able to speak in full sentences and is without acute respiratory distress.  0900: On reevaluation patient is asymptomatic, able to speak in full sentences and is without acute respiratory distress. Labs with mildly elevated WBC at 12.9. Glucose 266, hx of diabetes. PCP to start patient on Metformin 09/10/18, although she has not started taking it. Hypokalemia at 3.1. Patient does not want supplementation in the ED. Discussed follow up with PCP next week for re-evaluation of labs.  1000: Patient continues to be asymptomatic. Tongue with decrease in size from previous exams. No facial swelling, sensation of throat swelling, SOB. Able to speak in full sentences without difficulty.  No tachypnea. Oxygen saturation 97% on RA with good wave form. Patient has been watched for 4 hours and continues to be asymptomatic. Patient re-evaluated prior to dc, is hemodynamically stable, in no respiratory distress, and denies the feeling of throat closing. Pt has been advised to take OTC benadryl & return to the ED if they have a mod-severe allergic rxn (s/s including throat closing, difficulty breathing, swelling of lips face or tongue). Will dc use of ARB and place on Norvasc for BP control. Discussed follow up with PCP for reevaluation.  Strict return precautions given.  Pt is agreeable with plan & verbalizes understanding.    Final Clinical Impressions(s)  / ED Diagnoses   Final diagnoses:  Angioedema, initial encounter    ED Discharge Orders         Ordered  amLODipine (NORVASC) 5 MG tablet  Daily     09/13/18 1037           Ceejay Kegley A, PA-C 09/13/18 1049    Rancour, Annie Main, MD 09/14/18 [phone]

## 2018-09-14 ENCOUNTER — Telehealth (HOSPITAL_BASED_OUTPATIENT_CLINIC_OR_DEPARTMENT_OTHER): Payer: Self-pay | Admitting: Emergency Medicine

## 2018-09-14 MED ORDER — PREDNISONE 10 MG PO TABS: 50.0000 mg | ORAL_TABLET | Freq: Every day | ORAL | 0 refills | Status: DC

## 2018-09-14 MED ORDER — EPINEPHRINE 0.3 MG/0.3ML IJ SOAJ: 0.3000 mg | Freq: Once | INTRAMUSCULAR | 0 refills | Status: AC

## 2018-09-14 NOTE — Telephone Encounter (Signed)
Inadvertently discharged without steroid prescription or refill of epipen. These were prescribed electronically.

## 2018-12-20 IMAGING — US US PELVIS COMPLETE
1 series · 14 of 25 positions shown · non-contrast
Comparison: None

CLINICAL DATA: Menorrhagia, enlarged uterus, history of fibroids



[Series 1: us pelvis complete · 0.27mm/px · 14 of 42 slices shown]
[im 1/42]
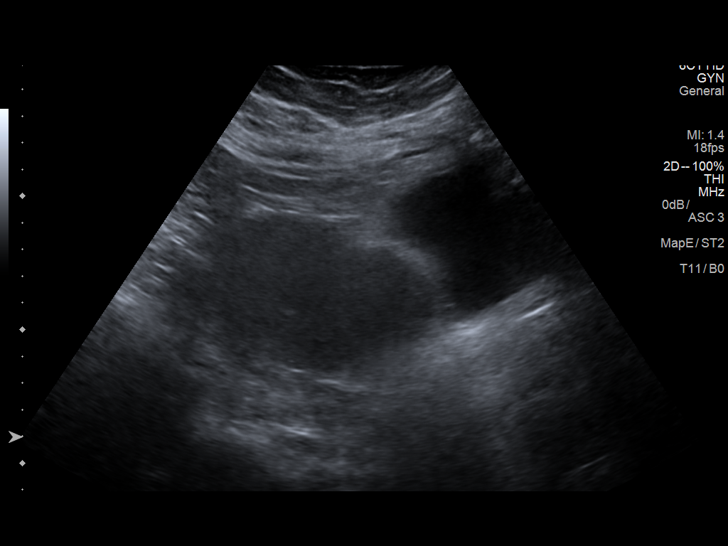
[im 4/42]
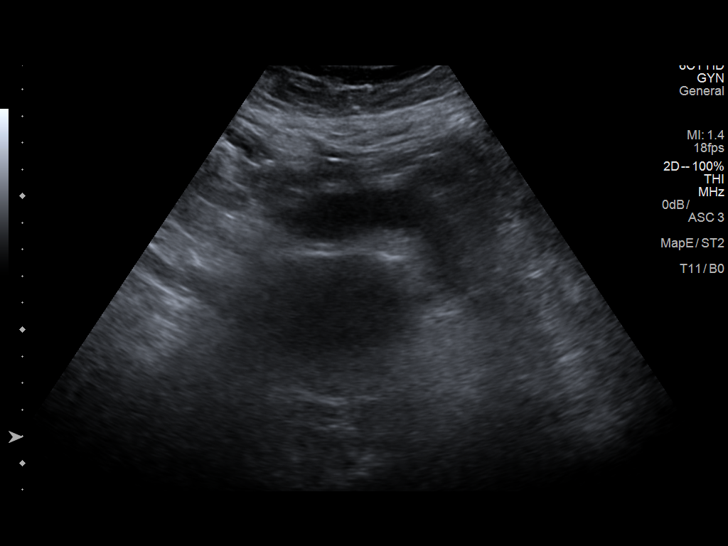
[im 7/42]
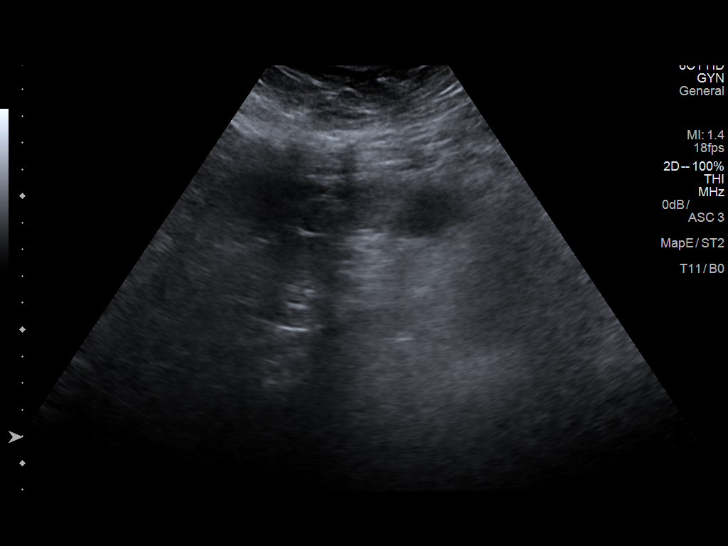
[im 11/42]
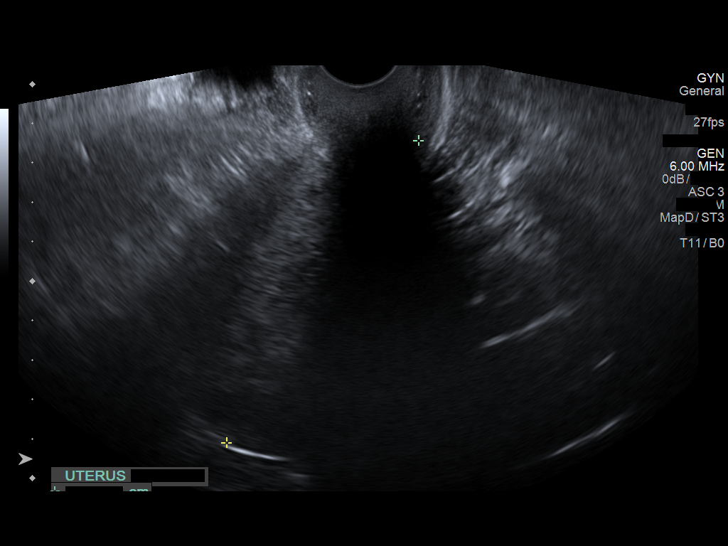
[im 14/42]
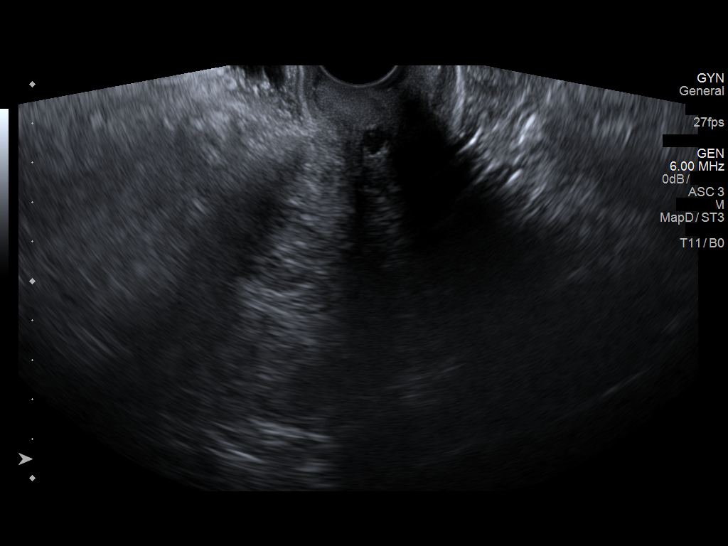
[im 16/42]
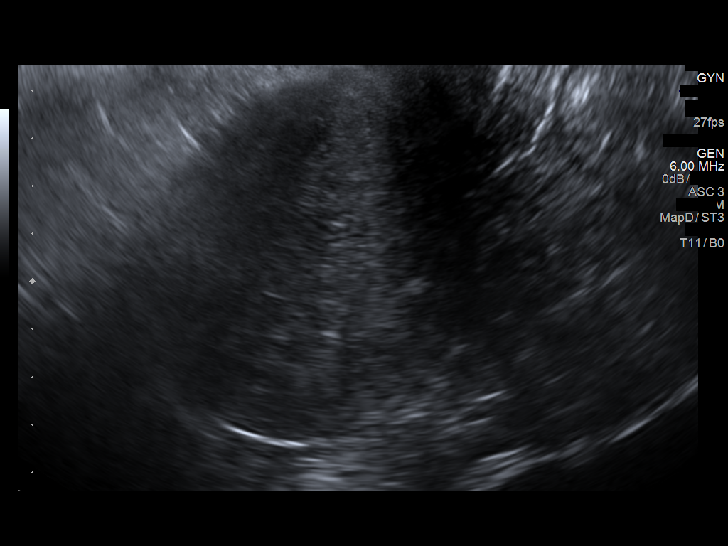
[im 19/42]
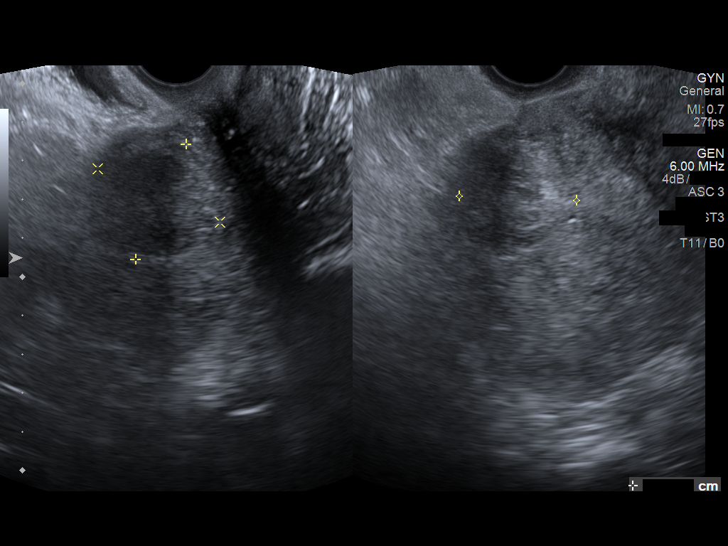
[im 23/42]
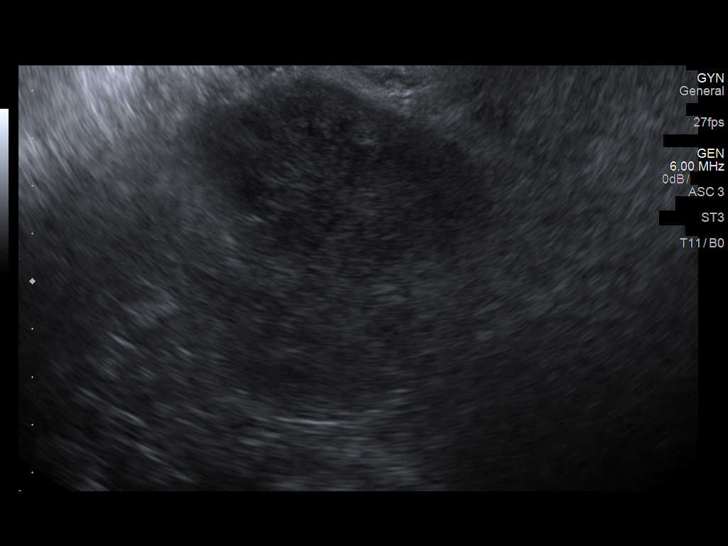
[im 26/42]
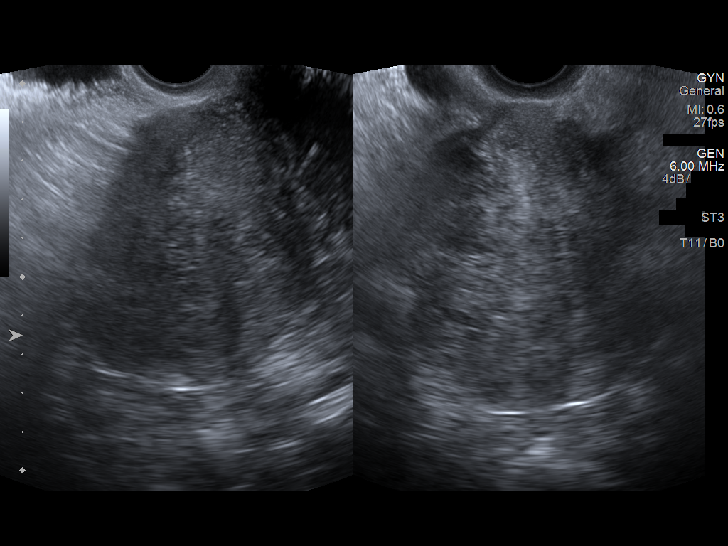
[im 28/42]
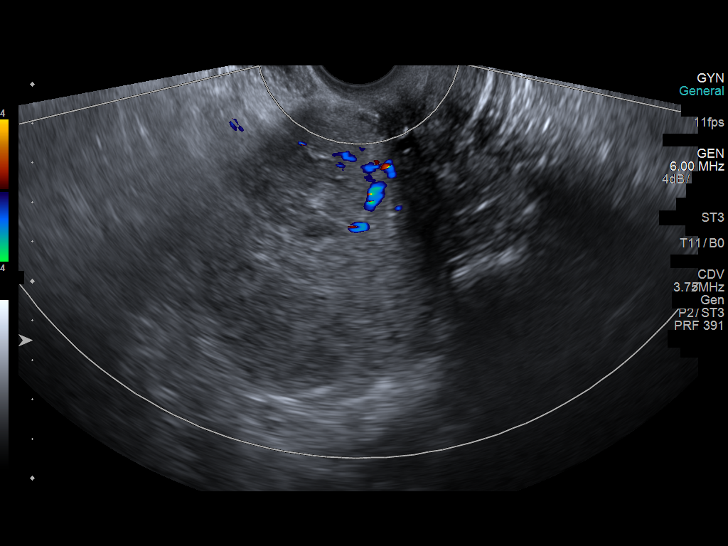
[im 31/42]
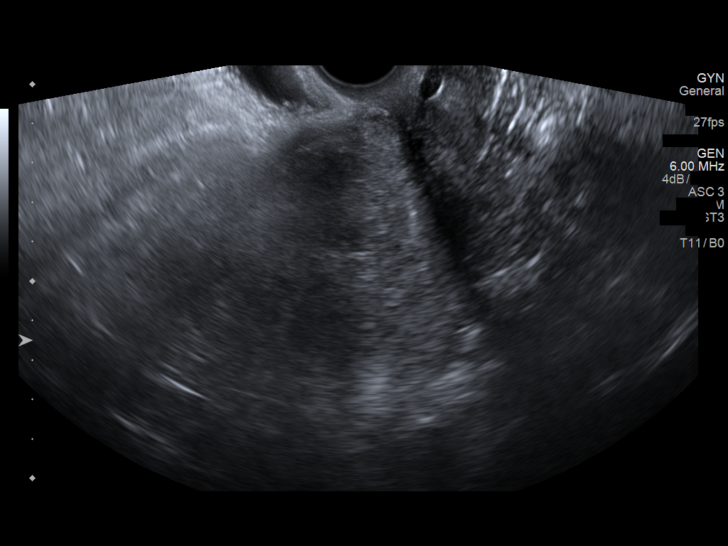
[im 35/42]
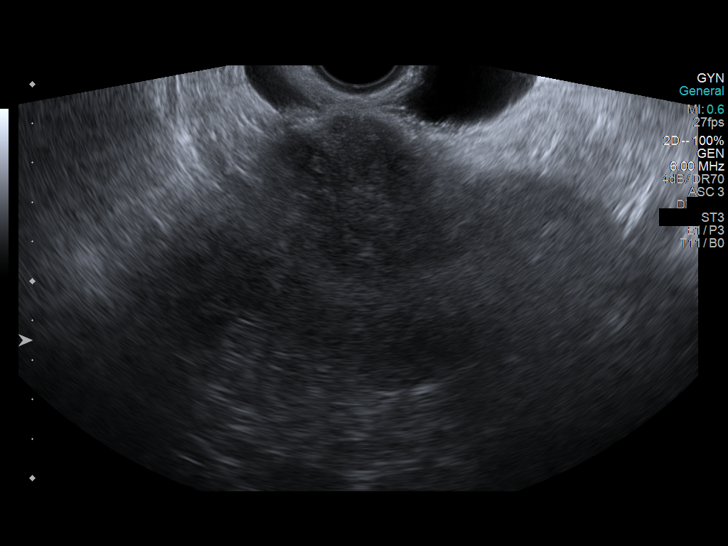
[im 38/42]
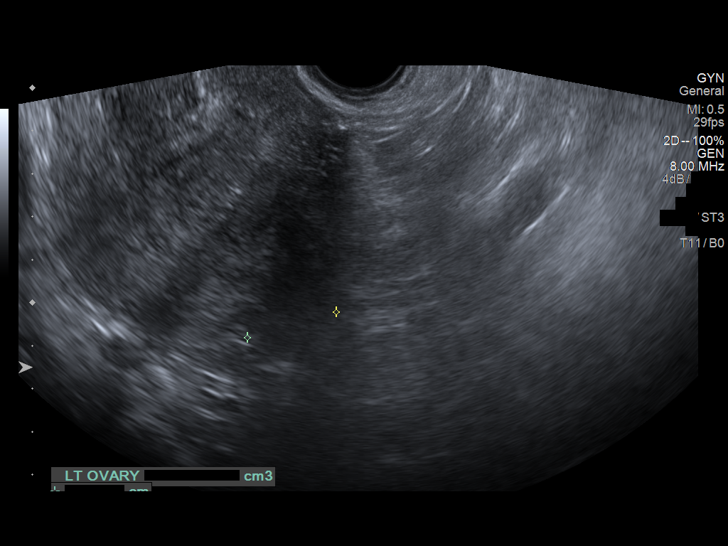
[im 42/42]
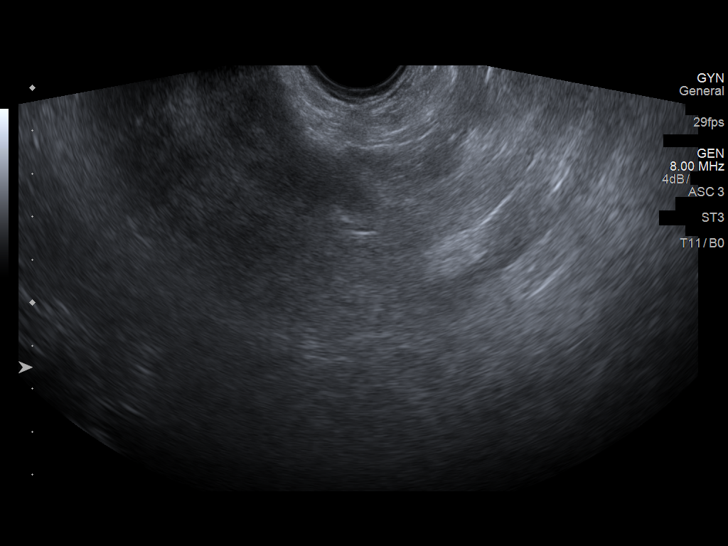

[14 of 25 positions shown; findings below may reference images not displayed]

FINDINGS: Uterus

Measurements: 9.1 x 5.9 x 6.8 cm. 3.3 x 3.4 x 3.0 cm subserosal
fibroid in the left anterior lower uterine body. 3.7 x 2.6 x 3.1 cm
subserosal fibroid in the anterior uterine fundus.

Endometrium

Poorly visualized.

Right ovary

Measurements: 3.2 x 2.6 x 2.4 cm.  Poorly visualized.

Left ovary

Measurements: 3.5 x 1.7 x 2.2 cm.  Poorly visualized.

Other findings

No abnormal free fluid.
IMPRESSION: Two dominant uterine fibroids, measuring up to 3.7 cm in anterior
uterine fundus.

Endometrium is poorly visualized.

Bilateral ovaries are poorly visualized but grossly unremarkable.

## 2019-03-25 ENCOUNTER — Other Ambulatory Visit: Payer: Self-pay

## 2019-03-25 DIAGNOSIS — N921 Excessive and frequent menstruation with irregular cycle: Secondary | ICD-10-CM

## 2019-03-25 DIAGNOSIS — N852 Hypertrophy of uterus: Secondary | ICD-10-CM

## 2019-03-25 MED ORDER — MEGESTROL ACETATE 40 MG PO TABS: 40.0000 mg | ORAL_TABLET | Freq: Two times a day (BID) | ORAL | 0 refills | Status: DC

## 2019-03-25 NOTE — Telephone Encounter (Signed)
refil received via fax

## 2023-04-29 ENCOUNTER — Encounter (INDEPENDENT_AMBULATORY_CARE_PROVIDER_SITE_OTHER): Payer: Self-pay

## 2024-06-10 NOTE — Progress Notes (Signed)
 PATIENT: Vanessa Barajas MRN:  [id number] DOB:  [birth year] DATE OF SERVICE:  06/10/2024  Referring Physician:  Darra Vanessa RAMAN, MD Primary Physician:  Vanessa Barajas Darra, MD  Chief Complaint:  Chief Complaint  Patient presents with  . Follow-up    Primary osteoarthritis of left knee     SUBJECTIVE: Vanessa Barajas is a 53 y.o. female who presents today for left knee Synvisc 1 injection.  Current Medications[1] Past Medical History:  Diagnosis Date  . Anxiety   . Back pain   . Depression   . Diabetes mellitus (*)    NO MEDICATIONS   . GERD (gastroesophageal reflux disease)   . History of transfusion   . Hypertension   . OSA (obstructive sleep apnea)    mild does not need CPAP  . Rupture of right Achilles tendon    Past Surgical History:  Procedure Laterality Date  . Colonoscopy w/ polypectomy  04/17/2023   Poor prep  . Ectopic pregnancy surgery     X2   . Hernia repair    . Hernia repair    . Tonsillectomy    . Tubal ligation     Social History   Social History Narrative  . Not on file   Allergies[2]  Review of Systems:See below for the nursing review of systems which I have reviewed, edited and corrected.  Review of Systems  Musculoskeletal:  Positive for joint pain.  All other systems reviewed and are negative.    VITALS: LMP 05/10/2017   PHYSICAL EXAM:  The patient is a well nourished, well developed in no acute distress. Alert, oriented/interactive. Breathing is non labored.  Examination of the involved extremity reveals the skin to be intact and there is no adenopathy.  Examination of her left knee demonstrates no deformity, no soft tissue swelling and no appreciable joint effusion.  No surrounding erythema or warmth.  Compartments remain soft compressible and nontender and she is grossly neurovascular intact distally.  XRAYS obtained in the office today and personally interpreted by me include none.    ASSESSMENT:  1. Primary osteoarthritis of  left knee  hylan (SYNVISC ONE) injection 48 mg       PLAN: I discussed the condition, exam and imaging studies with the patient/parents/family. I discussed treatment options. Left knee Synvisc 1 injection as noted.  She will call for follow-up as needed.  We discussed that this injection can take 4 to 6 weeks for maximum improvement.    Large joint injection/arthrocentesis: L knee Date/Time: 06/10/2024 11:13 AM Consent given by: patient Site marked: site marked Timeout: Immediately prior to procedure a time out was called to verify the correct patient, procedure, equipment, support staff and site/side marked as required  Supporting Documentation Indications: pain  Procedure Details Location: knee - L knee Preparation: Patient was prepped and draped in the usual sterile fashion Needle gauge: 21 G. Approach: superolateral Medication group details: medication documented in MAR. Patient tolerance: patient tolerated the procedure well with no immediate complications       This note was dictated with voice recognition software. Similar sounding words can inadvertently be transcribed and not corrected upon review.    Author:  Asberry JONETTA Peach, PA-C 06/10/2024 11:28 AM         [1] Current Outpatient Medications  Medication Sig Dispense Refill  . albuterol sulfate HFA (PROVENTIL,VENTOLIN,PROAIR) 108 (90 Base) MCG/ACT inhaler Inhale one puff to two puffs into the lungs every 6 (six) hours as needed for Wheezing or Shortness of  Breath. 18 g 0  . amLODIPine  besylate (NORVASC ) 10 mg tablet TAKE ONE TABLET BY MOUTH DAILY. 90 tablet 3  . ARIPiprazole  (ABILIFY ) 10 mg tablet TAKE 1 TABLET IN THE MORNING AND 1/2 TABLET IN THE EVENING 45 tablet 11  . BD PEN NEEDLE SHORT U/F 31G X 8 MM MISC Inject one each into the skin 3 (three) times a day.    . clonazePAM  (KLONOPIN ) 1 mg tablet TAKE ONE TABLET (1 MG DOSE) BY MOUTH 3 (THREE) TIMES A DAY AS NEEDED. 90 tablet 0  . Continuous Glucose Sensor  (DEXCOM G7 SENSOR) MISC     . empagliflozin  (JARDIANCE ) 25 mg TABS tablet Take one tablet (25 mg dose) by mouth daily. 30 tablet 11  . insulin  glargine (LANTUS) 100 Unit/mL injection Inject thirty Units into the skin daily. 10 mL 5  . Insulin  Regular Human (NOVOLIN R FLEXPEN) 100 UNIT/ML SOPN INJECT 23 UNITS 30 MINUTES BEFORE MEALS AS DIRECTED    . metFORMIN ER (GLUCOPHAGE-XR) 500 mg 24 hr tablet TAKE TWO TABLETS (1,000 MG DOSE) BY MOUTH 2 (TWO) TIMES DAILY. 360 tablet 1  . nystatin (MYCOSTATIN) ointment Apply topically 2 (two) times daily. 30 g 2  . pantoprazole  sodium (PROTONIX ) 40 mg tablet TAKE ONE TABLET BY MOUTH DAILY. 90 tablet 3  . promethazine (PHENERGAN) 25 MG tablet Take one half tablet (12.5 mg dose) by mouth every 8 (eight) hours as needed for Nausea for up to 10 doses. 5 tablet 0  . sertraline  (ZOLOFT ) 100 mg tablet Take two tablets (200 mg dose) by mouth every morning. 180 tablet 3   Current Facility-Administered Medications  Medication Dose Route Frequency Provider Last Rate Last Admin  . lidocaine  (XYLOCAINE ) 1% injection 4 mL  4 mL  Once PRN    4 mL at 01/19/24 1441  . lidocaine  (XYLOCAINE ) 1% injection 4 mL  4 mL  Once PRN    4 mL at 01/19/24 1441  . triamcinolone acetonide (KENALOG-40) 40 mg/mL injection 40 mg  40 mg Intra-articular Once PRN    40 mg at 10/19/23 0914  . triamcinolone acetonide (KENALOG-40) 40 mg/mL injection 40 mg  40 mg Intra-articular Once PRN    40 mg at 10/19/23 0916  . triamcinolone acetonide (KENALOG-40) 40 mg/mL injection 40 mg  40 mg Intra-articular Once PRN    40 mg at 01/19/24 1441  . triamcinolone acetonide (KENALOG-40) 40 mg/mL injection 40 mg  40 mg Intra-articular Once PRN    40 mg at 01/19/24 1441  [2] Allergies Allergen Reactions  . Lisinopril Swelling  . Losartan Swelling

## 2024-06-27 NOTE — Progress Notes (Signed)
 Encounter Date: 06/23/2024   ASSESSMENT:   1. Knee pain, unspecified chronicity, unspecified laterality   2. Insomnia, unspecified type   3. Primary hypertension   ::::::::::::::::: SUBJECTIVE HPI:   HPI: Vanessa Barajas is a 53 y.o. female presenting for symptom and medication management through a telemedicine visit from their home in [place]   Past medical and surgical history, family and social history, allergies, medications, and problem list were reviewed and updated as appropriate in the EMR as part of the visit.   We discussed the increasing knee pain that she has described over the past 1 to 2 months.  She has a component of osteoarthritis and is trying to implement measures to improve this as best she can.  We discussed treatment options.  We discussed her hypertension and insomnia, with additional treatments recommended at this time.  PLAN:   Knee pain.  Consistent with osteoarthritis.  Weight loss and exercise encouraged to improve this, with exercise/walking regimen to improve knee strength and function.  We discussed that weight also plays a role in regard to this and she is trying to implement changes and focus there.  Diclofenac acutely for relief of symptoms.  May continue the tramadol  for associated pain.  Insomnia.  Zolpidem , as prescribed.  Will follow-up in the office as scheduled for further discussion of her response and consider further workup of the knee if not improving with additional imaging and consideration of orthopedic care.  She has discussed and could be a candidate for TKR, but her BMI has to be improved given the high risk in her group.  Examination conducted with the use of video cameras/computer monitors/smartphone. Vital signs and other aspects of physical exam are limited due to the nature of this encounter.   Further Discussion: Typical risk, benefits and side effects of medications and the treatment plan were discussed and questions concerning those  were addressed. She will let us  know if she experiences any untoward side effects from the medications.   Follow Up: Follow up as needed, contacting the office if not improving with recommended therapies or if any other questions arise  This note was transcribed with voice recognition software.  Please excuse transcription errors.  OBJECTIVE:  Constitutional:  Alert and oriented with normal mentation and verbally interactive. Respiratory:  Unlabored breathing Mood:  Appears appropriate to situation.   ROS: With the exception of the pertinent positive and negative findings noted in the HPI, a complete review of systems was reviewed and negative. LMP 05/10/2017   Tobacco: Patient  reports that she quit smoking about 6 years ago. Her smoking use included cigarettes. She started smoking about 21 years ago. She has a 3.8 pack-year smoking history. She has quit using smokeless tobacco.  Allergies: Allergies[1]   ORDERS:   Requested Prescriptions   Signed Prescriptions Disp Refills  . clonazePAM  (KLONOPIN ) 1 mg tablet 90 tablet 0    Sig: Take one tablet (1 mg dose) by mouth mealtime.  . traMADol  (ULTRAM ) 50 mg tablet 60 tablet 0    Sig: Take one tablet (50 mg dose) by mouth every 8 (eight) hours as needed for Pain for up to 30 days. Max Daily Amount: 150 mg  . zolpidem  tartrate (AMBIEN ) 10 mg tablet 30 tablet 0    Sig: Take one tablet (10 mg dose) by mouth at bedtime as needed for Sleep for up to 30 days. Take one tablet by mouth at bedtime as needed for sleep. Max Daily Amount: 10 mg  . diclofenac sodium (  VOLTAREN) 75 mg EC tablet 60 tablet 1    Sig: Take one tablet (75 mg dose) by mouth 2 (two) times daily.    Medications Discontinued During This Encounter  Medication Reason  . clonazePAM  (KLONOPIN ) 1 mg tablet Reorder    No orders of the defined types were placed in this encounter.    UPDATED, REVIEWED, AND CONSIDERED TODAY:  Medications Taking[2]  Tobacco: Patient  reports  that she quit smoking about 6 years ago. Her smoking use included cigarettes. She started smoking about 21 years ago. She has a 3.8 pack-year smoking history. She has quit using smokeless tobacco.  Allergies: Allergies[3]  Problem List[4] Past Medical History:  Diagnosis Date  . Anxiety   . Back pain   . Depression   . Diabetes mellitus (*)    NO MEDICATIONS   . GERD (gastroesophageal reflux disease)   . History of transfusion   . Hypertension   . OSA (obstructive sleep apnea)    mild does not need CPAP  . Rupture of right Achilles tendon    Past Surgical History:  Procedure Laterality Date  . Colonoscopy w/ polypectomy  04/17/2023   Poor prep  . Ectopic pregnancy surgery     X2   . Hernia repair    . Hernia repair    . Tonsillectomy    . Tubal ligation     Family History  Problem Relation Age of Onset  . Hypertension Mother   . Cancer Mother   . No Known Problems Father   . Cancer Sister   . Cancer Maternal Grandmother   . No Known Problems Maternal Grandfather   . No Known Problems Paternal Grandmother   . No Known Problems Paternal Grandfather   . Breast cancer Neg Hx   . Ovarian cancer Neg Hx    Health Maintenance  Topic Date Due  . Diabetes Eye Exam  01/02/2023  . Urine Drug Testing  04/01/2023  . COVID-19 Vaccine (4 - 2024-25 season) 07/12/2023  . Mammogram  03/03/2024  . Colorectal Cancer Screening  04/16/2024  . Adult Wellness Exam  06/24/2024  . Diabetes Hemoglobin A1C  08/18/2024  . HgbA1C  08/18/2024  . Diabetes Foot Exam  11/04/2024  . Diabetes Lipid Profile  11/18/2024  . Diabetes GFR/EGFR  02/16/2025  . ALT Level  02/16/2025  . Creatinine Level  02/16/2025  . Potassium Level  02/16/2025  . AST Level  02/16/2025  . Hemoglobin  02/16/2025  . Diabetes Annual Microalbumin/Creatinine Ratio  02/16/2025  . DTaP/Tdap/Td Vaccines (2 - Td or Tdap) 09/02/2025  . Pap Smear  11/09/2026  . Zoster Vaccine  Completed  . Pneumococcal Vaccine: 50+ Years   Completed  . Meningococcal B Vaccine  Aged Out  . Hepatitis A Vaccine  Aged Out  . Meningococcal Conjugate Vaccine  Aged Out    End of Note          [1] Allergies Allergen Reactions  . Lisinopril Swelling  . Losartan Swelling  [2] Outpatient Medications Marked as Taking for the 06/23/24 encounter (Telemedicine) with Fonda GORMAN Law, MD  Medication Sig Dispense Refill  . albuterol sulfate HFA (PROVENTIL,VENTOLIN,PROAIR) 108 (90 Base) MCG/ACT inhaler Inhale one puff to two puffs into the lungs every 6 (six) hours as needed for Wheezing or Shortness of Breath. 18 g 0  . amLODIPine  besylate (NORVASC ) 10 mg tablet TAKE ONE TABLET BY MOUTH DAILY. 90 tablet 3  . ARIPiprazole  (ABILIFY ) 10 mg tablet TAKE 1 TABLET IN THE MORNING  AND 1/2 TABLET IN THE EVENING 45 tablet 11  . [DISCONTINUED] clonazePAM  (KLONOPIN ) 1 mg tablet TAKE ONE TABLET (1 MG DOSE) BY MOUTH 3 (THREE) TIMES A DAY AS NEEDED. 90 tablet 0  . empagliflozin  (JARDIANCE ) 25 mg TABS tablet Take one tablet (25 mg dose) by mouth daily. 30 tablet 11  . insulin  glargine (LANTUS) 100 Unit/mL injection Inject thirty Units into the skin daily. 10 mL 5  . Insulin  Regular Human (NOVOLIN R FLEXPEN) 100 UNIT/ML SOPN INJECT 23 UNITS 30 MINUTES BEFORE MEALS AS DIRECTED    . nystatin (MYCOSTATIN) ointment Apply topically 2 (two) times daily. 30 g 2  . pantoprazole  sodium (PROTONIX ) 40 mg tablet TAKE ONE TABLET BY MOUTH DAILY. 90 tablet 3  . promethazine (PHENERGAN) 25 MG tablet Take one half tablet (12.5 mg dose) by mouth every 8 (eight) hours as needed for Nausea for up to 10 doses. 5 tablet 0  . sertraline  (ZOLOFT ) 100 mg tablet Take two tablets (200 mg dose) by mouth every morning. 180 tablet 3   Current Facility-Administered Medications for the 06/23/24 encounter (Telemedicine) with Fonda GORMAN Law, MD  Medication Dose Route Frequency Provider Last Rate Last Admin  . lidocaine  (XYLOCAINE ) 1% injection 4 mL  4 mL  Once PRN    4 mL at 01/19/24 1441   . lidocaine  (XYLOCAINE ) 1% injection 4 mL  4 mL  Once PRN    4 mL at 01/19/24 1441  . triamcinolone acetonide (KENALOG-40) 40 mg/mL injection 40 mg  40 mg Intra-articular Once PRN    40 mg at 10/19/23 0914  . triamcinolone acetonide (KENALOG-40) 40 mg/mL injection 40 mg  40 mg Intra-articular Once PRN    40 mg at 10/19/23 0916  . triamcinolone acetonide (KENALOG-40) 40 mg/mL injection 40 mg  40 mg Intra-articular Once PRN    40 mg at 01/19/24 1441  . triamcinolone acetonide (KENALOG-40) 40 mg/mL injection 40 mg  40 mg Intra-articular Once PRN    40 mg at 01/19/24 1441  [3] Allergies Allergen Reactions  . Lisinopril Swelling  . Losartan Swelling  [4] Patient Active Problem List Diagnosis  . Poorly controlled type 2 diabetes mellitus (*)  . GERD (gastroesophageal reflux disease)  . Hypertension  . Mixed anxiety and depressive disorder  . Ventral hernia without obstruction or gangrene  . Chronic constipation  . Status post Achilles tendon repair, RIGHT --10/21/2017  . Class 3 severe obesity due to excess calories with serious comorbidity and body mass index (BMI) of 40.0 to 44.9 in adult  . OSA (obstructive sleep apnea)  . MDD (major depressive disorder), recurrent severe, without psychosis (*)  . GAD (generalized anxiety disorder)  . Tobacco use disorder, mild  . Hyperglycemia due to type 2 diabetes mellitus (*)  . Depression with suicidal ideation  . MDD (major depressive disorder), severe (*)  . DNR (do not resuscitate)

## 2024-07-09 ENCOUNTER — Inpatient Hospital Stay (HOSPITAL_COMMUNITY)
Admission: EM | Admit: 2024-07-09 | Discharge: 2024-07-10 | DRG: 918 | Disposition: A | Discharge status: Other Acute Inpatient Hospital | Attending: Pulmonary Disease | Admitting: Pulmonary Disease

## 2024-07-09 ENCOUNTER — Other Ambulatory Visit: Payer: Self-pay

## 2024-07-09 ENCOUNTER — Encounter (HOSPITAL_COMMUNITY): Payer: Self-pay

## 2024-07-09 DIAGNOSIS — R4589 Other symptoms and signs involving emotional state: Secondary | ICD-10-CM | POA: Diagnosis present

## 2024-07-09 DIAGNOSIS — E872 Acidosis, unspecified: Secondary | ICD-10-CM | POA: Diagnosis present

## 2024-07-09 DIAGNOSIS — E162 Hypoglycemia, unspecified: Secondary | ICD-10-CM

## 2024-07-09 DIAGNOSIS — F332 Major depressive disorder, recurrent severe without psychotic features: Secondary | ICD-10-CM | POA: Diagnosis present

## 2024-07-09 DIAGNOSIS — F17290 Nicotine dependence, other tobacco product, uncomplicated: Secondary | ICD-10-CM | POA: Diagnosis present

## 2024-07-09 DIAGNOSIS — Z79899 Other long term (current) drug therapy: Secondary | ICD-10-CM | POA: Diagnosis not present

## 2024-07-09 DIAGNOSIS — Z5986 Financial insecurity: Secondary | ICD-10-CM | POA: Diagnosis not present

## 2024-07-09 DIAGNOSIS — T383X2A Poisoning by insulin and oral hypoglycemic [antidiabetic] drugs, intentional self-harm, initial encounter: Secondary | ICD-10-CM | POA: Diagnosis present

## 2024-07-09 DIAGNOSIS — E66813 Obesity, class 3: Secondary | ICD-10-CM | POA: Diagnosis present

## 2024-07-09 DIAGNOSIS — Z9152 Personal history of nonsuicidal self-harm: Secondary | ICD-10-CM

## 2024-07-09 DIAGNOSIS — I10 Essential (primary) hypertension: Secondary | ICD-10-CM | POA: Diagnosis present

## 2024-07-09 DIAGNOSIS — E11649 Type 2 diabetes mellitus with hypoglycemia without coma: Secondary | ICD-10-CM | POA: Diagnosis present

## 2024-07-09 DIAGNOSIS — T50902A Poisoning by unspecified drugs, medicaments and biological substances, intentional self-harm, initial encounter: Principal | ICD-10-CM

## 2024-07-09 DIAGNOSIS — L299 Pruritus, unspecified: Secondary | ICD-10-CM | POA: Diagnosis present

## 2024-07-09 DIAGNOSIS — Z888 Allergy status to other drugs, medicaments and biological substances status: Secondary | ICD-10-CM

## 2024-07-09 DIAGNOSIS — Z8249 Family history of ischemic heart disease and other diseases of the circulatory system: Secondary | ICD-10-CM | POA: Diagnosis not present

## 2024-07-09 DIAGNOSIS — F419 Anxiety disorder, unspecified: Secondary | ICD-10-CM | POA: Diagnosis present

## 2024-07-09 DIAGNOSIS — Z794 Long term (current) use of insulin: Secondary | ICD-10-CM

## 2024-07-09 DIAGNOSIS — Z806 Family history of leukemia: Secondary | ICD-10-CM

## 2024-07-09 DIAGNOSIS — E876 Hypokalemia: Secondary | ICD-10-CM | POA: Diagnosis not present

## 2024-07-09 DIAGNOSIS — G4700 Insomnia, unspecified: Secondary | ICD-10-CM | POA: Diagnosis present

## 2024-07-09 DIAGNOSIS — Z6841 Body Mass Index (BMI) 40.0 and over, adult: Secondary | ICD-10-CM

## 2024-07-09 DIAGNOSIS — Z91148 Patient's other noncompliance with medication regimen for other reason: Secondary | ICD-10-CM | POA: Diagnosis not present

## 2024-07-09 DIAGNOSIS — T1491XA Suicide attempt, initial encounter: Secondary | ICD-10-CM | POA: Diagnosis present

## 2024-07-09 LAB — COMPREHENSIVE METABOLIC PANEL WITH GFR
ALT: 20 U/L (ref 0–44)
ALT: 19 U/L (ref 0–44)
ALT: 15 U/L (ref 0–44)
AST: 25 U/L (ref 15–41)
AST: 22 U/L (ref 15–41)
AST: 14 U/L — ABNORMAL LOW (ref 15–41)
Albumin: 3.9 g/dL (ref 3.5–5.0)
Albumin: 3.7 g/dL (ref 3.5–5.0)
Albumin: 3.5 g/dL (ref 3.5–5.0)
Alkaline Phosphatase: 126 U/L (ref 38–126)
Alkaline Phosphatase: 111 U/L (ref 38–126)
Alkaline Phosphatase: 98 U/L (ref 38–126)
Anion gap: 16 — ABNORMAL HIGH (ref 5–15)
Anion gap: 13 (ref 5–15)
Anion gap: 10 (ref 5–15)
BUN: 13 mg/dL (ref 6–20)
BUN: 13 mg/dL (ref 6–20)
BUN: 12 mg/dL (ref 6–20)
CO2: 21 mmol/L — ABNORMAL LOW (ref 22–32)
CO2: 22 mmol/L (ref 22–32)
CO2: 21 mmol/L — ABNORMAL LOW (ref 22–32)
Calcium: 9.4 mg/dL (ref 8.9–10.3)
Calcium: 9 mg/dL (ref 8.9–10.3)
Calcium: 8.4 mg/dL — ABNORMAL LOW (ref 8.9–10.3)
Chloride: 107 mmol/L (ref 98–111)
Chloride: 106 mmol/L (ref 98–111)
Chloride: 104 mmol/L (ref 98–111)
Creatinine, Ser: 0.75 mg/dL (ref 0.44–1.00)
Creatinine, Ser: 0.65 mg/dL (ref 0.44–1.00)
Creatinine, Ser: 0.8 mg/dL (ref 0.44–1.00)
GFR, Estimated: >60 mL/min (ref >60)
GFR, Estimated: >60 mL/min (ref >60)
GFR, Estimated: >60 mL/min (ref >60)
Glucose, Bld: 60 mg/dL — ABNORMAL LOW (ref 70–99)
Glucose, Bld: 87 mg/dL (ref 70–99)
Glucose, Bld: 347 mg/dL — ABNORMAL HIGH (ref 70–99)
Potassium: 2.5 mmol/L — CL (ref 3.5–5.1)
Potassium: 3.6 mmol/L (ref 3.5–5.1)
Potassium: 3.3 mmol/L — ABNORMAL LOW (ref 3.5–5.1)
Sodium: 144 mmol/L (ref 135–145)
Sodium: 140 mmol/L (ref 135–145)
Sodium: 135 mmol/L (ref 135–145)
Total Bilirubin: 0.3 mg/dL (ref 0.0–1.2)
Total Bilirubin: 0.3 mg/dL (ref 0.0–1.2)
Total Bilirubin: 0.4 mg/dL (ref 0.0–1.2)
Total Protein: 6.7 g/dL (ref 6.5–8.1)
Total Protein: 6.3 g/dL — ABNORMAL LOW (ref 6.5–8.1)
Total Protein: 5.8 g/dL — ABNORMAL LOW (ref 6.5–8.1)

## 2024-07-09 LAB — GLUCOSE, CAPILLARY
Glucose-Capillary: 359 mg/dL — ABNORMAL HIGH (ref 70–99)
Glucose-Capillary: 323 mg/dL — ABNORMAL HIGH (ref 70–99)
Glucose-Capillary: 335 mg/dL — ABNORMAL HIGH (ref 70–99)
Glucose-Capillary: 317 mg/dL — ABNORMAL HIGH (ref 70–99)
Glucose-Capillary: 287 mg/dL — ABNORMAL HIGH (ref 70–99)

## 2024-07-09 LAB — CBC
HCT: 39 % (ref 36.0–46.0)
Hemoglobin: 13 g/dL (ref 12.0–15.0)
MCH: 29 pg (ref 26.0–34.0)
MCHC: 33.3 g/dL (ref 30.0–36.0)
MCV: 87.1 fL (ref 80.0–100.0)
Platelets: 218 K/uL (ref 150–400)
RBC: 4.48 MIL/uL (ref 3.87–5.11)
RDW: 13.4 % (ref 11.5–15.5)
WBC: 11.4 K/uL — ABNORMAL HIGH (ref 4.0–10.5)
nRBC: 0 % (ref 0.0–0.2)

## 2024-07-09 LAB — ETHANOL: Alcohol, Ethyl (B): <15 mg/dL (ref <15)

## 2024-07-09 LAB — PHOSPHORUS: Phosphorus: 2 mg/dL — ABNORMAL LOW (ref 2.5–4.6)

## 2024-07-09 LAB — CBG MONITORING, ED
Glucose-Capillary: 55 mg/dL — ABNORMAL LOW (ref 70–99)
Glucose-Capillary: 73 mg/dL (ref 70–99)
Glucose-Capillary: 87 mg/dL (ref 70–99)
Glucose-Capillary: 68 mg/dL — ABNORMAL LOW (ref 70–99)
Glucose-Capillary: 119 mg/dL — ABNORMAL HIGH (ref 70–99)
Glucose-Capillary: 189 mg/dL — ABNORMAL HIGH (ref 70–99)
Glucose-Capillary: 312 mg/dL — ABNORMAL HIGH (ref 70–99)

## 2024-07-09 LAB — CBC WITH DIFFERENTIAL/PLATELET
Abs Immature Granulocytes: 0.08 K/uL — ABNORMAL HIGH (ref 0.00–0.07)
Basophils Absolute: 0.1 K/uL (ref 0.0–0.1)
Basophils Relative: 1 %
Eosinophils Absolute: 0.2 K/uL (ref 0.0–0.5)
Eosinophils Relative: 2 %
HCT: 40.2 % (ref 36.0–46.0)
Hemoglobin: 13.8 g/dL (ref 12.0–15.0)
Immature Granulocytes: 1 %
Lymphocytes Relative: 22 %
Lymphs Abs: 2.2 K/uL (ref 0.7–4.0)
MCH: 29.5 pg (ref 26.0–34.0)
MCHC: 34.3 g/dL (ref 30.0–36.0)
MCV: 85.9 fL (ref 80.0–100.0)
Monocytes Absolute: 0.9 K/uL (ref 0.1–1.0)
Monocytes Relative: 9 %
Neutro Abs: 6.9 K/uL (ref 1.7–7.7)
Neutrophils Relative %: 65 %
Platelets: 253 K/uL (ref 150–400)
RBC: 4.68 MIL/uL (ref 3.87–5.11)
RDW: 13.5 % (ref 11.5–15.5)
WBC: 10.3 K/uL (ref 4.0–10.5)
nRBC: 0 % (ref 0.0–0.2)

## 2024-07-09 LAB — I-STAT CG4 LACTIC ACID, ED: Lactic Acid, Venous: 2.4 mmol/L (ref 0.5–1.9)

## 2024-07-09 LAB — BLOOD GAS, VENOUS
Acid-Base Excess: 2.6 mmol/L — ABNORMAL HIGH (ref 0.0–2.0)
Bicarbonate: 27.2 mmol/L (ref 20.0–28.0)
O2 Saturation: 76.4 %
Patient temperature: 37
pCO2, Ven: 41 mmHg — ABNORMAL LOW (ref 44–60)
pH, Ven: 7.43 (ref 7.25–7.43)
pO2, Ven: 43 mmHg (ref 32–45)

## 2024-07-09 LAB — SALICYLATE LEVEL: Salicylate Lvl: <7 mg/dL — ABNORMAL LOW (ref 7.0–30.0)

## 2024-07-09 LAB — URINE DRUG SCREEN
Amphetamines: NEGATIVE
Barbiturates: NEGATIVE
Benzodiazepines: POSITIVE — AB
Cocaine: NEGATIVE
Fentanyl: NEGATIVE
Methadone Scn, Ur: NEGATIVE
Opiates: NEGATIVE
Tetrahydrocannabinol: NEGATIVE

## 2024-07-09 LAB — MRSA NEXT GEN BY PCR, NASAL: MRSA by PCR Next Gen: NOT DETECTED

## 2024-07-09 LAB — MAGNESIUM
Magnesium: 2.2 mg/dL (ref 1.7–2.4)
Magnesium: 2.1 mg/dL (ref 1.7–2.4)

## 2024-07-09 LAB — ACETAMINOPHEN LEVEL: Acetaminophen (Tylenol), Serum: <10 ug/mL — ABNORMAL LOW (ref 10–30)

## 2024-07-09 LAB — HIV ANTIBODY (ROUTINE TESTING W REFLEX): HIV Screen 4th Generation wRfx: NONREACTIVE

## 2024-07-09 MED ORDER — SERTRALINE HCL 100 MG PO TABS
200.0000 mg | ORAL_TABLET | Freq: Every day | ORAL | Status: DC
  Administered 2024-07-09 – 2024-07-10 (×2): 200 mg via ORAL
  Filled 2024-07-09: qty 4
  Filled 2024-07-09: qty 2

## 2024-07-09 MED ORDER — ONDANSETRON HCL 4 MG/2ML IJ SOLN
4.0000 mg | Freq: Four times a day (QID) | INTRAMUSCULAR | Status: DC | PRN
  Administered 2024-07-09 – 2024-07-10 (×2): 4 mg via INTRAVENOUS
  Filled 2024-07-09 (×4): qty 2

## 2024-07-09 MED ORDER — CALAMINE EX LOTN
1.0000 | TOPICAL_LOTION | CUTANEOUS | Status: DC | PRN
  Filled 2024-07-09: qty 177

## 2024-07-09 MED ORDER — SODIUM CHLORIDE 0.9 % IV SOLN: INTRAVENOUS | Status: AC

## 2024-07-09 MED ORDER — CLONAZEPAM 0.5 MG PO TABS
3.0000 mg | ORAL_TABLET | Freq: Every day | ORAL | Status: DC
  Administered 2024-07-09: 3 mg via ORAL
  Filled 2024-07-09: qty 6

## 2024-07-09 MED ORDER — DEXAMETHASONE SODIUM PHOSPHATE 4 MG/ML IJ SOLN
4.0000 mg | Freq: Once | INTRAMUSCULAR | Status: AC
  Administered 2024-07-09: 4 mg via INTRAVENOUS
  Filled 2024-07-09: qty 1

## 2024-07-09 MED ORDER — CHLORHEXIDINE GLUCONATE CLOTH 2 % EX PADS
6.0000 | MEDICATED_PAD | Freq: Every day | CUTANEOUS | Status: DC
  Administered 2024-07-09: 6 via TOPICAL

## 2024-07-09 MED ORDER — MAGNESIUM SULFATE 2 GM/50ML IV SOLN
2.0000 g | Freq: Once | INTRAVENOUS | Status: AC
  Administered 2024-07-09: 2 g via INTRAVENOUS
  Filled 2024-07-09: qty 50

## 2024-07-09 MED ORDER — PANTOPRAZOLE SODIUM 40 MG PO TBEC
40.0000 mg | DELAYED_RELEASE_TABLET | Freq: Every day | ORAL | Status: DC
  Administered 2024-07-09 – 2024-07-10 (×2): 40 mg via ORAL
  Filled 2024-07-09 (×2): qty 1

## 2024-07-09 MED ORDER — ORAL CARE MOUTH RINSE: 15.0000 mL | OROMUCOSAL | Status: DC | PRN

## 2024-07-09 MED ORDER — OXYCODONE-ACETAMINOPHEN 5-325 MG PO TABS
2.0000 | ORAL_TABLET | Freq: Four times a day (QID) | ORAL | Status: DC | PRN
  Administered 2024-07-09 – 2024-07-10 (×3): 2 via ORAL
  Filled 2024-07-09 (×3): qty 2

## 2024-07-09 MED ORDER — HEPARIN SODIUM (PORCINE) 5000 UNIT/ML IJ SOLN
5000.0000 [IU] | Freq: Three times a day (TID) | INTRAMUSCULAR | Status: DC
  Administered 2024-07-09 – 2024-07-10 (×4): 5000 [IU] via SUBCUTANEOUS
  Filled 2024-07-09 (×4): qty 1

## 2024-07-09 MED ORDER — CLONAZEPAM 1 MG PO TABS
1.0000 mg | ORAL_TABLET | Freq: Two times a day (BID) | ORAL | Status: DC | PRN
  Administered 2024-07-10 (×2): 1 mg via ORAL
  Filled 2024-07-09 (×2): qty 1

## 2024-07-09 MED ORDER — GLUCAGON HCL RDNA (DIAGNOSTIC) 1 MG IJ SOLR
1.0000 mg | Freq: Once | INTRAMUSCULAR | Status: AC
  Administered 2024-07-09: 1 mg via INTRAVENOUS
  Filled 2024-07-09: qty 1

## 2024-07-09 MED ORDER — SODIUM CHLORIDE 0.9 % IV BOLUS
1000.0000 mL | Freq: Once | INTRAVENOUS | Status: AC
  Administered 2024-07-09: 1000 mL via INTRAVENOUS

## 2024-07-09 MED ORDER — ZOLPIDEM TARTRATE 5 MG PO TABS: 5.0000 mg | ORAL_TABLET | Freq: Every evening | ORAL | Status: DC | PRN

## 2024-07-09 MED ORDER — CHLORHEXIDINE GLUCONATE CLOTH 2 % EX PADS: 6.0000 | MEDICATED_PAD | Freq: Every day | CUTANEOUS | Status: DC

## 2024-07-09 MED ORDER — ONDANSETRON HCL 4 MG/2ML IJ SOLN
4.0000 mg | Freq: Once | INTRAMUSCULAR | Status: AC
  Administered 2024-07-09: 4 mg via INTRAVENOUS
  Filled 2024-07-09: qty 2

## 2024-07-09 MED ORDER — CLONAZEPAM 1 MG PO TABS: 1.0000 mg | ORAL_TABLET | Freq: Three times a day (TID) | ORAL | Status: DC | PRN

## 2024-07-09 MED ORDER — DEXTROSE-SODIUM CHLORIDE 5-0.9 % IV SOLN: INTRAVENOUS | Status: DC

## 2024-07-09 MED ORDER — DEXTROSE 50 % IV SOLN
1.0000 | Freq: Once | INTRAVENOUS | Status: AC
  Administered 2024-07-09: 50 mL via INTRAVENOUS
  Filled 2024-07-09: qty 50

## 2024-07-09 MED ORDER — INSULIN ASPART 100 UNIT/ML IJ SOLN
0.0000 [IU] | INTRAMUSCULAR | Status: DC
  Administered 2024-07-09: 15 [IU] via SUBCUTANEOUS
  Administered 2024-07-09: 11 [IU] via SUBCUTANEOUS
  Administered 2024-07-09: 8 [IU] via SUBCUTANEOUS
  Administered 2024-07-09: 11 [IU] via SUBCUTANEOUS
  Administered 2024-07-10: 5 [IU] via SUBCUTANEOUS
  Administered 2024-07-10: 8 [IU] via SUBCUTANEOUS
  Administered 2024-07-10: 5 [IU] via SUBCUTANEOUS

## 2024-07-09 MED ORDER — POTASSIUM CHLORIDE 10 MEQ/100ML IV SOLN
10.0000 meq | INTRAVENOUS | Status: AC
  Administered 2024-07-09 (×5): 10 meq via INTRAVENOUS
  Filled 2024-07-09 (×5): qty 100

## 2024-07-09 MED ORDER — ARIPIPRAZOLE 5 MG PO TABS
10.0000 mg | ORAL_TABLET | Freq: Every day | ORAL | Status: DC
  Administered 2024-07-09 – 2024-07-10 (×2): 10 mg via ORAL
  Filled 2024-07-09 (×2): qty 2

## 2024-07-09 MED ORDER — DEXTROSE 10 % IV SOLN: INTRAVENOUS | Status: DC

## 2024-07-09 NOTE — ED Notes (Signed)
 Patient ambulated to the bathroom.

## 2024-07-09 NOTE — Progress Notes (Addendum)
 eLink Physician-Brief Progress Note Patient Name: Vanessa Barajas DOB: [birth year] MRN: [id number]   Date of Service  07/09/2024  HPI/Events of Note  53 y/o female with PMH for DMT2 on insulin , GERD, Osteoarthritis, Knee Pain, HTN, Anxiety, Depression, morbid obesity class III who was BIB EMS with c/o intentional overdose to end her life.   Complaining of itching, happens sometimes when she takes oxycodone .  eICU Interventions  Calamine lotion as needed, avoid sedating medications   2054 - poison control is requesting EKG, CMP and a Mag for recheck because her Qtc has gone up.Will Follow  Intervention Category Minor Interventions: Routine modifications to care plan (e.g. PRN medications for pain, fever)  Talvin Christianson 07/09/2024, 7:54 PM

## 2024-07-09 NOTE — H&P (Signed)
 NAME:  Vanessa Barajas, MRN:  [id number], DOB:  [birth year], LOS: 0 ADMISSION DATE:  07/09/2024, CONSULTATION DATE:  07/09/2024 REFERRING MD:  April Palumbo, MD, CHIEF COMPLAINT:  Intentional Insulin  Overdose   History of Present Illness:  53 y/o female with PMH for DMT2 on insulin , GERD, Osteoarthritis, Knee Pain, HTN, Anxiety, Depression, morbid obesity class III who was BIB EMS with c/o intentional overdose to end her life.  She apparently took 100 units of fast acting insulin  around midnight.  According to records, she had a DNR form and she said her sister was POA which her sister was not aware of.  She initially refused treatment and her initial FS was 83 at 0143am and as low as 55.  She was given D50, started on D10 drip, Glucagon , Decadron .  Her K was 2.5, UTS pos for Benzos.  Pertinent  Medical History  DMT2 on insulin , GERD, , Knee Pain, HTN, Anxiety, Depression, morbid obesity class III   Significant Hospital Events: Including procedures, antibiotic start and stop dates in addition to other pertinent events   8/30: Admit to ICU  Interim History / Subjective:  N/A  Objective    Blood pressure 137/73, pulse 95, temperature 97.8 F (36.6 C), resp. rate 18, height 5' 7 (1.702 m), weight 125.6 kg, SpO2 96%.        Intake/Output Summary (Last 24 hours) at 07/09/2024 9376 Last data filed at 07/09/2024 0416 Gross per 24 hour  Intake 42.43 ml  Output --  Net 42.43 ml   Filed Weights   07/09/24 0218  Weight: 125.6 kg    Examination: General: alert NAD HENT: PERRLA no icterus, EOMI, supple no JVD Lungs: CTA no wheezes rales or rhonchi Cardiovascular: reg, s1s2 no murmurs or gallops Abdomen: soft obese, BS pos no guarding Extremities: no cyanosis, clubbing or edema Neuro: AAO x 3, CN II to XII grossly intact GU: N/A  Resolved problem list   Assessment and Plan  Intentional Short acting Insulin  over dose Will need Psych clearance prior to d/c home Will need a  sitter Iatrogenic Hypoglycemia Currently on D10 and will continue Q1hr fingersticks for now H/o Depression and Major depression Psych eval and social services involvement HTN Can resume home meds Hypokalemia Replace K AGMA Monitor for worsening acidosis as Metformin listed as a home medication      Labs   CBC: Recent Labs  Lab 07/09/24 0253  WBC 10.3  NEUTROABS 6.9  HGB 13.8  HCT 40.2  MCV 85.9  PLT 253    Basic Metabolic Panel: Recent Labs  Lab 07/09/24 0253  NA 144  K 2.5*  CL 107  CO2 21*  GLUCOSE 60*  BUN 13  CREATININE 0.75  CALCIUM 9.4   GFR: Estimated Creatinine Clearance: 109.4 mL/min (by C-G formula based on SCr of 0.75 mg/dL). Recent Labs  Lab 07/09/24 0253  WBC 10.3    Liver Function Tests: Recent Labs  Lab 07/09/24 0253  AST 25  ALT 20  ALKPHOS 126  BILITOT 0.3  PROT 6.7  ALBUMIN 3.9   No results for input(s): LIPASE, AMYLASE in the last 168 hours. No results for input(s): AMMONIA in the last 168 hours.  ABG    Component Value Date/Time   HCO3 23.9 07/31/2011 1435   TCO2 22 03/13/2016 1120   O2SAT 72.0 07/31/2011 1435     Coagulation Profile: No results for input(s): INR, PROTIME in the last 168 hours.  Cardiac Enzymes: No results for input(s): CKTOTAL, CKMB, CKMBINDEX, TROPONINI  in the last 168 hours.  HbA1C: Hgb A1c MFr Bld  Date/Time Value Ref Range Status  08/01/2011 03:10 AM 15.3 (H) <5.7 % Final    Comment:    (NOTE) Result repeated and verified.                                                                       According to the ADA Clinical Practice Recommendations for 2011, when HbA1c is used as a screening test:  >=6.5%   Diagnostic of Diabetes Mellitus           (if abnormal result is confirmed) 5.7-6.4%   Increased risk of developing Diabetes Mellitus References:Diagnosis and Classification of Diabetes Mellitus,Diabetes Care,2011,34(Suppl 1):S62-S69 and Standards of Medical Care in          Diabetes - 2011,Diabetes Care,2011,34 (Suppl 1):S11-S61.    CBG: Recent Labs  Lab 07/09/24 0233 07/09/24 0416 07/09/24 0547  GLUCAP 55* 73 87    Review of Systems:   10 point ROS conducted as above otherwise negative  Past Medical History:  She,  has a past medical history of Anxiety, Diabetes mellitus without complication (HCC), Hemorrhoids, and Hypertension.   Surgical History:   Past Surgical History:  Procedure Laterality Date   ACHILLES TENDON SURGERY     ADENOIDECTOMY     ECTOPIC PREGNANCY SURGERY     x2   HERNIA REPAIR     TONSILLECTOMY     TUBAL LIGATION       Social History:   reports that she quit smoking about 10 years ago. She has never used smokeless tobacco. She reports that she does not drink alcohol and does not use drugs.   Family History:  Her family history includes Cancer in her maternal grandmother and sister; Cancer (age of onset: 46) in her mother; Hypertension in her mother; Leukemia in her mother.   Allergies Allergies  Allergen Reactions   Lisinopril Swelling    Angioedema    Losartan Swelling     Home Medications  Prior to Admission medications   Medication Sig Start Date End Date Taking? Authorizing Provider  ALPRAZolam  (XANAX  XR) 2 MG 24 hr tablet Take 2 mg by mouth at bedtime.    [provider]  amLODipine  (NORVASC ) 5 MG tablet Take 1 tablet (5 mg total) by mouth daily. 09/13/18   Henderly, Britni A, PA-C  diclofenac (VOLTAREN) 75 MG EC tablet Take 75 mg by mouth 2 (two) times daily. 10/21/17   [provider]  escitalopram  (LEXAPRO ) 20 MG tablet Take 20 mg by mouth at bedtime.     [provider]  fluticasone  (FLONASE ) 50 MCG/ACT nasal spray Place 2 sprays into both nostrils daily as needed for allergies or rhinitis.    [provider]  gabapentin (NEURONTIN) 100 MG capsule Take 100-200 mg by mouth at bedtime as needed (restless leg).  02/12/18   [provider]  ibuprofen  (ADVIL,MOTRIN) 200 MG tablet Take 600 mg by mouth every 6 (six) hours as needed for mild pain.    [provider]  ketorolac  (TORADOL ) 10 MG tablet Take 1 tablet (10 mg total) by mouth every 6 (six) hours as needed for moderate pain. Patient not taking: Reported on 09/13/2018 03/29/18  Letha Renshaw, CNM  megestrol  (MEGACE ) 40 MG tablet Take 1 tablet (40 mg total) by mouth 2 (two) times daily. Can increase to two tablets twice a day in the event of heavy bleeding 03/25/19   Corene Coy, MD  metFORMIN (GLUCOPHAGE) 1000 MG tablet Take 1,000 mg by mouth 2 (two) times daily with a meal.  09/10/18   [provider]  polyethylene glycol (MIRALAX / GLYCOLAX) packet Take 17 g by mouth daily.    [provider]  predniSONE  (DELTASONE ) 10 MG tablet Take 5 tablets (50 mg total) by mouth daily with breakfast. And decrease by one tablet daily 09/14/18   Rancour, Garnette, MD  traMADol  (ULTRAM ) 50 MG tablet Take 50 mg by mouth every 6 (six) hours as needed for moderate pain.    [provider]     Critical care time: 62   The patient is critically ill with multiple organ system failure and requires high complexity decision making for assessment and support, frequent evaluation and titration of therapies, advanced monitoring, review of radiographic studies and interpretation of complex data.   Critical Care Time devoted to patient care services, exclusive of separately billable procedures, described in this note is 32 minutes.   Orlin Fairly, MD [facility] See Amion for pager  If no response to pager , please call [phone] until 7pm After 7:00 pm call Elink  [phone] 07/09/2024, 6:24 AM

## 2024-07-09 NOTE — Progress Notes (Signed)
 NAME:  Vanessa Barajas, MRN:  [id number], DOB:  29-Jan-1971, LOS: 0 ADMISSION DATE:  07/09/2024, CONSULTATION DATE:  07/09/2024 REFERRING MD:  April Palumbo, MD, CHIEF COMPLAINT:  Intentional Insulin  Overdose   History of Present Illness:  53 y/o female with PMH for DMT2 on insulin , GERD, Osteoarthritis, Knee Pain, HTN, Anxiety, Depression, morbid obesity class III who was BIB EMS with c/o intentional overdose to end her life.  She apparently took 100 units of fast acting insulin  around midnight.  According to records, she had a DNR form and she said her sister was POA which her sister was not aware of.  She initially refused treatment and her initial FS was 83 at 0143am and as low as 55.  She was given D50, started on D10 drip, Glucagon , Decadron .  Her K was 2.5, UTS pos for Benzos.  Pertinent  Medical History  DMT2 on insulin , GERD, , Knee Pain, HTN, Anxiety, Depression, morbid obesity class III   Significant Hospital Events: Including procedures, antibiotic start and stop dates in addition to other pertinent events   8/30: Admit to ICU, hemodynamics have been stable  Interim History / Subjective:  Feels comfortable currently Denies any pain or discomfort, no headache, no chest pain or discomfort, no abdominal pain or discomfort  Objective    Blood pressure 117/80, pulse 99, temperature 97.8 F (36.6 C), resp. rate 18, height 5' 7 (1.702 m), weight 125.6 kg, SpO2 94%.        Intake/Output Summary (Last 24 hours) at 07/09/2024 0934 Last data filed at 07/09/2024 0416 Gross per 24 hour  Intake 42.43 ml  Output --  Net 42.43 ml   Filed Weights   07/09/24 0218  Weight: 125.6 kg    Examination: General: Middle-age, does not appear to be in distress HENT: Pupils equal and reacting, moist oral mucosa Lungs: Clear breath sounds bilaterally Cardiovascular: S1-S2 appreciated Abdomen: Soft, bowel sounds appreciated Extremities: No clubbing, no edema Neuro: No neurological focality GU:  N/A  I reviewed last 24 h vitals and pain scores, last 48 h intake and output, last 24 h labs and trends, and last 24 h imaging results.  Resolved problem list   Assessment and Plan   Intentional drug overdose - Stated she was feeling more depressed than usual - Psych consult - Will need a sitter  Iatrogenic hypoglycemia - Continue D10 - Continue monitoring sugars  Electrolyte derangement Hypokalemia - Being repleted - Will need to continue monitoring  Metabolic acidosis -Home meds on hold  Type 2 diabetes - Will monitor   Labs   CBC: Recent Labs  Lab 07/09/24 0253 07/09/24 0654  WBC 10.3 11.4*  NEUTROABS 6.9  --   HGB 13.8 13.0  HCT 40.2 39.0  MCV 85.9 87.1  PLT 253 218    Basic Metabolic Panel: Recent Labs  Lab 07/09/24 0253 07/09/24 0654  NA 144 140  K 2.5* 3.6  CL 107 106  CO2 21* 22  GLUCOSE 60* 87  BUN 13 13  CREATININE 0.75 0.65  CALCIUM 9.4 9.0  MG  --  2.2  PHOS  --  2.0*   GFR: Estimated Creatinine Clearance: 109.4 mL/min (by C-G formula based on SCr of 0.65 mg/dL). Recent Labs  Lab 07/09/24 0253 07/09/24 0654 07/09/24 0732  WBC 10.3 11.4*  --   LATICACIDVEN  --   --  2.4*    Liver Function Tests: Recent Labs  Lab 07/09/24 0253 07/09/24 0654  AST 25 22  ALT 20 19  ALKPHOS 126 111  BILITOT 0.3 0.3  PROT 6.7 6.3*  ALBUMIN 3.9 3.7   No results for input(s): LIPASE, AMYLASE in the last 168 hours. No results for input(s): AMMONIA in the last 168 hours.  ABG    Component Value Date/Time   HCO3 27.2 07/09/2024 0654   TCO2 22 03/13/2016 1120   O2SAT 76.4 07/09/2024 0654     Coagulation Profile: No results for input(s): INR, PROTIME in the last 168 hours.  Cardiac Enzymes: No results for input(s): CKTOTAL, CKMB, CKMBINDEX, TROPONINI in the last 168 hours.  HbA1C: Hgb A1c MFr Bld  Date/Time Value Ref Range Status  08/01/2011 03:10 AM 15.3 (H) <5.7 % Final    Comment:    (NOTE) Result repeated  and verified.                                                                       According to the ADA Clinical Practice Recommendations for 2011, when HbA1c is used as a screening test:  >=6.5%   Diagnostic of Diabetes Mellitus           (if abnormal result is confirmed) 5.7-6.4%   Increased risk of developing Diabetes Mellitus References:Diagnosis and Classification of Diabetes Mellitus,Diabetes Care,2011,34(Suppl 1):S62-S69 and Standards of Medical Care in         Diabetes - 2011,Diabetes Care,2011,34 (Suppl 1):S11-S61.    CBG: Recent Labs  Lab 07/09/24 0233 07/09/24 0416 07/09/24 0547 07/09/24 0657 07/09/24 0754  GLUCAP 55* 73 87 68* 119*    Review of Systems:   10 point ROS conducted as above otherwise negative  Past Medical History:  She,  has a past medical history of Anxiety, Diabetes mellitus without complication (HCC), Hemorrhoids, and Hypertension.   Surgical History:   Past Surgical History:  Procedure Laterality Date   ACHILLES TENDON SURGERY     ADENOIDECTOMY     ECTOPIC PREGNANCY SURGERY     x2   HERNIA REPAIR     TONSILLECTOMY     TUBAL LIGATION       Social History:   reports that she quit smoking about 10 years ago. She has never used smokeless tobacco. She reports that she does not drink alcohol and does not use drugs.   Family History:  Her family history includes Cancer in her maternal grandmother and sister; Cancer (age of onset: 74) in her mother; Hypertension in her mother; Leukemia in her mother.   Allergies Allergies  Allergen Reactions   Lisinopril Swelling    Angioedema    Losartan Swelling    Additional CCM time 30 minutes

## 2024-07-09 NOTE — ED Triage Notes (Signed)
 Pt BIBA from home, c/o overdose on 100 units of insulin  intentionally around 12 am. A&Ox4. DNR has a POA form of her sister. Sister was called by EMS and said she knew nothing of her being a POA. Pt refused IV during EMS.   BP 147/81 HR 110 RR 26 O2 90 RA CBG 83 @0143 

## 2024-07-09 NOTE — ED Provider Notes (Signed)
 [place] EMERGENCY DEPARTMENT AT [facility] Provider Note   CSN: [id number] Arrival date & time: 07/09/24  0201     Patient presents with: Drug Overdose (Insulin )   Vanessa Barajas is a 53 y.o. female.   The history is provided by the patient and the EMS personnel. The history is limited by the condition of the patient.  Drug Overdose This is a new problem. The current episode started 1 to 2 hours ago. The problem occurs constantly. The problem has not changed since onset.Pertinent negatives include no chest pain, no abdominal pain, no headaches and no shortness of breath. Nothing aggravates the symptoms. Nothing relieves the symptoms. She has tried nothing for the symptoms. The treatment provided no relief.       Prior to Admission medications   Medication Sig Start Date End Date Taking? Authorizing Provider  ALPRAZolam  (XANAX  XR) 2 MG 24 hr tablet Take 2 mg by mouth at bedtime.    [provider]  amLODipine  (NORVASC ) 5 MG tablet Take 1 tablet (5 mg total) by mouth daily. 09/13/18   Henderly, Britni A, PA-C  diclofenac (VOLTAREN) 75 MG EC tablet Take 75 mg by mouth 2 (two) times daily. 10/21/17   [provider]  escitalopram  (LEXAPRO ) 20 MG tablet Take 20 mg by mouth at bedtime.     [provider]  fluticasone  (FLONASE ) 50 MCG/ACT nasal spray Place 2 sprays into both nostrils daily as needed for allergies or rhinitis.    [provider]  gabapentin (NEURONTIN) 100 MG capsule Take 100-200 mg by mouth at bedtime as needed (restless leg).  02/12/18   [provider]  ibuprofen (ADVIL,MOTRIN) 200 MG tablet Take 600 mg by mouth every 6 (six) hours as needed for mild pain.    [provider]  ketorolac  (TORADOL ) 10 MG tablet Take 1 tablet (10 mg total) by mouth every 6 (six) hours as needed for moderate pain. Patient not taking: Reported on 09/13/2018 03/29/18   Dawson, Rolitta, CNM  megestrol  (MEGACE ) 40 MG tablet Take 1 tablet  (40 mg total) by mouth 2 (two) times daily. Can increase to two tablets twice a day in the event of heavy bleeding 03/25/19   Corene Coy, MD  metFORMIN (GLUCOPHAGE) 1000 MG tablet Take 1,000 mg by mouth 2 (two) times daily with a meal.  09/10/18   [provider]  polyethylene glycol (MIRALAX / GLYCOLAX) packet Take 17 g by mouth daily.    [provider]  predniSONE  (DELTASONE ) 10 MG tablet Take 5 tablets (50 mg total) by mouth daily with breakfast. And decrease by one tablet daily 09/14/18   Rancour, Garnette, MD  traMADol  (ULTRAM ) 50 MG tablet Take 50 mg by mouth every 6 (six) hours as needed for moderate pain.    [provider]    Allergies: Lisinopril and Losartan    Review of Systems  Constitutional:  Negative for fever.  HENT:  Negative for facial swelling.   Respiratory:  Negative for shortness of breath.   Cardiovascular:  Negative for chest pain.  Gastrointestinal:  Negative for abdominal pain.  Neurological:  Negative for headaches.  Psychiatric/Behavioral:  Positive for self-injury.   All other systems reviewed and are negative.   Updated Vital Signs BP (!) 153/69   Pulse 92   Temp 97.8 F (36.6 C)   Resp 16   Ht 5' 7 (1.702 m)   Wt 125.6 kg   SpO2 93%   BMI 43.37 kg/m   Physical  Exam Vitals and nursing note reviewed.  Constitutional:      General: She is not in acute distress.    Appearance: Normal appearance. She is well-developed.  HENT:     Head: Normocephalic and atraumatic.     Nose: Nose normal.  Eyes:     Extraocular Movements: Extraocular movements intact.     Pupils: Pupils are equal, round, and reactive to light.  Cardiovascular:     Rate and Rhythm: Normal rate and regular rhythm.     Pulses: Normal pulses.     Heart sounds: Normal heart sounds.  Pulmonary:     Effort: Pulmonary effort is normal. No respiratory distress.     Breath sounds: Normal breath sounds.  Abdominal:     General: Bowel sounds are  normal. There is no distension.     Palpations: Abdomen is soft.     Tenderness: There is no abdominal tenderness. There is no guarding or rebound.  Musculoskeletal:        General: Normal range of motion.     Cervical back: Normal range of motion and neck supple.  Skin:    General: Skin is warm and dry.     Capillary Refill: Capillary refill takes less than 2 seconds.     Findings: No erythema or rash.  Neurological:     General: No focal deficit present.     Mental Status: She is alert and oriented to person, place, and time.     Deep Tendon Reflexes: Reflexes normal.  Psychiatric:        Thought Content: Thought content includes suicidal ideation. Thought content includes suicidal plan.     (all labs ordered are listed, but only abnormal results are displayed) Results for orders placed or performed during the hospital encounter of 07/09/24  CBG monitoring, ED   Collection Time: 07/09/24  2:33 AM  Result Value Ref Range   Glucose-Capillary 55 (L) 70 - 99 mg/dL  Comprehensive metabolic panel   Collection Time: 07/09/24  2:53 AM  Result Value Ref Range   Sodium 144 135 - 145 mmol/L   Potassium 2.5 (LL) 3.5 - 5.1 mmol/L   Chloride 107 98 - 111 mmol/L   CO2 21 (L) 22 - 32 mmol/L   Glucose, Bld 60 (L) 70 - 99 mg/dL   BUN 13 6 - 20 mg/dL   Creatinine, Ser 9.24 0.44 - 1.00 mg/dL   Calcium 9.4 8.9 - 89.6 mg/dL   Total Protein 6.7 6.5 - 8.1 g/dL   Albumin 3.9 3.5 - 5.0 g/dL   AST 25 15 - 41 U/L   ALT 20 0 - 44 U/L   Alkaline Phosphatase 126 38 - 126 U/L   Total Bilirubin 0.3 0.0 - 1.2 mg/dL   GFR, Estimated >39 >39 mL/min   Anion gap 16 (H) 5 - 15  Salicylate level   Collection Time: 07/09/24  2:53 AM  Result Value Ref Range   Salicylate Lvl <7.0 (L) 7.0 - 30.0 mg/dL  Acetaminophen  level   Collection Time: 07/09/24  2:53 AM  Result Value Ref Range   Acetaminophen  (Tylenol ), Serum <10 (L) 10 - 30 ug/mL  Ethanol   Collection Time: 07/09/24  2:53 AM  Result Value Ref Range    Alcohol, Ethyl (B) <15 <15 mg/dL  CBC WITH DIFFERENTIAL   Collection Time: 07/09/24  2:53 AM  Result Value Ref Range   WBC 10.3 4.0 - 10.5 K/uL   RBC 4.68 3.87 - 5.11 MIL/uL   Hemoglobin 13.8  12.0 - 15.0 g/dL   HCT 59.7 63.9 - 53.9 %   MCV 85.9 80.0 - 100.0 fL   MCH 29.5 26.0 - 34.0 pg   MCHC 34.3 30.0 - 36.0 g/dL   RDW 86.4 88.4 - 84.4 %   Platelets 253 150 - 400 K/uL   nRBC 0.0 0.0 - 0.2 %   Neutrophils Relative % 65 %   Neutro Abs 6.9 1.7 - 7.7 K/uL   Lymphocytes Relative 22 %   Lymphs Abs 2.2 0.7 - 4.0 K/uL   Monocytes Relative 9 %   Monocytes Absolute 0.9 0.1 - 1.0 K/uL   Eosinophils Relative 2 %   Eosinophils Absolute 0.2 0.0 - 0.5 K/uL   Basophils Relative 1 %   Basophils Absolute 0.1 0.0 - 0.1 K/uL   Immature Granulocytes 1 %   Abs Immature Granulocytes 0.08 (H) 0.00 - 0.07 K/uL   No results found.  EKG: EKG Interpretation Date/Time:  Saturday July 09 2024 02:34:48 EDT Ventricular Rate:  107 PR Interval:  161 QRS Duration:  95 QT Interval:  350 QTC Calculation: 467 R Axis:   70  Text Interpretation: Sinus tachycardia Ventricular premature complex Confirmed by Nettie, Kerly Rigsbee ([id number]) on 07/09/2024 3:36:20 AM  Radiology: No results found.   .Critical Care  Performed by: Nettie Earing, MD Authorized by: Nettie Earing, MD   Critical care provider statement:    Critical care time (minutes):  60   Critical care end time:  07/09/2024 4:14 AM   Critical care was necessary to treat or prevent imminent or life-threatening deterioration of the following conditions:  Toxidrome and endocrine crisis   Critical care was time spent personally by me on the following activities:  Development of treatment plan with patient or surrogate, discussions with consultants, evaluation of patient's response to treatment, examination of patient, ordering and review of laboratory studies, ordering and review of radiographic studies, ordering and performing treatments and  interventions, pulse oximetry, re-evaluation of patient's condition and review of old charts   I assumed direction of critical care for this patient from another provider in my specialty: no     Care discussed with: admitting provider      Medications Ordered in the ED  sodium chloride  0.9 % bolus 1,000 mL (0 mLs Intravenous Stopped 07/09/24 0306)    And  0.9 %  sodium chloride  infusion ( Intravenous Not Given 07/09/24 0306)  dextrose  10 % infusion ( Intravenous New Bag/Given 07/09/24 0307)  magnesium  sulfate IVPB 2 g 50 mL (2 g Intravenous New Bag/Given 07/09/24 0359)  potassium chloride  10 mEq in 100 mL IVPB (has no administration in time range)  glucagon  (human recombinant) (GLUCAGEN ) injection 1 mg (has no administration in time range)  dextrose  50 % solution 50 mL (50 mLs Intravenous Given 07/09/24 0251)  ondansetron  (ZOFRAN ) injection 4 mg (4 mg Intravenous Given 07/09/24 0300)  dexamethasone  (DECADRON ) injection 4 mg (4 mg Intravenous Given 07/09/24 0357)                                    Medical Decision Making Patient who intentionally overdosed on 100 units of insulin    Amount and/or Complexity of Data Reviewed Independent Historian: EMS    Details: See above  External Data Reviewed: notes.    Details: Previous notes reviewed  Labs: ordered.    Details: Glucose is low 55, normal sodium 144, potassium is low 2.5, normal LFTs.  negative ETOH, negative tylenol  level, negative salicylate level.  White count normal 10.3 normal hemoglobin 13.8, normal platelet count.   ECG/medicine tests: ordered and independent interpretation performed. Decision-making details documented in ED Course. Discussion of management or test interpretation with external provider(s): Critical care for admission   Risk Prescription drug management. Decision regarding hospitalization. Risk Details: Poison control recommends admission to clear.  As patient is on D10 will admit to ICU for Q1hour check sugars  per poison control. Sugars continue low have given glucagon  and increased d10 DRIP      Final diagnoses:  Intentional drug overdose, initial encounter (HCC)  Hypokalemia  Insulin  overdose, intentional self-harm, initial encounter [facility])   The patient appears reasonably stabilized for admission considering the current resources, flow, and capabilities available in the ED at this time, and I doubt any other [facility] requiring further screening and/or treatment in the ED prior to admission.  ED Discharge Orders     None          Yaroslav Gombos, MD 07/09/24 [id number]

## 2024-07-09 NOTE — ED Notes (Signed)
 Patient given saltines and soda

## 2024-07-09 NOTE — Consult Note (Signed)
 [facility] Psychiatric Consult Initial  Patient Name: .Vanessa Barajas  MRN: [id number]  DOB: [birth year]  Consult Order details:  Orders (From admission, onward)     Start     Ordered   07/09/24 1321  IP CONSULT TO PSYCHIATRY       Ordering Provider: Ezzard Staci SAILOR, NP  Provider:  (Not yet assigned)  Question Answer Comment  Location [facility]   Reason for Consult? suicide attempt      07/09/24 1321             Mode of Visit: In person    Psychiatry Consult Evaluation  Service Date: July 09, 2024 LOS:  LOS: 0 days  Chief Complaint I've been depressed for about 3 weeks.  Primary Psychiatric Diagnoses  MDD, severe, without psychosis 2.  Intential overdose   Assessment  Vanessa Barajas is a 53 y.o. female admitted: Medicallyfor 07/09/2024  2:03 AM per Dr. Neda with PMH for DMT2 on insulin , GERD, Osteoarthritis, Knee Pain, HTN, Anxiety, Depression, morbid obesity class III who was BIB EMS with c/o intentional overdose to end her life.   Her current presentation of dysphoric mood, low energy and motivation, hopeless and worthless feelings with suicidal ideations is most consistent with MDD, severe. Current outpatient psychotropic medications include Abilify , Klonopin , and recently Ambien  and historically she has had a semi-positive response to these medications. She was non compliant with medications prior to admission as evidenced by self-report of not taking her medications since Wednesday. On initial examination, patient was depressed and tearful, financial concerns and recent loss of her job. Please see plan below for detailed recommendations.   Diagnoses:  Active Hospital problems: Principal Problem:   Insulin  overdose, intentional self-harm, initial encounter [facility]) Active Problems:   Major depressive disorder, recurrent severe without psychotic features (HCC)   Suicide attempt (HCC)    Plan   ## Psychiatric Medication Recommendations:  MDD,  severe, without psychosis: -Continue Zoloft  200 mg daily -Continue Abilify  10 mg daily  Insomnia: -Continue Ambien  5 mg at bedtime PRN sleep, do not give at the same time as PRN Klonopin   GAD: -Decreased Klonopin  that she reported of 3 mg daily to 1 mg BID since she has not taken it since Wednesday  ## Medical Decision Making Capacity: Not specifically addressed in this encounter  ## Further Work-up:  -- most recent EKG on 07/09/2024 had QtC of 467 -- Pertinent labwork reviewed earlier this admission includes: CBC, chem panel, toxicology, urine, EKG   ## Disposition:-- We recommend inpatient psychiatric hospitalization when medically cleared. Patient is under voluntary admission status at this time; please IVC if attempts to leave hospital.  ## Behavioral / Environmental: - No specific recommendations at this time.     ## Safety and Observation Level:  - Based on my clinical evaluation, I estimate the patient to be at high risk of self harm in the current setting. - At this time, we recommend  1:1 Observation. This decision is based on my review of the chart including patient's history and current presentation, interview of the patient, mental status examination, and consideration of suicide risk including evaluating suicidal ideation, plan, intent, suicidal or self-harm behaviors, risk factors, and protective factors. This judgment is based on our ability to directly address suicide risk, implement suicide prevention strategies, and develop a safety plan while the patient is in the clinical setting. Please contact our team if there is a concern that risk level has changed.  CSSR Risk Category:C-SSRS RISK CATEGORY:  High Risk  Suicide Risk Assessment: Patient has following modifiable risk factors for suicide: under treated depression  and current symptoms: anxiety/panic, insomnia, impulsivity, anhedonia, hopelessness, which we are addressing by medication management and psychiatric  admission after medical clearance. Patient has following non-modifiable or demographic risk factors for suicide: psychiatric hospitalization Patient has the following protective factors against suicide: Supportive friends, no history of suicide attempts, and no history of NSSIB  Thank you for this consult request. Recommendations have been communicated to the primary team.  We will continue to follow at this time.   Sharlot Becker, NP       History of Present Illness  Relevant Aspects of [facility] Course:  Admitted on 07/09/2024 per Dr. Neda with PMH for DMT2 on insulin , GERD, Osteoarthritis, Knee Pain, HTN, Anxiety, Depression, morbid obesity class III who was BIB EMS with c/o intentional overdose to end her life.   Patient Report:  53 yo female presented after an intentional overdose in a suicide attempt.  She stated she has been really depressed for the past 3 weeks since losing her job.  Jesus been depressed for about 3 weeks.  I realized I couldn't pay my bills or rent because I took a week off to take care of my granddaughter when my daughter went to [place].  I was 50 hours over vacation time was realized I wasn't going to get paid.  I got really depressed and didn't go to work the next week.  I'm going to lose everything and my only way out r/t suicide.  Low motivation and energy, hopeless and worthless feelings with dysphoric mood for 3 weeks.  Denies previous suicide attempts.  Admitted at [facility] year for depression and suicidal ideations.  Anxiety is very high with constant worry that she cannot control, restlessness, and mind racing at times.  Denies trauma and mania symptoms, past and present.  No hallucinations, paranoia, or substance abuse except nicotine vaping daily.  Her sleep has been poor with 4-6 hours per night, recently prescribed Ambien  5 mg at bedtime PRN sleep.  I haven't taken my meds since Wednesday.  Her appetite is good.  Support system of one  friend.    Discussed psychiatric admission after medical clearance, no objections.  Her medications are managed by her PCP, no therapy.  Psych ROS:  Depression: high Anxiety:  very high Mania (lifetime and current): none Psychosis: (lifetime and current): none  Review of Systems  Psychiatric/Behavioral:  Positive for depression and suicidal ideas. The patient is nervous/anxious and has insomnia.   All other systems reviewed and are negative.    Psychiatric and Social History  Psychiatric History:  Information collected from patient  Prev Dx/Sx: depression Current Psych Provider: DDr. Fonda Law Home Meds (current): Abilify , Klonopin , Zoloft , and Ambien  Previous Med Trials: unknown Therapy: none  Prior Psych Hospitalization: one at [facility] in 2024 Prior Self Harm: none Prior Violence: none  Family Psych History: none Family Hx suicide: none  Social History:  Educational Hx: completed high school Occupational Hx: recently fired from Nurse, adult Hx: none Living Situation: lives alone  Access to weapons/lethal means: denied   Substance History Denies substance abuse other than vaping nicotine daily  Exam Findings  Physical Exam: completed by MD, reviewed by this provider. Vital Signs:  Temp:  [97.7 F (36.5 C)-98.2 F (36.8 C)] 98.2 F (36.8 C) (08/30 0941) Pulse Rate:  [92-110] 110 (08/30 1300) Resp:  [14-27] 24 (08/30 1300) BP: (103-158)/(54-102) 135/74 (08/30 1300) SpO2:  [  91 %-98 %] 91 % (08/30 1300) Weight:  [125.6 kg] 125.6 kg (08/30 0218) Blood pressure 135/74, pulse (!) 110, temperature 98.2 F (36.8 C), temperature source Oral, resp. rate (!) 24, height 5' 7 (1.702 m), weight 125.6 kg, SpO2 91%. Body mass index is 43.37 kg/m.  Physical Exam  Mental Status Exam: General Appearance: Disheveled  Orientation:  Full (Time, Place, and Person)  Memory:  Immediate;   Good Recent;   Good Remote;   Good  Concentration:  Concentration: Fair and  Attention Span: Fair  Recall:  Good  Attention  Fair  Eye Contact:  Fair  Speech:  Clear and Coherent  Language:  Good  Volume:  Normal  Mood: depressed, anxious  Affect:  Congruent  Thought Process:  Coherent  Thought Content:  Logical  Suicidal Thoughts:  Yes.  without intent/plan  Homicidal Thoughts:  No  Judgement:  Poor  Insight:  Fair  Psychomotor Activity:  Decreased  Akathisia:  No  Fund of Knowledge:  Fair      Assets:  Housing Leisure Time Resilience  Cognition:  WNL  ADL's:  Impaired  AIMS (if indicated):        Other History   These have been pulled in through the EMR, reviewed, and updated if appropriate.  Family History:  The patient's family history includes Cancer in her maternal grandmother and sister; Cancer (age of onset: 79) in her mother; Hypertension in her mother; Leukemia in her mother.  Medical History: Past Medical History:  Diagnosis Date   Anxiety    Diabetes mellitus without complication (HCC)    Hemorrhoids    Hypertension     Surgical History: Past Surgical History:  Procedure Laterality Date   ACHILLES TENDON SURGERY     ADENOIDECTOMY     ECTOPIC PREGNANCY SURGERY     x2   HERNIA REPAIR     TONSILLECTOMY     TUBAL LIGATION       Medications:   Current Facility-Administered Medications:    [COMPLETED] sodium chloride  0.9 % bolus 1,000 mL, 1,000 mL, Intravenous, Once, Stopped at 07/09/24 0306 **AND** 0.9 %  sodium chloride  infusion, , Intravenous, Continuous, Palumbo, April, MD   ARIPiprazole  (ABILIFY ) tablet 10 mg, 10 mg, Oral, Daily, Olalere, Adewale A, MD   Chlorhexidine  Gluconate Cloth 2 % PADS 6 each, 6 each, Topical, Daily, Maree Harder, MD, 6 each at 07/09/24 1256   Chlorhexidine  Gluconate Cloth 2 % PADS 6 each, 6 each, Topical, Q0600, Olalere, Adewale A, MD   clonazePAM  (KLONOPIN ) tablet 3 mg, 3 mg, Oral, Daily, Olalere, Adewale A, MD, 3 mg at 07/09/24 1304   heparin  injection 5,000 Units, 5,000 Units, Subcutaneous,  Q8H, Maree Harder, MD, 5,000 Units at 07/09/24 1303   insulin  aspart (novoLOG ) injection 0-15 Units, 0-15 Units, Subcutaneous, Q4H, Olalere, Adewale A, MD   ondansetron  (ZOFRAN ) injection 4 mg, 4 mg, Intravenous, Q6H PRN, Maree Harder, MD, 4 mg at 07/09/24 1304   Oral care mouth rinse, 15 mL, Mouth Rinse, PRN, Olalere, Adewale A, MD   pantoprazole  (PROTONIX ) EC tablet 40 mg, 40 mg, Oral, Daily, Maree Harder, MD, 40 mg at 07/09/24 9049   sertraline  (ZOLOFT ) tablet 200 mg, 200 mg, Oral, Daily, Olalere, Adewale A, MD, 200 mg at 07/09/24 1304   zolpidem  (AMBIEN ) tablet 5 mg, 5 mg, Oral, QHS PRN, Olalere, Adewale A, MD  Allergies: Allergies  Allergen Reactions   Lisinopril Swelling    Angioedema    Losartan Swelling    Sharlot Becker, NP

## 2024-07-09 NOTE — Progress Notes (Signed)
 Received call for update from poison control. Status updated, recommendations as follows:    Repeat EKG to verify prolonged QTc Repeat CMP and Mag levels  Poison control will call back for updates later, [facility] team notified of requests from poison control.     [id number] Perkins with poison control updated on patient's repeat labs and EKG. PC will touch base in the morning for updates.

## 2024-07-10 ENCOUNTER — Encounter: Payer: Self-pay | Admitting: Family

## 2024-07-10 ENCOUNTER — Other Ambulatory Visit: Payer: Self-pay

## 2024-07-10 ENCOUNTER — Inpatient Hospital Stay
Admission: AD | Admit: 2024-07-10 | Discharge: 2024-07-16 | DRG: 885 | Disposition: A | Discharge status: Home / Self Care | Payer: Self-pay | Source: Intra-hospital | Attending: Psychiatry | Admitting: Psychiatry

## 2024-07-10 DIAGNOSIS — Z7984 Long term (current) use of oral hypoglycemic drugs: Secondary | ICD-10-CM | POA: Diagnosis not present

## 2024-07-10 DIAGNOSIS — Z794 Long term (current) use of insulin: Secondary | ICD-10-CM | POA: Diagnosis not present

## 2024-07-10 DIAGNOSIS — T383X2D Poisoning by insulin and oral hypoglycemic [antidiabetic] drugs, intentional self-harm, subsequent encounter: Secondary | ICD-10-CM

## 2024-07-10 DIAGNOSIS — F332 Major depressive disorder, recurrent severe without psychotic features: Secondary | ICD-10-CM | POA: Diagnosis present

## 2024-07-10 DIAGNOSIS — Z8249 Family history of ischemic heart disease and other diseases of the circulatory system: Secondary | ICD-10-CM

## 2024-07-10 DIAGNOSIS — F419 Anxiety disorder, unspecified: Secondary | ICD-10-CM | POA: Diagnosis present

## 2024-07-10 DIAGNOSIS — Z6841 Body Mass Index (BMI) 40.0 and over, adult: Secondary | ICD-10-CM | POA: Diagnosis not present

## 2024-07-10 DIAGNOSIS — Z5941 Food insecurity: Secondary | ICD-10-CM | POA: Diagnosis not present

## 2024-07-10 DIAGNOSIS — Z9152 Personal history of nonsuicidal self-harm: Secondary | ICD-10-CM | POA: Diagnosis not present

## 2024-07-10 DIAGNOSIS — Z806 Family history of leukemia: Secondary | ICD-10-CM

## 2024-07-10 DIAGNOSIS — Z5982 Transportation insecurity: Secondary | ICD-10-CM

## 2024-07-10 DIAGNOSIS — F17290 Nicotine dependence, other tobacco product, uncomplicated: Secondary | ICD-10-CM | POA: Diagnosis present

## 2024-07-10 DIAGNOSIS — Z5986 Financial insecurity: Secondary | ICD-10-CM

## 2024-07-10 DIAGNOSIS — E66813 Obesity, class 3: Secondary | ICD-10-CM | POA: Diagnosis present

## 2024-07-10 DIAGNOSIS — T383X2A Poisoning by insulin and oral hypoglycemic [antidiabetic] drugs, intentional self-harm, initial encounter: Secondary | ICD-10-CM | POA: Diagnosis not present

## 2024-07-10 DIAGNOSIS — I10 Essential (primary) hypertension: Secondary | ICD-10-CM | POA: Diagnosis present

## 2024-07-10 DIAGNOSIS — E119 Type 2 diabetes mellitus without complications: Secondary | ICD-10-CM | POA: Diagnosis present

## 2024-07-10 LAB — CBC
HCT: 35.9 % — ABNORMAL LOW (ref 36.0–46.0)
Hemoglobin: 11.7 g/dL — ABNORMAL LOW (ref 12.0–15.0)
MCH: 29.1 pg (ref 26.0–34.0)
MCHC: 32.6 g/dL (ref 30.0–36.0)
MCV: 89.3 fL (ref 80.0–100.0)
Platelets: 195 K/uL (ref 150–400)
RBC: 4.02 MIL/uL (ref 3.87–5.11)
RDW: 13.6 % (ref 11.5–15.5)
WBC: 7.7 K/uL (ref 4.0–10.5)
nRBC: 0 % (ref 0.0–0.2)

## 2024-07-10 LAB — GLUCOSE, CAPILLARY
Glucose-Capillary: 293 mg/dL — ABNORMAL HIGH (ref 70–99)
Glucose-Capillary: 221 mg/dL — ABNORMAL HIGH (ref 70–99)
Glucose-Capillary: 224 mg/dL — ABNORMAL HIGH (ref 70–99)
Glucose-Capillary: 232 mg/dL — ABNORMAL HIGH (ref 70–99)
Glucose-Capillary: 230 mg/dL — ABNORMAL HIGH (ref 70–99)

## 2024-07-10 LAB — BASIC METABOLIC PANEL WITH GFR
Anion gap: 12 (ref 5–15)
BUN: 16 mg/dL (ref 6–20)
CO2: 21 mmol/L — ABNORMAL LOW (ref 22–32)
Calcium: 8.2 mg/dL — ABNORMAL LOW (ref 8.9–10.3)
Chloride: 105 mmol/L (ref 98–111)
Creatinine, Ser: 0.71 mg/dL (ref 0.44–1.00)
GFR, Estimated: >60 mL/min (ref >60)
Glucose, Bld: 290 mg/dL — ABNORMAL HIGH (ref 70–99)
Potassium: 3.6 mmol/L (ref 3.5–5.1)
Sodium: 138 mmol/L (ref 135–145)

## 2024-07-10 LAB — SARS CORONAVIRUS 2 BY RT PCR: SARS Coronavirus 2 by RT PCR: NEGATIVE

## 2024-07-10 LAB — MAGNESIUM: Magnesium: 2.1 mg/dL (ref 1.7–2.4)

## 2024-07-10 LAB — HEMOGLOBIN A1C
Hgb A1c MFr Bld: 11.4 % — ABNORMAL HIGH (ref 4.8–5.6)
Mean Plasma Glucose: 280.48 mg/dL

## 2024-07-10 MED ORDER — SERTRALINE HCL 50 MG PO TABS
200.0000 mg | ORAL_TABLET | Freq: Every day | ORAL | Status: DC
  Administered 2024-07-11 – 2024-07-16 (×6): 200 mg via ORAL
  Filled 2024-07-10 (×6): qty 4

## 2024-07-10 MED ORDER — PANTOPRAZOLE SODIUM 40 MG PO TBEC
40.0000 mg | DELAYED_RELEASE_TABLET | Freq: Every day | ORAL | Status: DC
  Administered 2024-07-11 – 2024-07-16 (×6): 40 mg via ORAL
  Filled 2024-07-10 (×6): qty 1

## 2024-07-10 MED ORDER — AMLODIPINE BESYLATE 5 MG PO TABS
10.0000 mg | ORAL_TABLET | Freq: Every day | ORAL | Status: DC
  Administered 2024-07-11 – 2024-07-16 (×5): 10 mg via ORAL
  Filled 2024-07-10 (×6): qty 2

## 2024-07-10 MED ORDER — FLUTICASONE PROPIONATE 50 MCG/ACT NA SUSP: 2.0000 | Freq: Every day | NASAL | Status: DC | PRN

## 2024-07-10 MED ORDER — EMPAGLIFLOZIN 25 MG PO TABS
25.0000 mg | ORAL_TABLET | Freq: Every day | ORAL | Status: DC
Start: 2024-07-10 — End: 2024-07-10
  Administered 2024-07-10: 25 mg via ORAL
  Filled 2024-07-10: qty 1

## 2024-07-10 MED ORDER — OLANZAPINE 10 MG IM SOLR: 5.0000 mg | Freq: Three times a day (TID) | INTRAMUSCULAR | Status: DC | PRN

## 2024-07-10 MED ORDER — EMPAGLIFLOZIN 25 MG PO TABS
25.0000 mg | ORAL_TABLET | Freq: Every day | ORAL | Status: DC
  Administered 2024-07-11 – 2024-07-16 (×6): 25 mg via ORAL
  Filled 2024-07-10 (×6): qty 1

## 2024-07-10 MED ORDER — ZOLPIDEM TARTRATE 5 MG PO TABS
5.0000 mg | ORAL_TABLET | Freq: Every evening | ORAL | Status: DC | PRN
  Administered 2024-07-10 – 2024-07-15 (×6): 5 mg via ORAL
  Filled 2024-07-10 (×6): qty 1

## 2024-07-10 MED ORDER — ACETAMINOPHEN 325 MG PO TABS
650.0000 mg | ORAL_TABLET | Freq: Four times a day (QID) | ORAL | Status: DC | PRN
  Filled 2024-07-10: qty 2

## 2024-07-10 MED ORDER — POTASSIUM CHLORIDE CRYS ER 20 MEQ PO TBCR
40.0000 meq | EXTENDED_RELEASE_TABLET | Freq: Once | ORAL | Status: AC
  Administered 2024-07-10: 40 meq via ORAL
  Filled 2024-07-10: qty 2

## 2024-07-10 MED ORDER — ARIPIPRAZOLE 5 MG PO TABS
10.0000 mg | ORAL_TABLET | Freq: Every day | ORAL | Status: DC
  Administered 2024-07-11: 10 mg via ORAL
  Filled 2024-07-10: qty 2

## 2024-07-10 MED ORDER — OLANZAPINE 5 MG PO TBDP: 5.0000 mg | ORAL_TABLET | Freq: Three times a day (TID) | ORAL | Status: DC | PRN

## 2024-07-10 MED ORDER — OXYCODONE-ACETAMINOPHEN 5-325 MG PO TABS: 2.0000 | ORAL_TABLET | Freq: Four times a day (QID) | ORAL | Status: DC | PRN

## 2024-07-10 MED ORDER — CLONAZEPAM 1 MG PO TABS
1.0000 mg | ORAL_TABLET | Freq: Two times a day (BID) | ORAL | Status: DC | PRN
  Administered 2024-07-10 – 2024-07-12 (×4): 1 mg via ORAL
  Filled 2024-07-10 (×4): qty 1

## 2024-07-10 MED ORDER — MAGNESIUM HYDROXIDE 400 MG/5ML PO SUSP
30.0000 mL | Freq: Every day | ORAL | Status: DC | PRN
Start: 2024-07-10 — End: 2024-07-16

## 2024-07-10 MED ORDER — ALUM & MAG HYDROXIDE-SIMETH 200-200-20 MG/5ML PO SUSP: 30.0000 mL | ORAL | Status: DC | PRN

## 2024-07-10 MED ORDER — CALAMINE EX LOTN: 1.0000 | TOPICAL_LOTION | CUTANEOUS | Status: DC | PRN

## 2024-07-10 MED ORDER — INSULIN ASPART 100 UNIT/ML IJ SOLN
0.0000 [IU] | INTRAMUSCULAR | Status: DC
  Administered 2024-07-10 (×2): 5 [IU] via SUBCUTANEOUS
  Administered 2024-07-11: 3 [IU] via SUBCUTANEOUS
  Filled 2024-07-10 (×3): qty 1

## 2024-07-10 NOTE — Plan of Care (Signed)
  Problem: Health Behavior/Discharge Planning: Goal: Ability to manage health-related needs will improve Outcome: Progressing   Problem: Clinical Measurements: Goal: Ability to maintain clinical measurements within normal limits will improve Outcome: Progressing Goal: Will remain free from infection Outcome: Progressing Goal: Diagnostic test results will improve Outcome: Progressing   Problem: Activity: Goal: Risk for activity intolerance will decrease Outcome: Progressing   Problem: Nutrition: Goal: Adequate nutrition will be maintained Outcome: Progressing   Problem: Coping: Goal: Level of anxiety will decrease Outcome: Progressing   Problem: Pain Managment: Goal: General experience of comfort will improve and/or be controlled Outcome: Progressing

## 2024-07-10 NOTE — Discharge Summary (Signed)
 Physician Discharge Summary  Patient ID: Vanessa Barajas MRN: [id number] DOB/AGE: 1971-01-02 53 y.o.  Admit date: 07/09/2024 Discharge date: 07/10/2024  Discharge Diagnoses:  Intentional drug overdose Iatrogenic hypoglycemia History of major depression Hypertension Type 2 diabetes                                                 DISCHARGE PLAN BY DIAGNOSIS    Intentional drug overdose - Being discharged to behavioral health for further management  Iatrogenic hypoglycemia - Resolved  Type 2 diabetes - Resume home medications  Hypertension - Resume home medications  Discharge Plan:    DISCHARGE SUMMARY   Patient admitted following intentional administration of 100 units of insulin , stated she was feeling more depressed than usual.  She activated EMS herself. Was resuscitated in the emergency department.  Given D50, D10 drip, glucagon , Decadron  Electrolyte derangements were addressed Admitted to the ICU for further monitoring  Patient stabilized.  Electrolyte derangement resolved Mental status back to baseline  Evaluated by behavioral health and patient agreeable to inpatient management          SIGNIFICANT DIAGNOSTIC STUDIES   MICRO DATA  -None  ANTIBIOTICS -None  CONSULTS -Behavioral health  TUBES / LINES -Peripheral line   Discharge Exam: General: Middle-age, does not appear to be in distress Neuro: Awake alert oriented x 3 nonfocal CV: S1-S2 appreciated PULM: Clear breath sounds GI: Bowel sounds appreciated Extremities: No clubbing, no edema  Vitals:   07/10/24 0800 07/10/24 0930 07/10/24 1000 07/10/24 1002  BP: 112/64   (!) 99/59  Pulse: 91 89 79 81  Resp: 12 20 15 18   Temp: 98.1 F (36.7 C)     TempSrc: Oral     SpO2: 92% (!) 88% 93% 93%  Weight:      Height:         Discharge Labs  BMET Recent Labs  Lab 07/09/24 0253 07/09/24 0654 07/09/24 2150 07/10/24 0536  NA 144 140 135 138  K 2.5* 3.6 3.3* 3.6  CL 107 106 104 105  CO2  21* 22 21* 21*  GLUCOSE 60* 87 347* 290*  BUN 13 13 12 16   CREATININE 0.75 0.65 0.80 0.71  CALCIUM 9.4 9.0 8.4* 8.2*  MG  --  2.2 2.1 2.1  PHOS  --  2.0*  --   --     CBC Recent Labs  Lab 07/09/24 0253 07/09/24 0654 07/10/24 0536  HGB 13.8 13.0 11.7*  HCT 40.2 39.0 35.9*  WBC 10.3 11.4* 7.7  PLT 253 218 195    Anti-Coagulation No results for input(s): INR in the last 168 hours.    Allergies as of 07/10/2024       Reactions   Lisinopril Swelling   Angioedema   Losartan Swelling        Medication List     STOP taking these medications    NovoLIN R FlexPen ReliOn 100 UNIT/ML FlexPen Generic drug: Insulin  Regular Human       TAKE these medications    amLODipine  10 MG tablet Commonly known as: NORVASC  Take 10 mg by mouth daily.   ARIPiprazole  10 MG tablet Commonly known as: ABILIFY  Take 10 mg by mouth daily.   clonazePAM  1 MG tablet Commonly known as: KLONOPIN  Take 3 mg by mouth daily.   fluticasone  50 MCG/ACT nasal spray Commonly known as: FLONASE  Place  2 sprays into both nostrils daily as needed for allergies or rhinitis.   insulin  glargine 100 UNIT/ML injection Commonly known as: LANTUS Inject 30 Units into the skin every evening.   Jardiance  25 MG Tabs tablet Generic drug: empagliflozin  Take 25 mg by mouth daily.   oxyCODONE -acetaminophen  5-325 MG tablet Commonly known as: PERCOCET/ROXICET Take 2 tablets by mouth every 6 (six) hours as needed for moderate pain (pain score 4-6).   pantoprazole  40 MG tablet Commonly known as: PROTONIX  Take 40 mg by mouth daily.   sertraline  100 MG tablet Commonly known as: ZOLOFT  Take 200 mg by mouth daily.   zolpidem  10 MG tablet Commonly known as: AMBIEN  Take 10 mg by mouth at bedtime as needed for sleep.          Disposition: Behavioral health  Discharged Condition: Vanessa Barajas has met maximum benefit of inpatient care and is medically stable and cleared for discharge.  Patient is  pending follow up as above.      Time spent on disposition:  30 Minutes.   Signed: Jennet Epley, MD [facility] [phone]

## 2024-07-10 NOTE — Progress Notes (Signed)
.  pccm 

## 2024-07-10 NOTE — Progress Notes (Addendum)
 NAME:  Vanessa Barajas, MRN:  [id number], DOB:  Jan 14, 1971, LOS: 1 ADMISSION DATE:  07/09/2024, CONSULTATION DATE:  07/09/2024 REFERRING MD:  April Palumbo, MD, CHIEF COMPLAINT:  Intentional Insulin  Overdose   History of Present Illness:  53 y/o female with PMH for DMT2 on insulin , GERD, Osteoarthritis, Knee Pain, HTN, Anxiety, Depression, morbid obesity class III who was BIB EMS with c/o intentional overdose to end her life.  She apparently took 100 units of fast acting insulin  around midnight.  According to records, she had a DNR form and she said her sister was POA which her sister was not aware of.  She initially refused treatment and her initial FS was 83 at 0143am and as low as 55.  She was given D50, started on D10 drip, Glucagon , Decadron .  Her K was 2.5, UTS pos for Benzos.  Pertinent  Medical History  DMT2 on insulin , GERD, , Knee Pain, HTN, Anxiety, Depression, morbid obesity class III   Significant Hospital Events: Including procedures, antibiotic start and stop dates in addition to other pertinent events   8/30: Admit to ICU, hemodynamics have been stable  Interim History / Subjective:  Doing okay No overnight events Some headache  Objective    Blood pressure (!) 99/59, pulse 81, temperature 98.1 F (36.7 C), temperature source Oral, resp. rate 18, height 5' 7 (1.702 m), weight 133.1 kg, SpO2 93%.        Intake/Output Summary (Last 24 hours) at 07/10/2024 1200 Last data filed at 07/10/2024 0805 Gross per 24 hour  Intake 1108.81 ml  Output --  Net 1108.81 ml   Filed Weights   07/09/24 0218 07/10/24 0500  Weight: 125.6 kg 133.1 kg    Examination: General: Middle-age, does not appear to be in distress HENT: Moist oral mucosa Lungs: Clear breath sounds Cardiovascular: S1-S2 appreciated Abdomen: Soft, bowel sounds appreciated  Extremities: No clubbing, no edema Neuro: No neurological focality GU: N/A  I reviewed last 24 h vitals and pain scores, last 48 h intake and  output, last 24 h labs and trends, and last 24 h imaging results.  Resolved problem list   Assessment and Plan   Intentional drug overdose - Was feeling more depressed than usual -Feels better at present  Agreeable for inpatient psych management  Iatrogenic hypoglycemia - Resolved  Electrolyte derangement Hypokalemia Magnesium  level within normal limits - Resolved  Metabolic acidosis Type 2 diabetes - Home meds on hold   Can be discharged to behavioral health today  Labs   CBC: Recent Labs  Lab 07/09/24 0253 07/09/24 0654 07/10/24 0536  WBC 10.3 11.4* 7.7  NEUTROABS 6.9  --   --   HGB 13.8 13.0 11.7*  HCT 40.2 39.0 35.9*  MCV 85.9 87.1 89.3  PLT 253 218 195    Basic Metabolic Panel: Recent Labs  Lab 07/09/24 0253 07/09/24 0654 07/09/24 2150 07/10/24 0536  NA 144 140 135 138  K 2.5* 3.6 3.3* 3.6  CL 107 106 104 105  CO2 21* 22 21* 21*  GLUCOSE 60* 87 347* 290*  BUN 13 13 12 16   CREATININE 0.75 0.65 0.80 0.71  CALCIUM 9.4 9.0 8.4* 8.2*  MG  --  2.2 2.1 2.1  PHOS  --  2.0*  --   --    GFR: Estimated Creatinine Clearance: 113.1 mL/min (by C-G formula based on SCr of 0.71 mg/dL). Recent Labs  Lab 07/09/24 0253 07/09/24 0654 07/09/24 0732 07/10/24 0536  WBC 10.3 11.4*  --  7.7  LATICACIDVEN  --   --  2.4*  --     Liver Function Tests: Recent Labs  Lab 07/09/24 0253 07/09/24 0654 07/09/24 2150  AST 25 22 14*  ALT 20 19 15   ALKPHOS 126 111 98  BILITOT 0.3 0.3 0.4  PROT 6.7 6.3* 5.8*  ALBUMIN 3.9 3.7 3.5   No results for input(s): LIPASE, AMYLASE in the last 168 hours. No results for input(s): AMMONIA in the last 168 hours.  ABG    Component Value Date/Time   HCO3 27.2 07/09/2024 0654   TCO2 22 03/13/2016 1120   O2SAT 76.4 07/09/2024 0654     Coagulation Profile: No results for input(s): INR, PROTIME in the last 168 hours.  Cardiac Enzymes: No results for input(s): CKTOTAL, CKMB, CKMBINDEX, TROPONINI in the  last 168 hours.  HbA1C: Hgb A1c MFr Bld  Date/Time Value Ref Range Status  08/01/2011 03:10 AM 15.3 (H) <5.7 % Final    Comment:    (NOTE) Result repeated and verified.                                                                       According to the ADA Clinical Practice Recommendations for 2011, when HbA1c is used as a screening test:  >=6.5%   Diagnostic of Diabetes Mellitus           (if abnormal result is confirmed) 5.7-6.4%   Increased risk of developing Diabetes Mellitus References:Diagnosis and Classification of Diabetes Mellitus,Diabetes Care,2011,34(Suppl 1):S62-S69 and Standards of Medical Care in         Diabetes - 2011,Diabetes Care,2011,34 (Suppl 1):S11-S61.    CBG: Recent Labs  Lab 07/09/24 1929 07/09/24 2318 07/10/24 0403 07/10/24 0745 07/10/24 1151  GLUCAP 317* 287* 293* 221* 224*    Review of Systems:   10 point ROS conducted as above otherwise negative  Past Medical History:  She,  has a past medical history of Anxiety, Diabetes mellitus without complication (HCC), Hemorrhoids, and Hypertension.   Surgical History:   Past Surgical History:  Procedure Laterality Date   ACHILLES TENDON SURGERY     ADENOIDECTOMY     ECTOPIC PREGNANCY SURGERY     x2   HERNIA REPAIR     TONSILLECTOMY     TUBAL LIGATION       Social History:   reports that she quit smoking about 10 years ago. She has never used smokeless tobacco. She reports that she does not drink alcohol and does not use drugs.   Family History:  Her family history includes Cancer in her maternal grandmother and sister; Cancer (age of onset: 68) in her mother; Hypertension in her mother; Leukemia in her mother.   Allergies Allergies  Allergen Reactions   Lisinopril Swelling    Angioedema    Losartan Swelling    Jennet Epley, MD [facility] Pager: See Tracey

## 2024-07-10 NOTE — Hospital Course (Addendum)
 Vanessa Barajas is a 53 y.o. year old female with PMH of type II DMT on insulin , GERD, Osteoarthritis, Knee Pain, HTN, Anxiety, Depression, morbid obesity class III who was brought in by EMS due to intentional overdose to end her life. She apparently took 100 units of fast acting insulin  around 12 am on 07/09/24. ccording to records, she had a DNR form and she said her sister was POA which her sister was not aware of. She initially refused treatment and her initial FS was 83 at 0143am and as low as 55. She was given D50, started on D10 drip, Glucagon , Decadron . Her K was 2.5, UTS pos for Benzos.  Patient was admitted to ICU, psychiatry was consulted patient remained voluntary. She has been stabilized and transferred out of ICU 8/31.  Subjective: Seen and examined today   Assessment and plan:  Insulin  overdose, intentional self-harm, initial encounter Suicidal intention attempt Depression: Patient has been monitored with poison control.  Initially needing glucagon  Decadron  D10 drip and now doing well off drip.  Blood sugar running higher side in 200s.  No more hypoglycemia, tolerating diet Seen by psychiatry recommending inpatient psych admission awaiting for placement.  Continue Klonopin , Abilify , Zoloft , Ambien  as per psych  Type 2 diabetes Hypoglycemia due to insulin  overdose Metabolic acidosis: Bicarb stable, blood sugar on the higher side, continue sliding scale insulin . holding Lantus. Resume Jardiance  on hold.  Check hemoglobin A1c   Hypokalemia: Due to insulin  overdose.  Resolved  Hypertension history: BP stable.  Continue holding amlodipine   Morbid I Obesity w/ Body mass index is 45.96 kg/m.: Will benefit with PCP follow-up, weight loss,healthy lifestyle.  DVT prophylaxis: heparin  injection 5,000 Units Start: 07/09/24 0645 SCDs Start: 07/09/24 0641 Code Status:   Code Status: Limited: Do not attempt resuscitation (DNR) -DNR-LIMITED -Do Not Intubate/DNI  Family Communication: plan  of care discussed with patient at bedside. Patient status is: Remains hospitalized because of severity of illness Level of care: ICU   Dispo: The patient is from: home            Anticipated disposition: TBD, patient psych Objective: Vitals last 24 hrs: Vitals:   07/10/24 0100 07/10/24 0300 07/10/24 0400 07/10/24 0500  BP: 112/68 (!) 114/55 94/69   Pulse: 91 89 89   Resp:  (!) 27 12   Temp:   98.2 F (36.8 C)   TempSrc:   Oral   SpO2: 93% 91% 90%   Weight:    133.1 kg  Height:    5' 7 (1.702 m)    Physical Examination: *** General exam: alert awake, oriented, older than stated age HEENT:Oral mucosa moist, Ear/Nose WNL grossly Respiratory system: Bilaterally clear BS,no use of accessory muscle Cardiovascular system: S1 & S2 +, No JVD. Gastrointestinal system: Abdomen soft,NT,ND, BS+ Nervous System: Alert, awake, moving all extremities,and following commands. Extremities: LE edema neg, distal extremities warm.  Skin: No rashes,no icterus. MSK: Normal muscle bulk,tone, power   Medications reviewed:  Scheduled Meds:  ARIPiprazole   10 mg Oral Daily   Chlorhexidine  Gluconate Cloth  6 each Topical Q0600   heparin   5,000 Units Subcutaneous Q8H   insulin  aspart  0-15 Units Subcutaneous Q4H   pantoprazole   40 mg Oral Daily   sertraline   200 mg Oral Daily   Continuous Infusions: Diet: Diet Order             Diet Carb Modified Fluid consistency: Thin; Room service appropriate? Yes  Diet effective now

## 2024-07-10 NOTE — Progress Notes (Signed)
   07/10/24 2000  Psych Admission Type (Psych Patients Only)  Admission Status Voluntary  Psychosocial Assessment  Patient Complaints Sadness  Eye Contact Fair  Facial Expression Flat  Affect Flat  Speech Logical/coherent;Slow  Interaction Assertive  Motor Activity Slow  Appearance/Hygiene Unremarkable  Mood Sad  Thought Process  Coherency WDL  Delusions None reported or observed  Perception WDL  Hallucination None reported or observed  Judgment Limited  Confusion None  Danger to Self  Current suicidal ideation? Denies  Danger to Others  Danger to Others None reported or observed   Progress note   D: Pt seen in dayroom. Pt denies SI, HI, AVH. Pt rates pain  7/10 as a headache but refused medication. Pt rates anxiety  0/10 and depression  0/10. Pt was given PRN for anxiety before beginning of this writer's shift so she is at ease right now. Pt said she was anxious before because she had never been in this particular facility before. Pt attended group this evening. Pt concerned that she is not receiving her clonazepam  as she takes it at home. Pt educated on how it has been prescribed here and to speak with provider in the morning about any possible changes. Pt verbalized understanding. Spends a lot of time in her room. No other concerns noted at this time.  A: Pt provided support and encouragement. Pt given scheduled medication as prescribed. PRNs as appropriate. Q15 min checks for safety.   R: Pt safe on the unit. Will continue to monitor.

## 2024-07-10 NOTE — Group Note (Signed)
 Date:  07/10/2024 Time:  11:04 PM  Group Topic/Focus:  Identifying Needs:   The focus of this group is to help patients identify their personal needs that have been historically problematic and identify healthy behaviors to address their needs.    Participation Level:  Did Not Attend  Participation Quality:  Did Not Attend  Affect:  Did Not Attend  Cognitive:  Did Not Attend  Insight: None  Engagement in Group:  None  Modes of Intervention:  Exploration  Additional Comments:    Laymon ONEIDA Finder 07/10/2024, 11:04 PM

## 2024-07-10 NOTE — Consult Note (Signed)
 Patient was accepted to [facility] on 12/27/2021 at 2030. Bed assignment: L27.Voluntary consent has been obtained.   Patient meets inpatient admission criteria as determined by Majel Reva Kiang, NP. Attending Physician: Dr. Layne Diagnosis: Major Depressive Disorder (MDD)  Report To: Geropsychiatric Unit - [phone] Arrival Time: After 1330 Orders: Admission orders have been placed by this provider.  Care Team Communication: Bretta Qua (Administrator Coordinator); CHRISTOBAL Guadalajara, MD; Greig Kindle, RN; Delon Croak, RN; Garen Daring, RN; Neda, MD.  Instructions: Please fax voluntary admission paperwork to [phone]. Both documents must be received prior to patient arrival at [facility].

## 2024-07-10 NOTE — Progress Notes (Signed)
 [facility] ADULT ICU REPLACEMENT PROTOCOL   The patient does apply for the [facility] Adult ICU Electrolyte Replacment Protocol based on the criteria listed below:   1.Exclusion criteria: TCTS, ECMO, Dialysis, and Myasthenia Gravis patients 2. Is GFR >/= 30 ml/min? Yes.    Patient's GFR today is >60 3. Is SCr </= 2? Yes.   Patient's SCr is 0.80 mg/dL 4. Did SCr increase >/= 0.5 in 24 hours? No. 5.Pt's weight >40kg  Yes.   6. Abnormal electrolyte(s): K+ 3.3  7. Electrolytes replaced per protocol 8.  Call MD STAT for K+ </= 2.5, Phos </= 1, or Mag </= 1 Physician:  Dr Haze Darner, Norleen Barters 07/10/2024 1:29 AM

## 2024-07-10 NOTE — Tx Team (Signed)
 Initial Treatment Plan 07/10/2024 4:39 PM Paradise Sedam [id number]    PATIENT STRESSORS: Health problems     PATIENT STRENGTHS: Ability for insight    PATIENT IDENTIFIED PROBLEMS:   I need to get better                   DISCHARGE CRITERIA:  Ability to meet basic life and health needs Adequate post-discharge living arrangements Improved stabilization in mood, thinking, and/or behavior Safe-care adequate arrangements made  PRELIMINARY DISCHARGE PLAN: Return to previous living arrangement  PATIENT/FAMILY INVOLVEMENT: This treatment plan has been presented to and reviewed with the patient, [employer]ar[employer] manager. The patient has been given the opportunity to ask questions and make suggestions.  Garen CINDERELLA Daring, RN 07/10/2024, 4:39 PM

## 2024-07-10 NOTE — Plan of Care (Signed)
  Problem: Education: Goal: Knowledge of [facility] Education information/materials will improve Outcome: Not Progressing Goal: Verbalization of understanding the information provided will improve Outcome: Not Progressing   Problem: Health Behavior/Discharge Planning: Goal: Identification of resources available to assist in meeting health care needs will improve Outcome: Not Progressing Goal: Compliance with treatment plan for underlying cause of condition will improve Outcome: Not Progressing

## 2024-07-10 NOTE — Progress Notes (Signed)
 Patient is a 53 year old female admitted voluntarily to the [facility] floor from [facility]Step[facility] after intentional overdose of approx. 100 units of insulin  and non-adherence to medications. She is A+O x 4. She currently denies SI/HI/AVH. She does agree to contract for safety on the unit. Patient's affect is appropriate and speech is logical and coherent. Patient denies depression and anxiety currently but has a history of both. She currently denies pain but states that she gets headaches regularly. Patient denies the use of a mobility aid at home. Reports the use of reading glasses but has left them at home. Reports last BM yesterday July 10, 2024.  Patient reports vaping 2-3 times daily and is not interested in cessation meds. Denies drinking alcohol and substance use. Patient says that her support system is her daughter. Her goal while she is here is "to get better."   Skin assessment and body search completed with Amy, RN. Skin: warm/dry. Skin dry and flaky. Tattoo on back. Bil toe discoloration. No contrabands found.   Emotional support and reassurance provided throughout admission intake. Consents signed. Afterwards, oriented patient to unit, room and call light, reviewed POC with all questions answered and concerns voiced. Patient verbalized understanding. Denies any needs at this time. Will continue to monitor with ongoing Q 15 minute safety checks.

## 2024-07-11 DIAGNOSIS — F332 Major depressive disorder, recurrent severe without psychotic features: Secondary | ICD-10-CM | POA: Diagnosis not present

## 2024-07-11 LAB — GLUCOSE, CAPILLARY
Glucose-Capillary: 164 mg/dL — ABNORMAL HIGH (ref 70–99)
Glucose-Capillary: 165 mg/dL — ABNORMAL HIGH (ref 70–99)
Glucose-Capillary: 170 mg/dL — ABNORMAL HIGH (ref 70–99)
Glucose-Capillary: 208 mg/dL — ABNORMAL HIGH (ref 70–99)

## 2024-07-11 MED ORDER — INSULIN ASPART 100 UNIT/ML IJ SOLN
0.0000 [IU] | Freq: Every day | INTRAMUSCULAR | Status: DC
  Administered 2024-07-11 – 2024-07-14 (×4): 2 [IU] via SUBCUTANEOUS
  Filled 2024-07-11 (×4): qty 1

## 2024-07-11 MED ORDER — ARIPIPRAZOLE 5 MG PO TABS
15.0000 mg | ORAL_TABLET | Freq: Every day | ORAL | Status: DC
  Administered 2024-07-12 – 2024-07-16 (×5): 15 mg via ORAL
  Filled 2024-07-11 (×5): qty 3

## 2024-07-11 MED ORDER — INSULIN ASPART 100 UNIT/ML IJ SOLN
0.0000 [IU] | Freq: Three times a day (TID) | INTRAMUSCULAR | Status: DC
  Administered 2024-07-11 – 2024-07-13 (×5): 3 [IU] via SUBCUTANEOUS
  Administered 2024-07-13 (×2): 5 [IU] via SUBCUTANEOUS
  Administered 2024-07-14 – 2024-07-15 (×4): 3 [IU] via SUBCUTANEOUS
  Administered 2024-07-15: 5 [IU] via SUBCUTANEOUS
  Administered 2024-07-15: 3 [IU] via SUBCUTANEOUS
  Administered 2024-07-16: 5 [IU] via SUBCUTANEOUS
  Administered 2024-07-16: 3 [IU] via SUBCUTANEOUS
  Filled 2024-07-11 (×6): qty 1

## 2024-07-11 NOTE — H&P (Signed)
 Psychiatric Admission Assessment Adult  Patient Identification: Vanessa Barajas MRN:  [id number] Date of Evaluation:  07/11/2024 Chief Complaint:  MDD (major depressive disorder), recurrent episode, severe (HCC) [F33.2]   History of Present Illness: Vanessa Barajas is a 53 y.o. female admitted: Medicallyfor 07/09/2024  2:03 AM per Dr. Neda with PMH for DMT2 on insulin , GERD, Osteoarthritis, Knee Pain, HTN, Anxiety, Depression, morbid obesity class III who was BIB EMS with c/o intentional overdose to end her life. Patient is admitted to [facility] unit with Q15 min safety monitoring. Multidisciplinary team approach is offered. Medication management; group/milieu therapy is offered.   Today on interview patient reports that she is being overwhelmed by finances.  She reports that she has been working at the current employer for 12 years and management changed in the last 1 year.  She reports she works 8-5.  She reports she is able to pay the bills but has hard time keeping up with the food and other requirements at home.  She reports feeling depressed and feeling hopeless and worthless for the last few weeks.  She endorses feeling excessive guilt but has fair energy and motivation and reports fair appetite and sleep.  She reports that she has been having suicidal ideation for the last few weeks and she has planned the overdose.  She reports that after taking the overdose on pills she called 911 when her blood sugars dropped to 56.  She currently is denying SI/HI/plan and denies auditory/visual hallucinations.  She reports having anxiety about her finances but denies having any panic attacks.  She denies any history of abuse and denies ongoing nightmares and flashbacks.  She denies current or recent episodes of mania/hypomania Total Time spent with patient: 1 hour Sleep  Sleep:Sleep: Fair  Past Psychiatric History:  Psychiatric History:  Information collected from Patient/chart  Prev Dx/Sx:  depression Current Psych Provider: DDr. Fonda Law Home Meds (current): Abilify , Klonopin , Zoloft , and Ambien  Previous Med Trials: unknown Therapy: none   Prior Psych Hospitalization: one at [facility] Prior Self Harm: none Prior Violence: none   Family Psych History: none Family Hx suicide: none   Social History:  Educational Hx: completed high school Occupational Hx: recently fired from Nurse, adult Hx: none Living Situation: lives alone  Access to weapons/lethal means: denied    Substance History Denies substance abuse other than vaping nicotine daily Is the patient at risk to self? Yes.    Has the patient been a risk to self in the past 6 months? No.  Has the patient been a risk to self within the distant past? No.  Is the patient a risk to others? No.  Has the patient been a risk to others in the past 6 months? No.  Has the patient been a risk to others within the distant past? No.   [place] Scale:  Flowsheet Row Admission (Current) from 07/10/2024 in [facility] ED to Hosp-Admission (Discharged) from 07/09/2024 in [facility]STEPDOWN  C-SSRS RISK CATEGORY High Risk High Risk     Past Medical History:  Past Medical History:  Diagnosis Date   Anxiety    Diabetes mellitus without complication (HCC)    Hemorrhoids    Hypertension     Past Surgical History:  Procedure Laterality Date   ACHILLES TENDON SURGERY     ADENOIDECTOMY     ECTOPIC PREGNANCY SURGERY     x2   HERNIA REPAIR     TONSILLECTOMY     TUBAL  LIGATION     Family History:  Family History  Problem Relation Age of Onset   Leukemia Mother    Cancer Mother 14       leukemia   Hypertension Mother    Cancer Sister    Cancer Maternal Grandmother        breast    Social History:  Social History   Substance and Sexual Activity  Alcohol Use No     Social History   Substance and Sexual Activity  Drug Use No      Allergies:    Allergies  Allergen Reactions   Lisinopril Swelling    Angioedema    Losartan Swelling   Lab Results:  Results for orders placed or performed during the hospital encounter of 07/10/24 (from the past 48 hours)  Glucose, capillary     Status: Abnormal   Collection Time: 07/10/24  4:42 PM  Result Value Ref Range   Glucose-Capillary 232 (H) 70 - 99 mg/dL    Comment: Glucose reference range applies only to samples taken after fasting for at least 8 hours.  Glucose, capillary     Status: Abnormal   Collection Time: 07/10/24  7:17 PM  Result Value Ref Range   Glucose-Capillary 230 (H) 70 - 99 mg/dL    Comment: Glucose reference range applies only to samples taken after fasting for at least 8 hours.  Glucose, capillary     Status: Abnormal   Collection Time: 07/11/24  7:43 AM  Result Value Ref Range   Glucose-Capillary 164 (H) 70 - 99 mg/dL    Comment: Glucose reference range applies only to samples taken after fasting for at least 8 hours.   Comment 1 Notify RN   Glucose, capillary     Status: Abnormal   Collection Time: 07/11/24 11:19 AM  Result Value Ref Range   Glucose-Capillary 165 (H) 70 - 99 mg/dL    Comment: Glucose reference range applies only to samples taken after fasting for at least 8 hours.  Glucose, capillary     Status: Abnormal   Collection Time: 07/11/24  4:45 PM  Result Value Ref Range   Glucose-Capillary 170 (H) 70 - 99 mg/dL    Comment: Glucose reference range applies only to samples taken after fasting for at least 8 hours.  Glucose, capillary     Status: Abnormal   Collection Time: 07/11/24  7:35 PM  Result Value Ref Range   Glucose-Capillary 208 (H) 70 - 99 mg/dL    Comment: Glucose reference range applies only to samples taken after fasting for at least 8 hours.    Blood Alcohol level:  Lab Results  Component Value Date   [facility] <15 07/09/2024    Metabolic Disorder Labs:  Lab Results  Component Value Date   HGBA1C 11.4 (H) 07/10/2024   MPG 280.48  07/10/2024   MPG 392 (H) 08/01/2011   No results found for: PROLACTIN Lab Results  Component Value Date   CHOL 165 08/01/2011   TRIG 451 (H) 08/01/2011   HDL 22 (L) 08/01/2011   CHOLHDL 7.5 08/01/2011   VLDL UNABLE TO CALCULATE IF TRIGLYCERIDE OVER 400 mg/dL 90/78/7987   LDLCALC UNABLE TO CALCULATE IF TRIGLYCERIDE OVER 400 mg/dL 90/78/7987    Current Medications: Current Facility-Administered Medications  Medication Dose Route Frequency Provider Last Rate Last Admin   acetaminophen  (TYLENOL ) tablet 650 mg  650 mg Oral Q6H PRN Starkes-Perry, Takia S, FNP       alum & mag hydroxide-simeth (MAALOX/MYLANTA) 200-200-20 MG/5ML  suspension 30 mL  30 mL Oral Q4H PRN Starkes-Perry, Takia S, FNP       amLODipine  (NORVASC ) tablet 10 mg  10 mg Oral Daily Wilkie Majel RAMAN, FNP   10 mg at 07/11/24 1011   ARIPiprazole  (ABILIFY ) tablet 10 mg  10 mg Oral Daily Wilkie Majel RAMAN, FNP   10 mg at 07/11/24 1012   calamine lotion 1 Application  1 Application Topical PRN Wilkie Majel RAMAN, FNP       clonazePAM  (KLONOPIN ) tablet 1 mg  1 mg Oral BID PRN Starkes-Perry, Takia S, FNP   1 mg at 07/11/24 1526   empagliflozin  (JARDIANCE ) tablet 25 mg  25 mg Oral Daily Starkes-Perry, Takia S, FNP   25 mg at 07/11/24 1022   fluticasone  (FLONASE ) 50 MCG/ACT nasal spray 2 spray  2 spray Each Nare Daily PRN Starkes-Perry, Takia S, FNP       insulin  aspart (novoLOG ) injection 0-15 Units  0-15 Units Subcutaneous TID WC Lakenzie Mcclafferty, MD   3 Units at 07/11/24 1708   insulin  aspart (novoLOG ) injection 0-5 Units  0-5 Units Subcutaneous QHS Shirly Bartosiewicz, MD       magnesium  hydroxide (MILK OF MAGNESIA) suspension 30 mL  30 mL Oral Daily PRN Starkes-Perry, Takia S, FNP       OLANZapine  (ZYPREXA ) injection 5 mg  5 mg Intramuscular TID PRN Starkes-Perry, Takia S, FNP       OLANZapine  zydis (ZYPREXA ) disintegrating tablet 5 mg  5 mg Oral TID PRN Starkes-Perry, Takia S, FNP       pantoprazole  (PROTONIX ) EC  tablet 40 mg  40 mg Oral Daily Starkes-Perry, Takia S, FNP   40 mg at 07/11/24 1011   sertraline  (ZOLOFT ) tablet 200 mg  200 mg Oral Daily Starkes-Perry, Takia S, FNP   200 mg at 07/11/24 1011   zolpidem  (AMBIEN ) tablet 5 mg  5 mg Oral QHS PRN Starkes-Perry, Takia S, FNP   5 mg at 07/10/24 2122   PTA Medications: Medications Prior to Admission  Medication Sig Dispense Refill Last Dose/Taking   amLODipine  (NORVASC ) 10 MG tablet Take 10 mg by mouth daily.      ARIPiprazole  (ABILIFY ) 10 MG tablet Take 10 mg by mouth daily.      clonazePAM  (KLONOPIN ) 1 MG tablet Take 3 mg by mouth daily.      fluticasone  (FLONASE ) 50 MCG/ACT nasal spray Place 2 sprays into both nostrils daily as needed for allergies or rhinitis.      insulin  glargine (LANTUS) 100 UNIT/ML injection Inject 30 Units into the skin every evening.      JARDIANCE  25 MG TABS tablet Take 25 mg by mouth daily.      oxyCODONE -acetaminophen  (PERCOCET/ROXICET) 5-325 MG tablet Take 2 tablets by mouth every 6 (six) hours as needed for moderate pain (pain score 4-6).      pantoprazole  (PROTONIX ) 40 MG tablet Take 40 mg by mouth daily.      sertraline  (ZOLOFT ) 100 MG tablet Take 200 mg by mouth daily.      zolpidem  (AMBIEN ) 10 MG tablet Take 10 mg by mouth at bedtime as needed for sleep.       Psychiatric Specialty Exam:  Presentation  General Appearance:  Appropriate for Environment; Casual  Eye Contact: Fair  Speech: Clear and Coherent  Speech Volume: Normal    Mood and Affect  Mood: Anxious; Depressed  Affect: Depressed; Flat   Thought Process  Thought Processes: Coherent  Descriptions of Associations:Intact  Orientation:Full (Time, Place and  Person)  Thought Content:Logical  Hallucinations:Hallucinations: None  Ideas of Reference:None  Suicidal Thoughts:Suicidal Thoughts: No  Homicidal Thoughts:Homicidal Thoughts: No   Sensorium  Memory: Immediate Fair; Recent Fair; Remote  Fair  Judgment: Fair  Insight: Fair   Art therapist  Concentration: Fair  Attention Span: Fair  Recall: [employer] of Knowledge: Fair  Language: Fair   Psychomotor Activity  Psychomotor Activity: Psychomotor Activity: Normal   Assets  Assets: Manufacturing systems engineer; Desire for Improvement; Resilience    Musculoskeletal: Strength & Muscle Tone: within normal limits Gait & Station: normal  Physical Exam: Physical Exam Vitals and nursing note reviewed.  HENT:     Head: Normocephalic.  Cardiovascular:     Rate and Rhythm: Normal rate.  Musculoskeletal:     Cervical back: Normal range of motion.  Neurological:     Mental Status: She is alert.    Review of Systems  Constitutional: Negative.   HENT: Negative.    Eyes: Negative.   Cardiovascular: Negative.   Skin: Negative.    Blood pressure 117/68, pulse 84, temperature 98.4 F (36.9 C), resp. rate 17, height 5' 7 (1.702 m), weight 133.8 kg, SpO2 93%. Body mass index is 46.2 kg/m.  Principal Diagnosis: MDD (major depressive disorder), recurrent episode, severe (HCC) Diagnosis:  Principal Problem:   MDD (major depressive disorder), recurrent episode, severe (HCC)   Clinical Decision Making: Patient with history of depression and anxiety currently admitted to inpatient unit for worsening depression in the context of financial stressors and stress at work leading up to overdose on her medications.  Patient needs to be monitored closely for safety.  Treatment Plan Summary:  Safety and Monitoring:             -- Voluntary admission to inpatient psychiatric unit for safety, stabilization and treatment             -- Daily contact with patient to assess and evaluate symptoms and progress in treatment             -- Patient's case to be discussed in multi-disciplinary team meeting             -- Observation Level: q15 minute checks             -- Vital signs:  q12 hours             -- Precautions:  suicide, elopement, and assault   2. Psychiatric Diagnoses and Treatment:                Zoloft  200 mg Abilfy- increased to 15 mg QHS Klonopin  1mg  PRN   -- The risks/benefits/side-effects/alternatives to this medication were discussed in detail with the patient and time was given for questions. The patient consents to medication trial.                -- Metabolic profile and EKG monitoring obtained while on an atypical antipsychotic (BMI: Lipid Panel: HbgA1c: QTc:)              -- Encouraged patient to participate in unit milieu and in scheduled group therapies                            3. Medical Issues Being Addressed:   No urgent medical needs   4. Discharge Planning:              -- Social work and case management to assist with discharge planning and  identification of hospital follow-up needs prior to discharge             -- Estimated LOS: 5-7 days             -- Discharge Concerns: Need to establish a safety plan; Medication compliance and effectiveness             -- Discharge Goals: Return home with outpatient referrals follow ups  Physician Treatment Plan for Primary Diagnosis: MDD (major depressive disorder), recurrent episode, severe (HCC) Long Term Goal(s): Improvement in symptoms so as ready for discharge  Short Term Goals: Ability to identify changes in lifestyle to reduce recurrence of condition will improve, Ability to verbalize feelings will improve, Ability to disclose and discuss suicidal ideas, Ability to demonstrate self-control will improve, and Ability to identify triggers associated with substance abuse/mental health issues will improve  Physician Treatment Plan for Secondary Diagnosis: Principal Problem:   MDD (major depressive disorder), recurrent episode, severe (HCC)  Long Term Goal(s): Improvement in symptoms so as ready for discharge  Short Term Goals: Ability to identify changes in lifestyle to reduce recurrence of condition will improve, Ability to  verbalize feelings will improve, Ability to disclose and discuss suicidal ideas, Ability to demonstrate self-control will improve, and Ability to identify and develop effective coping behaviors will improve  I certify that inpatient services furnished can reasonably be expected to improve the patient's condition.    Jess Toney, MD 9/1/20258:45 PM

## 2024-07-11 NOTE — BH IP Treatment Plan (Signed)
 Interdisciplinary Treatment and Diagnostic Plan Update  07/11/2024 Time of Session: 11:35 AM  Vanessa Barajas MRN: [id number]  Principal Diagnosis: MDD (major depressive disorder), recurrent episode, severe (HCC)  Secondary Diagnoses: Principal Problem:   MDD (major depressive disorder), recurrent episode, severe (HCC)   Current Medications:  Current Facility-Administered Medications  Medication Dose Route Frequency Provider Last Rate Last Admin   acetaminophen  (TYLENOL ) tablet 650 mg  650 mg Oral Q6H PRN Starkes-Perry, Takia S, FNP       alum & mag hydroxide-simeth (MAALOX/MYLANTA) 200-200-20 MG/5ML suspension 30 mL  30 mL Oral Q4H PRN Starkes-Perry, Takia S, FNP       amLODipine  (NORVASC ) tablet 10 mg  10 mg Oral Daily Wilkie Majel RAMAN, FNP   10 mg at 07/11/24 1011   ARIPiprazole  (ABILIFY ) tablet 10 mg  10 mg Oral Daily Wilkie Majel RAMAN, FNP   10 mg at 07/11/24 1012   calamine lotion 1 Application  1 Application Topical PRN Wilkie Majel RAMAN, FNP       clonazePAM  (KLONOPIN ) tablet 1 mg  1 mg Oral BID PRN Starkes-Perry, Takia S, FNP   1 mg at 07/11/24 1011   empagliflozin  (JARDIANCE ) tablet 25 mg  25 mg Oral Daily Starkes-Perry, Takia S, FNP   25 mg at 07/11/24 1022   fluticasone  (FLONASE ) 50 MCG/ACT nasal spray 2 spray  2 spray Each Nare Daily PRN Starkes-Perry, Takia S, FNP       insulin  aspart (novoLOG ) injection 0-15 Units  0-15 Units Subcutaneous Q4H Wilkie Majel RAMAN, FNP   3 Units at 07/11/24 1153   magnesium  hydroxide (MILK OF MAGNESIA) suspension 30 mL  30 mL Oral Daily PRN Starkes-Perry, Majel RAMAN, FNP       OLANZapine  (ZYPREXA ) injection 5 mg  5 mg Intramuscular TID PRN Starkes-Perry, Majel RAMAN, FNP       OLANZapine  zydis (ZYPREXA ) disintegrating tablet 5 mg  5 mg Oral TID PRN Wilkie Majel RAMAN, FNP       pantoprazole  (PROTONIX ) EC tablet 40 mg  40 mg Oral Daily Wilkie Majel RAMAN, FNP   40 mg at 07/11/24 1011   sertraline  (ZOLOFT ) tablet 200 mg  200 mg  Oral Daily Starkes-Perry, Takia S, FNP   200 mg at 07/11/24 1011   zolpidem  (AMBIEN ) tablet 5 mg  5 mg Oral QHS PRN Starkes-Perry, Takia S, FNP   5 mg at 07/10/24 2122   PTA Medications: Medications Prior to Admission  Medication Sig Dispense Refill Last Dose/Taking   amLODipine  (NORVASC ) 10 MG tablet Take 10 mg by mouth daily.      ARIPiprazole  (ABILIFY ) 10 MG tablet Take 10 mg by mouth daily.      clonazePAM  (KLONOPIN ) 1 MG tablet Take 3 mg by mouth daily.      fluticasone  (FLONASE ) 50 MCG/ACT nasal spray Place 2 sprays into both nostrils daily as needed for allergies or rhinitis.      insulin  glargine (LANTUS) 100 UNIT/ML injection Inject 30 Units into the skin every evening.      JARDIANCE  25 MG TABS tablet Take 25 mg by mouth daily.      oxyCODONE -acetaminophen  (PERCOCET/ROXICET) 5-325 MG tablet Take 2 tablets by mouth every 6 (six) hours as needed for moderate pain (pain score 4-6).      pantoprazole  (PROTONIX ) 40 MG tablet Take 40 mg by mouth daily.      sertraline  (ZOLOFT ) 100 MG tablet Take 200 mg by mouth daily.      zolpidem  (AMBIEN ) 10 MG tablet Take 10  mg by mouth at bedtime as needed for sleep.       Patient Stressors: Health problems    Patient Strengths: Ability for insight   Treatment Modalities: Medication Management, Group therapy, Case management,  1 to 1 session with clinician, Psychoeducation, Recreational therapy.   Physician Treatment Plan for Primary Diagnosis: MDD (major depressive disorder), recurrent episode, severe (HCC) Long Term Goal(s):     Short Term Goals:    Medication Management: Evaluate patient's response, side effects, and tolerance of medication regimen.  Therapeutic Interventions: 1 to 1 sessions, Unit Group sessions and Medication administration.  Evaluation of Outcomes: Not Progressing  Physician Treatment Plan for Secondary Diagnosis: Principal Problem:   MDD (major depressive disorder), recurrent episode, severe (HCC)  Long Term  Goal(s):     Short Term Goals:       Medication Management: Evaluate patient's response, side effects, and tolerance of medication regimen.  Therapeutic Interventions: 1 to 1 sessions, Unit Group sessions and Medication administration.  Evaluation of Outcomes: Not Progressing   RN Treatment Plan for Primary Diagnosis: MDD (major depressive disorder), recurrent episode, severe (HCC) Long Term Goal(s): Knowledge of disease and therapeutic regimen to maintain health will improve  Short Term Goals: Ability to remain free from injury will improve, Ability to verbalize frustration and anger appropriately will improve, Ability to demonstrate self-control, Ability to participate in decision making will improve, Ability to verbalize feelings will improve, Ability to disclose and discuss suicidal ideas, Ability to identify and develop effective coping behaviors will improve, and Compliance with prescribed medications will improve  Medication Management: RN will administer medications as ordered by provider, will assess and evaluate patient's response and provide education to patient for prescribed medication. RN will report any adverse and/or side effects to prescribing provider.  Therapeutic Interventions: 1 on 1 counseling sessions, Psychoeducation, Medication administration, Evaluate responses to treatment, Monitor vital signs and CBGs as ordered, Perform/monitor CIWA, COWS, AIMS and Fall Risk screenings as ordered, Perform wound care treatments as ordered.  Evaluation of Outcomes: Not Progressing   LCSW Treatment Plan for Primary Diagnosis: MDD (major depressive disorder), recurrent episode, severe (HCC) Long Term Goal(s): Safe transition to appropriate next level of care at discharge, Engage patient in therapeutic group addressing interpersonal concerns.  Short Term Goals: Engage patient in aftercare planning with referrals and resources, Increase social support, Increase ability to  appropriately verbalize feelings, Increase emotional regulation, Facilitate acceptance of mental health diagnosis and concerns, Facilitate patient progression through stages of change regarding substance use diagnoses and concerns, Identify triggers associated with mental health/substance abuse issues, and Increase skills for wellness and recovery  Therapeutic Interventions: Assess for all discharge needs, 1 to 1 time with Social worker, Explore available resources and support systems, Assess for adequacy in community support network, Educate family and significant other(s) on suicide prevention, Complete Psychosocial Assessment, Interpersonal group therapy.  Evaluation of Outcomes: Not Progressing   Progress in Treatment: Attending groups: Yes. and No. Participating in groups: Yes. and No. Taking medication as prescribed: Yes. Toleration medication: Yes. Family/Significant other contact made: No, will contact:  CSW will contact if given permission  Patient understands diagnosis: Yes. Discussing patient identified problems/goals with staff: Yes. Medical problems stabilized or resolved: Yes. Denies suicidal/homicidal ideation: Yes. Issues/concerns per patient self-inventory: No. Other: None   New problem(s) identified: No, Describe:  None identified   New Short Term/Long Term Goal(s):  elimination of symptoms of psychosis, medication management for mood stabilization; elimination of SI thoughts; development of comprehensive mental wellness plan.  Patient Goals:  To feel better and to go home  Discharge Plan or Barriers: CSW will assist with appropriate discharge planning   Reason for Continuation of Hospitalization: Depression Medication stabilization  Estimated Length of Stay: 1 to 7 days  Last 3 [place] Suicide Severity Risk Score: Flowsheet Row Admission (Current) from 07/10/2024 in [facility]AV[facility]AL MEDICINE ED to Hosp-Admission (Discharged) from 07/09/2024 in [facility]STEPDOWN  C-SSRS RISK CATEGORY High Risk High Risk    Last PHQ 2/9 Scores:     No data to display          Scribe for Treatment Team: Lum JONETTA Croft, [place]RA[place] 07/11/2024 1:07 PM

## 2024-07-11 NOTE — Plan of Care (Signed)
  Problem: Education: Goal: Emotional status will improve Outcome: Progressing   Problem: Activity: Goal: Interest or engagement in activities will improve Outcome: Progressing Goal: Sleeping patterns will improve Outcome: Progressing   Problem: Coping: Goal: Ability to verbalize frustrations and anger appropriately will improve Outcome: Progressing

## 2024-07-11 NOTE — Group Note (Signed)
 Recreation Therapy Group Note   Group Topic:Coping Skills  Group Date: 07/11/2024 Start Time: 1400 End Time: 1500 Facilitators: Celestia Jeoffrey BRAVO, LRT, CTRS Location: Courtyard  Group Description: Leisure. Patients were given the option to choose from bowling, corn hole, jenga or [employer]. LRT and pts discussed the meaning of leisure, the importance of participating in leisure during their free time/when they're outside of the hospital, as well as how our leisure interests can also serve as coping skills.   Goal Area(s) Addressed:  Patient will identify a current leisure interest.  Patient will learn the definition of "leisure". Patient will practice making a positive decision. Patient will have the opportunity to try a new leisure activity. Patient will communicate with peers and LRT.    Affect/Mood: N/A   Participation Level: Did not attend    Clinical Observations/Individualized Feedback: Patient did not attend group.   Plan: Continue to engage patient in RT group sessions 2-3x/week.   Jeoffrey BRAVO Celestia, LRT, CTRS 07/11/2024 3:58 PM

## 2024-07-11 NOTE — Group Note (Signed)
 Date:  07/11/2024 Time:  10:12 PM  Group Topic/Focus:  Wellness Toolbox:   The focus of this group is to discuss various aspects of wellness, balancing those aspects and exploring ways to increase the ability to experience wellness.  Patients will create a wellness toolbox for use upon discharge. Wrap-Up Group:   The focus of this group is to help patients review their daily goal of treatment and discuss progress on daily workbooks.    Participation Level:  Active  Participation Quality:  Appropriate  Affect:  Appropriate  Cognitive:  Alert  Insight: Appropriate  Engagement in Group:  Engaged  Modes of Intervention:  Discussion  Additional Comments:    Vanessa Barajas Bunker 07/11/2024, 10:12 PM

## 2024-07-11 NOTE — Inpatient Diabetes Management (Addendum)
 Inpatient Diabetes Program Recommendations  AACE/ADA: New Consensus Statement on Inpatient Glycemic Control (2015)  Target Ranges:  Prepandial:   less than 140 mg/dL      Peak postprandial:   less than 180 mg/dL (1-2 hours)      Critically ill patients:  140 - 180 mg/dL    Latest Reference Range & Units 07/10/24 05:36  Hemoglobin A1C 4.8 - 5.6 % 11.4 (H)  280 mg/dl  (H)  Latest Reference Range & Units 07/09/24 23:18 07/10/24 04:03 07/10/24 07:45 07/10/24 11:51 07/10/24 16:42 07/10/24 19:17  Glucose-Capillary 70 - 99 mg/dL 712 (H)  8 units Novolog   293 (H)  8 units Novolog   221 (H)  5 units Novolog   224 (H)  5 units Novolog   232 (H)  5 units Novolog   230 (H)  5 units Novolog    (H): Data is abnormally high: Data is abnormally high  Latest Reference Range & Units 07/11/24 07:43 07/11/24 11:19  Glucose-Capillary 70 - 99 mg/dL 835 (H) 834 (H)  (H): Data is abnormally high   Admit with: Intentional OD of Insulin  (100 units short acting insulin )  History: DM  Home DM Meds: Jardiance  25 mg daily       Lantus 30 units QPM  Current Orders: Novolog  Moderate Correction Scale/ SSI (0-15 units) Q4 hours     Jardiance  25 mg daily    Treated with D50, D10 drip, glucagon , Decadron   Currently getting Novolog  SSI Q4 hours + Home Dose Jardiance   Pt saw her PCP Dr. Darra with [facility] (in [place], [place]) on 05/18/2024 A1c was 11.5% at that visit Pt was taking her Jardiance  but NOT taking her Lantus PCP asked pt to resume Lantus 30 units daily   CBGs well controlled today 09/01  Met w/ pt down in the [facility] unit.  Pt confirmed with me she has restarted her Lantus insulin  at home 30 units daily as directed by her PCP back in early July.  Also taking the Jardiance .  Has CBG meter at home and told me she sees a lot of CBGs in the 200s.  We reviewed her current A1c of 11.4% and how her goal A1c should be closer to 7% and goal CBGs for home: 80-120 pre-meal and <180 for post-meal.  Pt  told me she has old Novolin R insulin  at home--Used to take Novolin R with meals and that is the insulin  she used when she intentionally overdosed.  Discussed with pt that she may need adjustment of her home meds as her A1c has not changed since July when she restarted her Lantus.  Pt open to talking with PCP in October (has appt in Oct).  May need to add back meal time insulin .  Discussed with pt that I have reached out to MD to get her CBG checks changed to TID AC + HS.  Pt appreciative of visit.    --Will follow patient during hospitalization--  Adina Rudolpho Arrow RN, MSN, CDCES Diabetes Coordinator Inpatient Glycemic Control Team Team Pager: [phone] (8a-5p)

## 2024-07-11 NOTE — Progress Notes (Signed)
(  Sleep Hours) - 7.75 (Any PRNs that were needed, meds refused, or side effects to meds)-  ambien  5 mg for sleep (Any disturbances and when (visitation, over night)- none (Concerns raised by the patient)- pt states she takes her Klonopin  differently than it is being prescribed in hospital. Pt encouraged to speak to provider about frequency and dosage. Says she takes it 3 times a day. (SI/HI/AVH)- denies
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
## 2024-07-11 NOTE — Group Note (Signed)
 Date:  07/11/2024 Time:  4:07 PM  Group Topic/Focus:  Goals Group:   The focus of this group is to help patients establish daily goals to achieve during treatment and discuss how the patient can incorporate goal setting into their daily lives to aide in recovery.    Participation Level:  Did Not Attend   Deitra Clap [facility] 07/11/2024, 4:07 PM

## 2024-07-11 NOTE — Progress Notes (Signed)
   07/11/24 1015  Psych Admission Type (Psych Patients Only)  Admission Status Voluntary  Psychosocial Assessment  Patient Complaints Depression;Sadness  Eye Contact Fair  Facial Expression Flat  Affect Flat  Speech Logical/coherent  Interaction Assertive  Motor Activity Slow  Appearance/Hygiene Unremarkable  Behavior Characteristics Cooperative  Mood Depressed  Thought Process  Coherency WDL  Content WDL  Delusions None reported or observed  Perception WDL  Hallucination None reported or observed  Judgment Impaired  Confusion None  Danger to Self  Current suicidal ideation? Denies  Danger to Others  Danger to Others None reported or observed

## 2024-07-11 NOTE — Progress Notes (Signed)
   07/11/24 2000  Psych Admission Type (Psych Patients Only)  Admission Status Voluntary  Psychosocial Assessment  Patient Complaints None  Eye Contact Fair  Facial Expression Flat  Affect Flat  Speech Logical/coherent  Interaction Minimal  Motor Activity Slow  Appearance/Hygiene Unremarkable  Behavior Characteristics Cooperative  Mood Depressed  Thought Process  Coherency WDL  Content WDL  Delusions None reported or observed  Perception WDL  Hallucination None reported or observed  Judgment Impaired  Confusion None  Danger to Self  Current suicidal ideation? Denies  Agreement Not to Harm Self Yes  Description of Agreement verbal  Danger to Others  Danger to Others None reported or observed

## 2024-07-12 LAB — GLUCOSE, CAPILLARY
Glucose-Capillary: 184 mg/dL — ABNORMAL HIGH (ref 70–99)
Glucose-Capillary: 189 mg/dL — ABNORMAL HIGH (ref 70–99)
Glucose-Capillary: 197 mg/dL — ABNORMAL HIGH (ref 70–99)
Glucose-Capillary: 207 mg/dL — ABNORMAL HIGH (ref 70–99)

## 2024-07-12 LAB — HEMOGLOBIN A1C
Hgb A1c MFr Bld: 11.6 % — ABNORMAL HIGH (ref 4.8–5.6)
Mean Plasma Glucose: 286 mg/dL

## 2024-07-12 MED ORDER — CLONAZEPAM 1 MG PO TABS
1.0000 mg | ORAL_TABLET | Freq: Three times a day (TID) | ORAL | Status: DC
  Administered 2024-07-12 – 2024-07-16 (×11): 1 mg via ORAL
  Filled 2024-07-12 (×11): qty 1

## 2024-07-12 NOTE — Progress Notes (Signed)
(  Sleep Hours) -15.25 (Any PRNs that were needed, meds refused, or side effects to meds)- ambien  5 mg for sleep (Any disturbances and when (visitation, over night)-none (Concerns raised by the patient)- none (SI/HI/AVH)- denies

## 2024-07-12 NOTE — Plan of Care (Signed)
   Problem: Education: Goal: Knowledge of [facility] Education information/materials will improve Outcome: Progressing Goal: Emotional status will improve Outcome: Progressing Goal: Mental status will improve Outcome: Progressing Goal: Verbalization of understanding the information provided will improve Outcome: Progressing

## 2024-07-12 NOTE — Progress Notes (Signed)
   07/12/24 1100  Charting Type  Charting Type Shift assessment  Safety Check Verification  Has the RN verified the 15 minute safety check completion? Yes  Neurological  Neuro (WDL) WDL  Glasgow Coma Scale  Eye Opening 4  Best Verbal Response (NON-intubated) 5  Best Motor Response 6  Glasgow Coma Scale Score 15  HEENT  HEENT (WDL) X  Vision Check No  R Eye Reading glasses  L Eye Reading glasses  Lips Symmetrical  Teeth Poor dental hygiene  Tongue Pink;Moist  Mucous Membrane(s) Moist;Pink  Voice Clear  Respiratory  Respiratory Pattern Regular  Respiratory (WDL) WDL  Cardiac  Cardiac (WDL) WDL  Pulse Regular  ECG Monitor No  Vascular  Vascular (WDL) WDL  Integumentary  Integumentary (WDL) WDL  Braden Scale (Ages 8 and up)  Sensory Perceptions 4  Moisture 4  Activity 3  Mobility 4  Nutrition 3  Friction and Shear 3  Braden Scale Score 21  Musculoskeletal  Musculoskeletal (WDL) WDL  Assistive Device None  Generalized Weakness Yes  Gastrointestinal  Gastrointestinal (WDL) WDL  Last BM Date  07/11/24  GU Assessment  Genitourinary (WDL) WDL  Neurological  Level of Consciousness Alert

## 2024-07-12 NOTE — Group Note (Signed)
 Recreation Therapy Group Note   Group Topic:Stress Management  Group Date: 07/12/2024 Start Time: 1100 End Time: 1130 Facilitators: Celestia Jeoffrey BRAVO, LRT, CTRS Location: Courtyard  Group Description: Stress Jenga. LRT and pts played games of Jenga. LRT prompted group discussion on the physical signs and symptoms of stress, similar to the feeling when you're playing Jenga and trying to remove a wooden block from the stack without it all collapsing. LRT and pt discussed the physical and mental signs of stress, as well as coping skills to manage them.   Goal Area(s) Addressed: Patient will identify physical symptoms of stress. Patient will identify coping skills for stress. Patient will build frustration tolerance skills.  Patient will increase communication.    Affect/Mood: N/A   Participation Level: Did not attend    Clinical Observations/Individualized Feedback: Patient did not attend group.   Plan: Continue to engage patient in RT group sessions 2-3x/week.   Jeoffrey BRAVO Celestia, LRT, CTRS 07/12/2024 1:41 PM

## 2024-07-12 NOTE — BHH Suicide Risk Assessment (Signed)
 BHH INPATIENT:  Family/Significant Other Suicide Prevention Education  Suicide Prevention Education:  Patient Refusal for Family/Significant Other Suicide Prevention Education: The patient Vanessa Barajas has refused to provide written consent for family/significant other to be provided Family/Significant Other Suicide Prevention Education during admission and/or prior to discharge.  Physician notified.  Lum JONETTA Croft 07/12/2024, 11:23 AM

## 2024-07-12 NOTE — Group Note (Signed)
 Date:  07/12/2024 Time:  11:37 PM  Group Topic/Focus:  Wrap-Up Group:   The focus of this group is to help patients review their daily goal of treatment and discuss progress on daily workbooks.    Participation Level:  Did Not Attend  Participation Quality:     Affect:     Cognitive:     Insight: None  Engagement in Group:  None  Modes of Intervention:     Additional Comments:    Vanessa Barajas 07/12/2024, 11:37 PM
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
## 2024-07-12 NOTE — Group Note (Signed)
 Recreation Therapy Group Note   Group Topic:Leisure Education  Group Date: 07/12/2024 Start Time: 1500 End Time: 1610 Facilitators: Celestia Jeoffrey BRAVO, LRT, CTRS Location: Courtyard  Group Description: Music. Patients encouraged to name their favorite song(s) for LRT to play song through speaker for group to hear, while in the courtyard getting fresh air and sunlight. Patients educated on the definition of leisure and the importance of having different leisure interests outside of the hospital. Group discussed how leisure activities can often be used as Pharmacologist and that listening to music and being outside are examples.    Goal Area(s) Addressed:  Patient will identify a current leisure interest.  Patient will practice making a positive decision. Patient will have the opportunity to try a new leisure activity.   Affect/Mood: N/A   Participation Level: Did not attend    Clinical Observations/Individualized Feedback: Patient did not attend group.   Plan: Continue to engage patient in RT group sessions 2-3x/week.   Jeoffrey BRAVO Celestia, LRT, CTRS 07/12/2024 4:50 PM

## 2024-07-12 NOTE — Inpatient Diabetes Management (Signed)
 Inpatient Diabetes Program Recommendations  AACE/ADA: New Consensus Statement on Inpatient Glycemic Control   Target Ranges:  Prepandial:   less than 140 mg/dL      Peak postprandial:   less than 180 mg/dL (1-2 hours)      Critically ill patients:  140 - 180 mg/dL    Latest Reference Range & Units 07/11/24 07:43 07/11/24 11:19 07/11/24 16:45 07/11/24 19:35 07/12/24 07:35  Glucose-Capillary 70 - 99 mg/dL 835 (H) 834 (H) 829 (H) 208 (H) 184 (H)    Review of Glycemic Control  Diabetes history: DM2 Outpatient Diabetes medications: Jardiance  25 mg daily, Lantus 30 units QPM Current orders for Inpatient glycemic control: Jardiance  25 mg daily, Novolog  0-15 units TID with meals, Novolog  0-5 units QHS  Inpatient Diabetes Program Recommendations:    Oral DM medication:  Due to intentional overdose of insulin , may want to consider adding additional oral DM medication to see if glucose is controlled on just oral DM medications.  Please consider ordering Glipizide  2.5 mg daily.  Thanks, Earnie Gainer, RN, MSN, CDCES Diabetes Coordinator Inpatient Diabetes Program [phone] (Team Pager from 8am to 5pm)

## 2024-07-12 NOTE — Progress Notes (Addendum)
 [facility] MD Progress Note  07/12/2024 7:40 PM Vanessa Barajas  MRN:  [id number]  Vanessa Barajas is a 53 y.o. female admitted: Medicallyfor 07/09/2024  2:03 AM per Dr. Neda with PMH for DMT2 on insulin , GERD, Osteoarthritis, Knee Pain, HTN, Anxiety, Depression, morbid obesity class III who was BIB EMS with c/o intentional overdose to end her life. Patient is admitted to [facility] unit with Q15 min safety monitoring. Multidisciplinary team approach is offered. Medication management; group/milieu therapy is offered.   Subjective:  Chart reviewed, case discussed in multidisciplinary meeting, patient seen during rounds.  On interview patient is noted to be resting in bed.  She reports improvement in her depression and anxiety.  She denies SI/HI/plan and denies hallucinations.  Patient reports tolerating increased dose of Abilify .  Provider discussed Zoloft  being at the max dose of 200 and possible plan of adding another antidepressant to optimize antidepressant effect.  Patient reports that she has been doing very well on Zoloft  and she likes to leave it like that.  Patient is encouraged to participate in groups and be visible on the unit.   Sleep: Fair  Appetite:  Fair  Past Psychiatric History: see h&P Family History:  Family History  Problem Relation Age of Onset   Leukemia Mother    Cancer Mother 84       leukemia   Hypertension Mother    Cancer Sister    Cancer Maternal Grandmother        breast   Social History:  Social History   Substance and Sexual Activity  Alcohol Use No     Social History   Substance and Sexual Activity  Drug Use No    Social History   Socioeconomic History   Marital status: Divorced    Spouse name: Not on file   Number of children: Not on file   Years of education: Not on file   Highest education level: Not on file  Occupational History   Not on file  Tobacco Use   Smoking status: Former    Current packs/day: 0.00    Types: Cigarettes    Quit date:  09/02/2013    Years since quitting: 10.8   Smokeless tobacco: Never  Vaping Use   Vaping status: Every Day  Substance and Sexual Activity   Alcohol use: No   Drug use: No   Sexual activity: Not Currently    Birth control/protection: Pill  Other Topics Concern   Not on file  Social History Narrative   Not on file   Social Drivers of Health   Financial Resource Strain: Low Risk  (02/02/2024)   Received from [facility]   Overall Financial Resource Strain (CARDIA)    Difficulty of Paying Living Expenses: Not hard at all  Food Insecurity: No Food Insecurity (07/10/2024)   Hunger Vital Sign    Worried About Running Out of Food in the Last Year: Never true    Ran Out of Food in the Last Year: Never true  Recent Concern: Food Insecurity - Food Insecurity Present (07/09/2024)   Hunger Vital Sign    Worried About Running Out of Food in the Last Year: Sometimes true    Ran Out of Food in the Last Year: Sometimes true  Transportation Needs: No Transportation Needs (07/10/2024)   PRAPARE - Administrator, Civil Service (Medical): No    Lack of Transportation (Non-Medical): No  Recent Concern: Transportation Needs - Unmet Transportation Needs (07/09/2024)   PRAPARE - Transportation  Lack of Transportation (Medical): Yes    Lack of Transportation (Non-Medical): Yes  Physical Activity: Inactive (02/02/2024)   Received from [facility]   Exercise Vital Sign    On average, how many days per week do you engage in moderate to strenuous exercise (like a brisk walk)?: 0 days    On average, how many minutes do you engage in exercise at this level?: 30 min  Stress: No Stress Concern Present (02/02/2024)   Received from [facility] - Occupational Stress Questionnaire    Feeling of Stress : Not at all  Social Connections: Socially Integrated (02/02/2024)   Received from [facility]   Social Network    How would you rate your social  network (family, work, friends)?: Good participation with social networks   Past Medical History:  Past Medical History:  Diagnosis Date   Anxiety    Diabetes mellitus without complication (HCC)    Hemorrhoids    Hypertension     Past Surgical History:  Procedure Laterality Date   ACHILLES TENDON SURGERY     ADENOIDECTOMY     ECTOPIC PREGNANCY SURGERY     x2   HERNIA REPAIR     TONSILLECTOMY     TUBAL LIGATION      Current Medications: Current Facility-Administered Medications  Medication Dose Route Frequency Provider Last Rate Last Admin   acetaminophen  (TYLENOL ) tablet 650 mg  650 mg Oral Q6H PRN Starkes-Perry, Takia S, FNP       alum & mag hydroxide-simeth (MAALOX/MYLANTA) 200-200-20 MG/5ML suspension 30 mL  30 mL Oral Q4H PRN Starkes-Perry, Majel RAMAN, FNP       amLODipine  (NORVASC ) tablet 10 mg  10 mg Oral Daily Wilkie Majel RAMAN, FNP   10 mg at 07/12/24 9063   ARIPiprazole  (ABILIFY ) tablet 15 mg  15 mg Oral Daily Coben Godshall, MD   15 mg at 07/12/24 0935   calamine lotion 1 Application  1 Application Topical PRN Starkes-Perry, Majel RAMAN, FNP       clonazePAM  (KLONOPIN ) tablet 1 mg  1 mg Oral BID PRN Starkes-Perry, Takia S, FNP   1 mg at 07/12/24 1543   empagliflozin  (JARDIANCE ) tablet 25 mg  25 mg Oral Daily Starkes-Perry, Takia S, FNP   25 mg at 07/12/24 0935   fluticasone  (FLONASE ) 50 MCG/ACT nasal spray 2 spray  2 spray Each Nare Daily PRN Wilkie Majel RAMAN, FNP       insulin  aspart (novoLOG ) injection 0-15 Units  0-15 Units Subcutaneous TID WC Tyren Dugar, MD   3 Units at 07/12/24 1711   insulin  aspart (novoLOG ) injection 0-5 Units  0-5 Units Subcutaneous QHS Donnelly Mellow, MD   2 Units at 07/11/24 2103   magnesium  hydroxide (MILK OF MAGNESIA) suspension 30 mL  30 mL Oral Daily PRN Wilkie Majel RAMAN, FNP       OLANZapine  (ZYPREXA ) injection 5 mg  5 mg Intramuscular TID PRN Starkes-Perry, Majel RAMAN, FNP       OLANZapine  zydis (ZYPREXA ) disintegrating  tablet 5 mg  5 mg Oral TID PRN Wilkie Majel RAMAN, FNP       pantoprazole  (PROTONIX ) EC tablet 40 mg  40 mg Oral Daily Wilkie Majel RAMAN, FNP   40 mg at 07/12/24 0935   sertraline  (ZOLOFT ) tablet 200 mg  200 mg Oral Daily Starkes-Perry, Takia S, FNP   200 mg at 07/12/24 0935   zolpidem  (AMBIEN ) tablet 5 mg  5 mg Oral QHS PRN Wilkie Majel  S, FNP   5 mg at 07/11/24 2103    Lab Results:  Results for orders placed or performed during the hospital encounter of 07/10/24 (from the past 48 hours)  Glucose, capillary     Status: Abnormal   Collection Time: 07/11/24  7:43 AM  Result Value Ref Range   Glucose-Capillary 164 (H) 70 - 99 mg/dL    Comment: Glucose reference range applies only to samples taken after fasting for at least 8 hours.   Comment 1 Notify RN   Glucose, capillary     Status: Abnormal   Collection Time: 07/11/24 11:19 AM  Result Value Ref Range   Glucose-Capillary 165 (H) 70 - 99 mg/dL    Comment: Glucose reference range applies only to samples taken after fasting for at least 8 hours.  Glucose, capillary     Status: Abnormal   Collection Time: 07/11/24  4:45 PM  Result Value Ref Range   Glucose-Capillary 170 (H) 70 - 99 mg/dL    Comment: Glucose reference range applies only to samples taken after fasting for at least 8 hours.  Glucose, capillary     Status: Abnormal   Collection Time: 07/11/24  7:35 PM  Result Value Ref Range   Glucose-Capillary 208 (H) 70 - 99 mg/dL    Comment: Glucose reference range applies only to samples taken after fasting for at least 8 hours.  Glucose, capillary     Status: Abnormal   Collection Time: 07/12/24  7:35 AM  Result Value Ref Range   Glucose-Capillary 184 (H) 70 - 99 mg/dL    Comment: Glucose reference range applies only to samples taken after fasting for at least 8 hours.  Glucose, capillary     Status: Abnormal   Collection Time: 07/12/24 11:22 AM  Result Value Ref Range   Glucose-Capillary 189 (H) 70 - 99 mg/dL     Comment: Glucose reference range applies only to samples taken after fasting for at least 8 hours.  Glucose, capillary     Status: Abnormal   Collection Time: 07/12/24  4:36 PM  Result Value Ref Range   Glucose-Capillary 197 (H) 70 - 99 mg/dL    Comment: Glucose reference range applies only to samples taken after fasting for at least 8 hours.   Comment 1 Notify RN     Blood Alcohol level:  Lab Results  Component Value Date   [facility] <15 07/09/2024    Metabolic Disorder Labs: Lab Results  Component Value Date   HGBA1C 11.4 (H) 07/10/2024   MPG 280.48 07/10/2024   MPG 286 07/09/2024   No results found for: PROLACTIN Lab Results  Component Value Date   CHOL 165 08/01/2011   TRIG 451 (H) 08/01/2011   HDL 22 (L) 08/01/2011   CHOLHDL 7.5 08/01/2011   VLDL UNABLE TO CALCULATE IF TRIGLYCERIDE OVER 400 mg/dL 90/78/7987   LDLCALC UNABLE TO CALCULATE IF TRIGLYCERIDE OVER 400 mg/dL 90/78/7987    Physical Findings: AIMS:  , ,  ,  ,    CIWA:    COWS:      Psychiatric Specialty Exam:  Presentation  General Appearance:  Appropriate for Environment; Casual  Eye Contact: Fair  Speech: Clear and Coherent  Speech Volume: Normal    Mood and Affect  Mood: Anxious; Depressed  Affect: Depressed; Flat   Thought Process  Thought Processes: Coherent  Descriptions of Associations:Intact  Orientation:Full (Time, Place and Person)  Thought Content:Logical  Hallucinations:Hallucinations: None  Ideas of Reference:None  Suicidal Thoughts:Suicidal Thoughts: No  Homicidal Thoughts:Homicidal Thoughts: No   Sensorium  Memory: Immediate Fair; Recent Fair; Remote Fair  Judgment: Fair  Insight: Fair   Chartered certified accountant: Fair  Attention Span: Fair  Recall: [employer] of Knowledge: Fair  Language: Fair   Psychomotor Activity  Psychomotor Activity: Psychomotor Activity: Normal  Musculoskeletal: Strength & Muscle Tone: within  normal limits Gait & Station: normal Assets  Assets: Manufacturing systems engineer; Desire for Improvement; Resilience    Physical Exam: Physical Exam ROS Blood pressure (!) 108/59, pulse 84, temperature 98.9 F (37.2 C), resp. rate 14, height 5' 7 (1.702 m), weight 133.8 kg, SpO2 96%. Body mass index is 46.2 kg/m.  Diagnosis: Principal Problem:   MDD (major depressive disorder), recurrent episode, severe (HCC) Clinical Decision Making: Patient with history of depression and anxiety currently admitted to inpatient unit for worsening depression in the context of financial stressors and stress at work leading up to overdose on her medications.  Patient needs to be monitored closely for safety.   Treatment Plan Summary:   Safety and Monitoring:             -- Voluntary admission to inpatient psychiatric unit for safety, stabilization and treatment             -- Daily contact with patient to assess and evaluate symptoms and progress in treatment             -- Patient's case to be discussed in multi-disciplinary team meeting             -- Observation Level: q15 minute checks             -- Vital signs:  q12 hours             -- Precautions: suicide, elopement, and assault   2. Psychiatric Diagnoses and Treatment:                Zoloft  200 mg Abilfy- increased to 15 mg QHS Klonopin  changed back to her home dose per PDMP 1 mg TID   -- The risks/benefits/side-effects/alternatives to this medication were discussed in detail with the patient and time was given for questions. The patient consents to medication trial.                -- Metabolic profile and EKG monitoring obtained while on an atypical antipsychotic (BMI: Lipid Panel: HbgA1c: QTc:)              -- Encouraged patient to participate in unit milieu and in scheduled group therapies                            3. Medical Issues Being Addressed:   No urgent medical needs   4. Discharge Planning:   -- Social work and case  management to assist with discharge planning and identification of hospital follow-up needs prior to discharge  -- Estimated LOS: 3-4 days  Dalisha Shively, MD 07/12/2024, 7:40 PM

## 2024-07-12 NOTE — Plan of Care (Signed)
   Problem: Education: Goal: Emotional status will improve Outcome: Progressing Goal: Mental status will improve Outcome: Progressing   Problem: Activity: Goal: Interest or engagement in activities will improve Outcome: Progressing Goal: Sleeping patterns will improve Outcome: Progressing

## 2024-07-13 LAB — LIPID PANEL
Cholesterol: 206 mg/dL — ABNORMAL HIGH (ref 0–200)
HDL: 40 mg/dL — ABNORMAL LOW (ref >40)
LDL Cholesterol: 138 mg/dL — ABNORMAL HIGH (ref 0–99)
Total CHOL/HDL Ratio: 5.2 ratio
Triglycerides: 140 mg/dL (ref <150)
VLDL: 28 mg/dL (ref 0–40)

## 2024-07-13 LAB — GLUCOSE, CAPILLARY
Glucose-Capillary: 218 mg/dL — ABNORMAL HIGH (ref 70–99)
Glucose-Capillary: 206 mg/dL — ABNORMAL HIGH (ref 70–99)
Glucose-Capillary: 174 mg/dL — ABNORMAL HIGH (ref 70–99)
Glucose-Capillary: 230 mg/dL — ABNORMAL HIGH (ref 70–99)

## 2024-07-13 MED ORDER — GLIPIZIDE ER 2.5 MG PO TB24
2.5000 mg | ORAL_TABLET | Freq: Every day | ORAL | Status: DC
  Administered 2024-07-13 – 2024-07-14 (×2): 2.5 mg via ORAL
  Filled 2024-07-13 (×2): qty 1

## 2024-07-13 NOTE — Plan of Care (Signed)
  Problem: Education: Goal: Knowledge of [place] [facility] Education information/materials will improve Outcome: Progressing Goal: Emotional status will improve Outcome: Progressing Goal: Mental status will improve Outcome: Progressing Goal: Verbalization of understanding the information provided will improve Outcome: Progressing   Problem: Activity: Goal: Interest or engagement in activities will improve Outcome: Progressing Goal: Sleeping patterns will improve Outcome: Progressing   Problem: Coping: Goal: Ability to verbalize frustrations and anger appropriately will improve Outcome: Progressing Goal: Ability to demonstrate self-control will improve Outcome: Progressing   Problem: Health Behavior/Discharge Planning: Goal: Identification of resources available to assist in meeting health care needs will improve Outcome: Progressing Goal: Compliance with treatment plan for underlying cause of condition will improve Outcome: Progressing   Problem: Physical Regulation: Goal: Ability to maintain clinical measurements within normal limits will improve Outcome: Progressing   Problem: Safety: Goal: Periods of time without injury will increase Outcome: Progressing   Problem: Education: Goal: Knowledge of General Education information will improve Description: Including pain rating scale, medication(s)/side effects and non-pharmacologic comfort measures Outcome: Progressing   Problem: Health Behavior/Discharge Planning: Goal: Ability to manage health-related needs will improve Outcome: Progressing   Problem: Clinical Measurements: Goal: Ability to maintain clinical measurements within normal limits will improve Outcome: Progressing Goal: Will remain free from infection Outcome: Progressing Goal: Diagnostic test results will improve Outcome: Progressing Goal: Respiratory complications will improve Outcome: Progressing Goal: Cardiovascular complication will be  avoided Outcome: Progressing   Problem: Activity: Goal: Risk for activity intolerance will decrease Outcome: Progressing   Problem: Nutrition: Goal: Adequate nutrition will be maintained Outcome: Progressing   Problem: Coping: Goal: Level of anxiety will decrease Outcome: Progressing   Problem: Elimination: Goal: Will not experience complications related to bowel motility Outcome: Progressing Goal: Will not experience complications related to urinary retention Outcome: Progressing   Problem: Pain Managment: Goal: General experience of comfort will improve and/or be controlled Outcome: Progressing   Problem: Safety: Goal: Ability to remain free from injury will improve Outcome: Progressing   Problem: Skin Integrity: Goal: Risk for impaired skin integrity will decrease Outcome: Progressing   Problem: Education: Goal: Ability to describe self-care measures that may prevent or decrease complications (Diabetes Survival Skills Education) will improve Outcome: Progressing Goal: Individualized Educational Video(s) Outcome: Progressing   Problem: Coping: Goal: Ability to adjust to condition or change in health will improve Outcome: Progressing   Problem: Fluid Volume: Goal: Ability to maintain a balanced intake and output will improve Outcome: Progressing   Problem: Health Behavior/Discharge Planning: Goal: Ability to identify and utilize available resources and services will improve Outcome: Progressing Goal: Ability to manage health-related needs will improve Outcome: Progressing   Problem: Metabolic: Goal: Ability to maintain appropriate glucose levels will improve Outcome: Progressing   Problem: Nutritional: Goal: Maintenance of adequate nutrition will improve Outcome: Progressing Goal: Progress toward achieving an optimal weight will improve Outcome: Progressing   Problem: Skin Integrity: Goal: Risk for impaired skin integrity will decrease Outcome:  Progressing   Problem: Tissue Perfusion: Goal: Adequacy of tissue perfusion will improve Outcome: Progressing

## 2024-07-13 NOTE — Progress Notes (Signed)
 [facility] MD Progress Note  07/13/2024 10:37 PM Vanessa Barajas  MRN:  [id number]  Vanessa Barajas is a 53 y.o. female admitted: Medicallyfor 07/09/2024  2:03 AM per Dr. Neda with PMH for DMT2 on insulin , GERD, Osteoarthritis, Knee Pain, HTN, Anxiety, Depression, morbid obesity class III who was BIB EMS with c/o intentional overdose to end her life. Patient is admitted to [facility] unit with Q15 min safety monitoring. Multidisciplinary team approach is offered. Medication management; group/milieu therapy is offered.   Subjective:  Chart reviewed, case discussed in multidisciplinary meeting, patient seen during rounds.  Patient is noted to be resting in bed.  She reports that she did participate in some groups.  She did acknowledge getting her Klonopin  3 times per day.  She denies having any depression today and reports her anxiety is well under control.  She denies SI/HI/plan and denies hallucinations.  She has fair appetite and sleep.  Sleep: Fair  Appetite:  Fair  Past Psychiatric History: see h&P Family History:  Family History  Problem Relation Age of Onset   Leukemia Mother    Cancer Mother 43       leukemia   Hypertension Mother    Cancer Sister    Cancer Maternal Grandmother        breast   Social History:  Social History   Substance and Sexual Activity  Alcohol Use No     Social History   Substance and Sexual Activity  Drug Use No    Social History   Socioeconomic History   Marital status: Divorced    Spouse name: Not on file   Number of children: Not on file   Years of education: Not on file   Highest education level: Not on file  Occupational History   Not on file  Tobacco Use   Smoking status: Former    Current packs/day: 0.00    Types: Cigarettes    Quit date: 09/02/2013    Years since quitting: 10.8   Smokeless tobacco: Never  Vaping Use   Vaping status: Every Day  Substance and Sexual Activity   Alcohol use: No   Drug use: No   Sexual activity: Not  Currently    Birth control/protection: Pill  Other Topics Concern   Not on file  Social History Narrative   Not on file   Social Drivers of Health   Financial Resource Strain: Low Risk  (02/02/2024)   Received from [facility]   Overall Financial Resource Strain (CARDIA)    Difficulty of Paying Living Expenses: Not hard at all  Food Insecurity: No Food Insecurity (07/10/2024)   Hunger Vital Sign    Worried About Running Out of Food in the Last Year: Never true    Ran Out of Food in the Last Year: Never true  Recent Concern: Food Insecurity - Food Insecurity Present (07/09/2024)   Hunger Vital Sign    Worried About Running Out of Food in the Last Year: Sometimes true    Ran Out of Food in the Last Year: Sometimes true  Transportation Needs: No Transportation Needs (07/10/2024)   PRAPARE - Administrator, Civil Service (Medical): No    Lack of Transportation (Non-Medical): No  Recent Concern: Transportation Needs - Unmet Transportation Needs (07/09/2024)   PRAPARE - Transportation    Lack of Transportation (Medical): Yes    Lack of Transportation (Non-Medical): Yes  Physical Activity: Inactive (02/02/2024)   Received from [facility]   Exercise Vital Sign  On average, how many days per week do you engage in moderate to strenuous exercise (like a brisk walk)?: 0 days    On average, how many minutes do you engage in exercise at this level?: 30 min  Stress: No Stress Concern Present (02/02/2024)   Received from [facility] - Occupational Stress Questionnaire    Feeling of Stress : Not at all  Social Connections: Socially Integrated (02/02/2024)   Received from [facility]   Social Network    How would you rate your social network (family, work, friends)?: Good participation with social networks   Past Medical History:  Past Medical History:  Diagnosis Date   Anxiety    Diabetes mellitus without complication (HCC)     Hemorrhoids    Hypertension     Past Surgical History:  Procedure Laterality Date   ACHILLES TENDON SURGERY     ADENOIDECTOMY     ECTOPIC PREGNANCY SURGERY     x2   HERNIA REPAIR     TONSILLECTOMY     TUBAL LIGATION      Current Medications: Current Facility-Administered Medications  Medication Dose Route Frequency Provider Last Rate Last Admin   acetaminophen  (TYLENOL ) tablet 650 mg  650 mg Oral Q6H PRN Starkes-Perry, Takia S, FNP       alum & mag hydroxide-simeth (MAALOX/MYLANTA) 200-200-20 MG/5ML suspension 30 mL  30 mL Oral Q4H PRN Starkes-Perry, Majel RAMAN, FNP       amLODipine  (NORVASC ) tablet 10 mg  10 mg Oral Daily Wilkie Majel RAMAN, FNP   10 mg at 07/13/24 0855   ARIPiprazole  (ABILIFY ) tablet 15 mg  15 mg Oral Daily Jaziah Kwasnik, MD   15 mg at 07/13/24 0855   calamine lotion 1 Application  1 Application Topical PRN Wilkie Majel RAMAN, FNP       clonazePAM  (KLONOPIN ) tablet 1 mg  1 mg Oral TID Jonell Krontz, MD   1 mg at 07/13/24 2126   empagliflozin  (JARDIANCE ) tablet 25 mg  25 mg Oral Daily Starkes-Perry, Takia S, FNP   25 mg at 07/13/24 9143   fluticasone  (FLONASE ) 50 MCG/ACT nasal spray 2 spray  2 spray Each Nare Daily PRN Wilkie Majel RAMAN, FNP       glipiZIDE  (GLUCOTROL  XL) 24 hr tablet 2.5 mg  2.5 mg Oral Q breakfast Destin Vinsant, MD   2.5 mg at 07/13/24 9144   insulin  aspart (novoLOG ) injection 0-15 Units  0-15 Units Subcutaneous TID WC Bradd Merlos, MD   3 Units at 07/13/24 1704   insulin  aspart (novoLOG ) injection 0-5 Units  0-5 Units Subcutaneous QHS Jaxxson Cavanah, MD   2 Units at 07/13/24 2124   magnesium  hydroxide (MILK OF MAGNESIA) suspension 30 mL  30 mL Oral Daily PRN Wilkie Majel RAMAN, FNP       OLANZapine  (ZYPREXA ) injection 5 mg  5 mg Intramuscular TID PRN Starkes-Perry, Majel RAMAN, FNP       OLANZapine  zydis (ZYPREXA ) disintegrating tablet 5 mg  5 mg Oral TID PRN Wilkie Majel RAMAN, FNP       pantoprazole  (PROTONIX ) EC tablet 40  mg  40 mg Oral Daily Wilkie Majel RAMAN, FNP   40 mg at 07/13/24 9094   sertraline  (ZOLOFT ) tablet 200 mg  200 mg Oral Daily Wilkie Majel RAMAN, FNP   200 mg at 07/13/24 0856   zolpidem  (AMBIEN ) tablet 5 mg  5 mg Oral QHS PRN Wilkie Majel RAMAN, FNP   5 mg at 07/13/24  2126    Lab Results:  Results for orders placed or performed during the hospital encounter of 07/10/24 (from the past 48 hours)  Glucose, capillary     Status: Abnormal   Collection Time: 07/12/24  7:35 AM  Result Value Ref Range   Glucose-Capillary 184 (H) 70 - 99 mg/dL    Comment: Glucose reference range applies only to samples taken after fasting for at least 8 hours.  Glucose, capillary     Status: Abnormal   Collection Time: 07/12/24 11:22 AM  Result Value Ref Range   Glucose-Capillary 189 (H) 70 - 99 mg/dL    Comment: Glucose reference range applies only to samples taken after fasting for at least 8 hours.  Glucose, capillary     Status: Abnormal   Collection Time: 07/12/24  4:36 PM  Result Value Ref Range   Glucose-Capillary 197 (H) 70 - 99 mg/dL    Comment: Glucose reference range applies only to samples taken after fasting for at least 8 hours.   Comment 1 Notify RN   Glucose, capillary     Status: Abnormal   Collection Time: 07/12/24  7:53 PM  Result Value Ref Range   Glucose-Capillary 207 (H) 70 - 99 mg/dL    Comment: Glucose reference range applies only to samples taken after fasting for at least 8 hours.  Lipid panel     Status: Abnormal   Collection Time: 07/13/24  6:59 AM  Result Value Ref Range   Cholesterol 206 (H) 0 - 200 mg/dL   Triglycerides 859 <849 mg/dL   HDL 40 (L) >59 mg/dL   Total CHOL/HDL Ratio 5.2 RATIO   VLDL 28 0 - 40 mg/dL   LDL Cholesterol 861 (H) 0 - 99 mg/dL    Comment:        Total Cholesterol/HDL:CHD Risk Coronary Heart Disease Risk Table                     Men   Women  1/2 Average Risk   3.4   3.3  Average Risk       5.0   4.4  2 X Average Risk   9.6   7.1  3  X Average Risk  23.4   11.0        Use the calculated Patient Ratio above and the CHD Risk Table to determine the patient's CHD Risk.        ATP III CLASSIFICATION (LDL):  <100     mg/dL   Optimal  899-870  mg/dL   Near or Above                    Optimal  130-159  mg/dL   Borderline  839-810  mg/dL   High  >809     mg/dL   Very High Performed at [facility], [address] [place], [place] [address]   Glucose, capillary     Status: Abnormal   Collection Time: 07/13/24  7:18 AM  Result Value Ref Range   Glucose-Capillary 218 (H) 70 - 99 mg/dL    Comment: Glucose reference range applies only to samples taken after fasting for at least 8 hours.  Glucose, capillary     Status: Abnormal   Collection Time: 07/13/24 11:20 AM  Result Value Ref Range   Glucose-Capillary 206 (H) 70 - 99 mg/dL    Comment: Glucose reference range applies only to samples taken after fasting for at least 8 hours.  Glucose,  capillary     Status: Abnormal   Collection Time: 07/13/24  4:10 PM  Result Value Ref Range   Glucose-Capillary 174 (H) 70 - 99 mg/dL    Comment: Glucose reference range applies only to samples taken after fasting for at least 8 hours.  Glucose, capillary     Status: Abnormal   Collection Time: 07/13/24  7:35 PM  Result Value Ref Range   Glucose-Capillary 230 (H) 70 - 99 mg/dL    Comment: Glucose reference range applies only to samples taken after fasting for at least 8 hours.    Blood Alcohol level:  Lab Results  Component Value Date   [facility] <15 07/09/2024    Metabolic Disorder Labs: Lab Results  Component Value Date   HGBA1C 11.4 (H) 07/10/2024   MPG 280.48 07/10/2024   MPG 286 07/09/2024   No results found for: PROLACTIN Lab Results  Component Value Date   CHOL 206 (H) 07/13/2024   TRIG 140 07/13/2024   HDL 40 (L) 07/13/2024   CHOLHDL 5.2 07/13/2024   VLDL 28 07/13/2024   LDLCALC 138 (H) 07/13/2024   LDLCALC UNABLE TO CALCULATE IF TRIGLYCERIDE OVER 400  mg/dL 90/78/7987    Physical Findings: AIMS:  , ,  ,  ,    CIWA:    COWS:      Psychiatric Specialty Exam:  Presentation  General Appearance:  Appropriate for Environment; Casual  Eye Contact: Fair  Speech: Clear and Coherent  Speech Volume: Normal    Mood and Affect  Mood: Anxious; Depressed  Affect: Depressed; Flat   Thought Process  Thought Processes: Coherent  Descriptions of Associations:Intact  Orientation:Full (Time, Place and Person)  Thought Content:Logical  Hallucinations:No data recorded  Ideas of Reference:None  Suicidal Thoughts:No data recorded  Homicidal Thoughts:No data recorded   Sensorium  Memory: Immediate Fair; Recent Fair; Remote Fair  Judgment: Fair  Insight: Fair   Art therapist  Concentration: Fair  Attention Span: Fair  Recall: [employer] of Knowledge: Fair  Language: Fair   Psychomotor Activity  Psychomotor Activity: No data recorded  Musculoskeletal: Strength & Muscle Tone: within normal limits Gait & Station: normal Assets  Assets: Manufacturing systems engineer; Desire for Improvement; Resilience    Physical Exam: Physical Exam ROS Blood pressure 123/74, pulse 85, temperature 98.1 F (36.7 C), resp. rate 19, height 5' 7 (1.702 m), weight 133.8 kg, SpO2 94%. Body mass index is 46.2 kg/m.  Diagnosis: Principal Problem:   MDD (major depressive disorder), recurrent episode, severe (HCC) Clinical Decision Making: Patient with history of depression and anxiety currently admitted to inpatient unit for worsening depression in the context of financial stressors and stress at work leading up to overdose on her medications.  Patient needs to be monitored closely for safety.   Treatment Plan Summary:   Safety and Monitoring:             -- Voluntary admission to inpatient psychiatric unit for safety, stabilization and treatment             -- Daily contact with patient to assess and evaluate  symptoms and progress in treatment             -- Patient's case to be discussed in multi-disciplinary team meeting             -- Observation Level: q15 minute checks             -- Vital signs:  q12 hours             --  Precautions: suicide, elopement, and assault   2. Psychiatric Diagnoses and Treatment:                Zoloft  200 mg Abilfy- increased to 15 mg QHS Klonopin  changed back to her home dose per PDMP 1 mg TID   -- The risks/benefits/side-effects/alternatives to this medication were discussed in detail with the patient and time was given for questions. The patient consents to medication trial.                -- Metabolic profile and EKG monitoring obtained while on an atypical antipsychotic (BMI: Lipid Panel: HbgA1c: QTc:)              -- Encouraged patient to participate in unit milieu and in scheduled group therapies                            3. Medical Issues Being Addressed:   No urgent medical needs   4. Discharge Planning:   -- Social work and case management to assist with discharge planning and identification of hospital follow-up needs prior to discharge  -- Estimated LOS: 3-4 days  Lorely Bubb, MD 07/13/2024, 10:37 PM

## 2024-07-13 NOTE — Plan of Care (Signed)
   Problem: Education: Goal: Knowledge of [facility] Education information/materials will improve Outcome: Progressing Goal: Emotional status will improve Outcome: Progressing Goal: Mental status will improve Outcome: Progressing Goal: Verbalization of understanding the information provided will improve Outcome: Progressing

## 2024-07-13 NOTE — [facility] Group Notes (Signed)
 Spirituality Group   Group Goal: Support / Education around grief and loss    Group Description: Following introductions and group rules, group members engaged in facilitated group dialog and support around topic of loss, with particular support around experiences of loss in their lives. Group members identified types of loss (relationships / self / things) as well as patterns, circumstances, and changes that precipitate loss. Reflection invited on thoughts / feelings around loss, normalized grief responses, and recognized variety in grief experience. Group noted Worden's four tasks of grief in discussion. Group drew on Adlerian / Rogerian, narrative, MI, with Yalom's group therapy as a primary framework.   Observations: Vanessa Barajas was present for the full group. She was quiet and reserved but seemed to be passively engaged in the group discussion.  Julien Oscar L. Delores HERO.Div

## 2024-07-13 NOTE — Inpatient Diabetes Management (Signed)
 Inpatient Diabetes Program Recommendations  AACE/ADA: New Consensus Statement on Inpatient Glycemic Control   Target Ranges:  Prepandial:   less than 140 mg/dL      Peak postprandial:   less than 180 mg/dL (1-2 hours)      Critically ill patients:  140 - 180 mg/dL    Latest Reference Range & Units 07/12/24 07:35 07/12/24 11:22 07/12/24 16:36 07/12/24 19:53 07/13/24 07:18  Glucose-Capillary 70 - 99 mg/dL 815 (H) 810 (H) 802 (H) 207 (H) 218 (H)   Review of Glycemic Control  Diabetes history: DM2 Outpatient Diabetes medications: Jardiance  25 mg daily, Lantus 30 units QPM Current orders for Inpatient glycemic control: Jardiance  25 mg daily, Novolog  0-15 units TID with meals, Novolog  0-5 units QHS   Inpatient Diabetes Program Recommendations:     Oral DM medication:  Due to intentional overdose of insulin , may want to consider adding additional oral DM medication to see if glucose is controlled on just oral DM medications.  Please consider ordering Glipizide  2.5 mg daily.   Thanks, Earnie Gainer, RN, MSN, CDCES Diabetes Coordinator Inpatient Diabetes Program [phone] (Team Pager from 8am to 5pm)

## 2024-07-13 NOTE — Progress Notes (Signed)
   07/13/24 1000  Psych Admission Type (Psych Patients Only)  Admission Status Voluntary  Psychosocial Assessment  Patient Complaints None  Eye Contact Fair  Facial Expression Flat  Affect Flat  Speech Logical/coherent  Interaction Minimal;Isolative  Motor Activity Slow  Appearance/Hygiene Unremarkable  Behavior Characteristics Cooperative  Mood Depressed  Thought Process  Coherency WDL  Content WDL  Delusions None reported or observed  Perception WDL  Hallucination None reported or observed  Judgment Impaired  Confusion None  Danger to Self  Current suicidal ideation? Denies  Agreement Not to Harm Self Yes  Description of Agreement verbal  Danger to Others  Danger to Others None reported or observed

## 2024-07-13 NOTE — Progress Notes (Signed)
   07/12/24 2100  Psych Admission Type (Psych Patients Only)  Admission Status Voluntary  Psychosocial Assessment  Patient Complaints None  Eye Contact Fair  Facial Expression Flat  Affect Flat  Speech Logical/coherent  Interaction Minimal;Isolative  Motor Activity Slow  Appearance/Hygiene Unremarkable  Behavior Characteristics Cooperative;Appropriate to situation  Mood Depressed  Thought Process  Coherency WDL  Content WDL  Delusions None reported or observed  Perception WDL  Hallucination None reported or observed  Judgment Impaired  Confusion None  Danger to Self  Current suicidal ideation? Denies  Self-Injurious Behavior No self-injurious ideation or behavior indicators observed or expressed   Agreement Not to Harm Self Yes  Description of Agreement verbal  Danger to Others  Danger to Others None reported or observed

## 2024-07-13 NOTE — Group Note (Signed)
 Date:  07/13/2024 Time:  11:21 AM  Group Topic/Focus:  Personal Choices and Values:   The focus of this group is to help patients assess and explore the importance of values in their lives, how their values affect their decisions, how they express their values and what opposes their expression.    Participation Level:  Did Not Attend   Harlene LITTIE Gavel 07/13/2024, 11:21 AM

## 2024-07-14 LAB — GLUCOSE, CAPILLARY
Glucose-Capillary: 195 mg/dL — ABNORMAL HIGH (ref 70–99)
Glucose-Capillary: 196 mg/dL — ABNORMAL HIGH (ref 70–99)
Glucose-Capillary: 179 mg/dL — ABNORMAL HIGH (ref 70–99)
Glucose-Capillary: 222 mg/dL — ABNORMAL HIGH (ref 70–99)

## 2024-07-14 LAB — HEMOGLOBIN A1C
Hgb A1c MFr Bld: 11.5 % — ABNORMAL HIGH (ref 4.8–5.6)
Mean Plasma Glucose: 283 mg/dL

## 2024-07-14 MED ORDER — GLIPIZIDE ER 2.5 MG PO TB24
2.5000 mg | ORAL_TABLET | Freq: Once | ORAL | Status: AC
  Administered 2024-07-14: 2.5 mg via ORAL
  Filled 2024-07-14: qty 1

## 2024-07-14 MED ORDER — GLIPIZIDE ER 5 MG PO TB24
5.0000 mg | ORAL_TABLET | Freq: Every day | ORAL | Status: DC
  Administered 2024-07-15 – 2024-07-16 (×2): 5 mg via ORAL
  Filled 2024-07-14 (×2): qty 1

## 2024-07-14 NOTE — Group Note (Signed)
 LCSW Group Therapy Note  Group Date: 07/14/2024 Start Time: 1300 End Time: 1345   Type of Therapy and Topic:  Group Therapy: Positive Affirmations  Participation Level:  Did Not Attend   Description of Group:   This group addressed positive affirmation towards self and others.  Patients went around the room and identified two positive things about themselves and two positive things about a peer in the room.  Patients reflected on how it felt to share something positive with others, to identify positive things about themselves, and to hear positive things from others/ Patients were encouraged to have a daily reflection of positive characteristics or circumstances.   Therapeutic Goals: Patients will verbalize two of their positive qualities Patients will demonstrate empathy for others by stating two positive qualities about a peer in the group Patients will verbalize their feelings when voicing positive self affirmations and when voicing positive affirmations of others Patients will discuss the potential positive impact on their wellness/recovery of focusing on positive traits of self and others.  Summary of Patient Progress: X  Therapeutic Modalities:   Cognitive Behavioral Therapy Motivational Interviewing    Lum JONETTA Croft, [place] 07/14/2024  2:16 PM

## 2024-07-14 NOTE — Group Note (Signed)
 Date:  07/14/2024 Time:  11:18 AM  Group Topic/Focus:  Fresh air Therapy with music and cards    Participation Level:  Active  Participation Quality:  Appropriate  Affect:  Appropriate  Cognitive:  Appropriate  Insight: Appropriate  Engagement in Group:  Engaged  Modes of Intervention:  Activity and Socialization  Additional Comments:  none  Norleen SHAUNNA Bias 07/14/2024, 11:18 AM

## 2024-07-14 NOTE — Progress Notes (Signed)
   07/14/24 2154  Psych Admission Type (Psych Patients Only)  Admission Status Voluntary  Psychosocial Assessment  Patient Complaints None  Eye Contact Fair  Facial Expression Flat  Affect Appropriate to circumstance  Speech Logical/coherent  Interaction Assertive  Motor Activity Slow  Appearance/Hygiene Unremarkable  Behavior Characteristics Cooperative;Appropriate to situation  Mood Pleasant  Thought Process  Coherency WDL  Content WDL  Delusions None reported or observed  Perception WDL  Hallucination None reported or observed  Judgment Impaired  Confusion None  Danger to Self  Current suicidal ideation? Denies  Self-Injurious Behavior No self-injurious ideation or behavior indicators observed or expressed   Agreement Not to Harm Self Yes  Description of Agreement verbal  Danger to Others  Danger to Others None reported or observed

## 2024-07-14 NOTE — Inpatient Diabetes Management (Signed)
 Inpatient Diabetes Program Recommendations  AACE/ADA: New Consensus Statement on Inpatient Glycemic Control   Target Ranges:  Prepandial:   less than 140 mg/dL      Peak postprandial:   less than 180 mg/dL (1-2 hours)      Critically ill patients:  140 - 180 mg/dL    Latest Reference Range & Units 07/13/24 07:18 07/13/24 11:20 07/13/24 16:10 07/13/24 19:35 07/14/24 07:19  Glucose-Capillary 70 - 99 mg/dL 781 (H) 793 (H) 825 (H) 230 (H) 195 (H)   Review of Glycemic Control  Diabetes history: DM2 Outpatient Diabetes medications: Jardiance  25 mg daily, Lantus 30 units QPM Current orders for Inpatient glycemic control: Jardiance  25 mg daily, Novolog  0-15 units TID with meals, Novolog  0-5 units at bedtime, and Glipizide  2.5mg .    Inpatient Diabetes Program Recommendations:     Oral DM medication: Due to intentional overdose of insulin , would consider controlling glucose with oral DM medication if possible. CBG 194 this morning. Please consider increasing Glipizide  to 5 mg daily.  Thanks,  Lavanda Search, RN, MSN, [facility]  Inpatient Diabetes Coordinator  Pager [phone] (8a-5p)

## 2024-07-14 NOTE — Progress Notes (Signed)
   07/13/24 2100  Psych Admission Type (Psych Patients Only)  Admission Status Voluntary  Psychosocial Assessment  Patient Complaints None  Eye Contact Fair  Facial Expression Flat  Affect Flat  Speech Logical/coherent  Interaction Minimal;Isolative  Motor Activity Slow  Appearance/Hygiene Unremarkable  Behavior Characteristics Cooperative;Appropriate to situation  Mood Depressed  Thought Process  Coherency WDL  Content WDL  Delusions None reported or observed  Perception WDL  Hallucination None reported or observed  Judgment Impaired  Confusion None  Danger to Self  Current suicidal ideation? Denies  Self-Injurious Behavior No self-injurious ideation or behavior indicators observed or expressed   Agreement Not to Harm Self Yes  Description of Agreement verbal  Danger to Others  Danger to Others None reported or observed

## 2024-07-14 NOTE — Plan of Care (Signed)
  Problem: Education: Goal: Emotional status will improve Outcome: Progressing   Problem: Activity: Goal: Interest or engagement in activities will improve Outcome: Progressing   Problem: Coping: Goal: Ability to demonstrate self-control will improve Outcome: Progressing

## 2024-07-14 NOTE — Progress Notes (Signed)
 [facility] MD Progress Note  07/14/2024 10:16 PM Vanessa Barajas  MRN:  [id number]  Vanessa Barajas is a 53 y.o. female admitted: Medicallyfor 07/09/2024  2:03 AM per Dr. Neda with PMH for DMT2 on insulin , GERD, Osteoarthritis, Knee Pain, HTN, Anxiety, Depression, morbid obesity class III who was BIB EMS with c/o intentional overdose to end her life. Patient is admitted to [facility] unit with Q15 min safety monitoring. Multidisciplinary team approach is offered. Medication management; group/milieu therapy is offered.   Subjective:  Chart reviewed, case discussed in multidisciplinary meeting, patient seen during rounds.  Patient is noted to be resting in bed.  She reports feeling better and denies having any depression or anxiety.  Patient is encouraged to go out and participate in the day area.  Patient reports that she has been going to some groups.  Patient denies SI/HI/plan and denies hallucinations.  She is taking her medications with no reported side effects.  Patient is also recommended and educated to look into some volunteer programs in the community to get out of the house and stay active. Sleep: Fair  Appetite:  Fair  Past Psychiatric History: see h&P Family History:  Family History  Problem Relation Age of Onset   Leukemia Mother    Cancer Mother 77       leukemia   Hypertension Mother    Cancer Sister    Cancer Maternal Grandmother        breast   Social History:  Social History   Substance and Sexual Activity  Alcohol Use No     Social History   Substance and Sexual Activity  Drug Use No    Social History   Socioeconomic History   Marital status: Divorced    Spouse name: Not on file   Number of children: Not on file   Years of education: Not on file   Highest education level: Not on file  Occupational History   Not on file  Tobacco Use   Smoking status: Former    Current packs/day: 0.00    Types: Cigarettes    Quit date: 09/02/2013    Years since quitting: 10.8    Smokeless tobacco: Never  Vaping Use   Vaping status: Every Day  Substance and Sexual Activity   Alcohol use: No   Drug use: No   Sexual activity: Not Currently    Birth control/protection: Pill  Other Topics Concern   Not on file  Social History Narrative   Not on file   Social Drivers of Health   Financial Resource Strain: Low Risk  (02/02/2024)   Received from [facility]   Overall Financial Resource Strain (CARDIA)    Difficulty of Paying Living Expenses: Not hard at all  Food Insecurity: No Food Insecurity (07/10/2024)   Hunger Vital Sign    Worried About Running Out of Food in the Last Year: Never true    Ran Out of Food in the Last Year: Never true  Recent Concern: Food Insecurity - Food Insecurity Present (07/09/2024)   Hunger Vital Sign    Worried About Running Out of Food in the Last Year: Sometimes true    Ran Out of Food in the Last Year: Sometimes true  Transportation Needs: No Transportation Needs (07/10/2024)   PRAPARE - Administrator, Civil Service (Medical): No    Lack of Transportation (Non-Medical): No  Recent Concern: Transportation Needs - Unmet Transportation Needs (07/09/2024)   PRAPARE - Transportation    Lack of Transportation (  Medical): Yes    Lack of Transportation (Non-Medical): Yes  Physical Activity: Inactive (02/02/2024)   Received from [facility]   Exercise Vital Sign    On average, how many days per week do you engage in moderate to strenuous exercise (like a brisk walk)?: 0 days    On average, how many minutes do you engage in exercise at this level?: 30 min  Stress: No Stress Concern Present (02/02/2024)   Received from [facility] - Occupational Stress Questionnaire    Feeling of Stress : Not at all  Social Connections: Socially Integrated (02/02/2024)   Received from [facility]   Social Network    How would you rate your social network (family, work, friends)?: Good  participation with social networks   Past Medical History:  Past Medical History:  Diagnosis Date   Anxiety    Diabetes mellitus without complication (HCC)    Hemorrhoids    Hypertension     Past Surgical History:  Procedure Laterality Date   ACHILLES TENDON SURGERY     ADENOIDECTOMY     ECTOPIC PREGNANCY SURGERY     x2   HERNIA REPAIR     TONSILLECTOMY     TUBAL LIGATION      Current Medications: Current Facility-Administered Medications  Medication Dose Route Frequency Provider Last Rate Last Admin   acetaminophen  (TYLENOL ) tablet 650 mg  650 mg Oral Q6H PRN Starkes-Perry, Takia S, FNP       alum & mag hydroxide-simeth (MAALOX/MYLANTA) 200-200-20 MG/5ML suspension 30 mL  30 mL Oral Q4H PRN Starkes-Perry, Majel RAMAN, FNP       amLODipine  (NORVASC ) tablet 10 mg  10 mg Oral Daily Wilkie Majel RAMAN, FNP   10 mg at 07/14/24 9078   ARIPiprazole  (ABILIFY ) tablet 15 mg  15 mg Oral Daily Dajuan Turnley, MD   15 mg at 07/14/24 0921   calamine lotion 1 Application  1 Application Topical PRN Starkes-Perry, Majel RAMAN, FNP       clonazePAM  (KLONOPIN ) tablet 1 mg  1 mg Oral TID Betzayda Braxton, MD   1 mg at 07/14/24 2153   empagliflozin  (JARDIANCE ) tablet 25 mg  25 mg Oral Daily Starkes-Perry, Takia S, FNP   25 mg at 07/14/24 9078   fluticasone  (FLONASE ) 50 MCG/ACT nasal spray 2 spray  2 spray Each Nare Daily PRN Wilkie Majel RAMAN, FNP       [START ON 07/15/2024] glipiZIDE  (GLUCOTROL  XL) 24 hr tablet 5 mg  5 mg Oral Q breakfast Nuha Degner, MD       insulin  aspart (novoLOG ) injection 0-15 Units  0-15 Units Subcutaneous TID WC Kina Shiffman, MD   3 Units at 07/14/24 1718   insulin  aspart (novoLOG ) injection 0-5 Units  0-5 Units Subcutaneous QHS Chibuikem Thang, MD   2 Units at 07/14/24 2150   magnesium  hydroxide (MILK OF MAGNESIA) suspension 30 mL  30 mL Oral Daily PRN Starkes-Perry, Majel RAMAN, FNP       OLANZapine  (ZYPREXA ) injection 5 mg  5 mg Intramuscular TID PRN Starkes-Perry,  Majel RAMAN, FNP       OLANZapine  zydis (ZYPREXA ) disintegrating tablet 5 mg  5 mg Oral TID PRN Wilkie Majel RAMAN, FNP       pantoprazole  (PROTONIX ) EC tablet 40 mg  40 mg Oral Daily Wilkie Majel RAMAN, FNP   40 mg at 07/14/24 9078   sertraline  (ZOLOFT ) tablet 200 mg  200 mg Oral Daily Wilkie Majel RAMAN, FNP  200 mg at 07/14/24 9078   zolpidem  (AMBIEN ) tablet 5 mg  5 mg Oral QHS PRN Wilkie Majel RAMAN, FNP   5 mg at 07/14/24 2153    Lab Results:  Results for orders placed or performed during the hospital encounter of 07/10/24 (from the past 48 hours)  Hemoglobin A1c     Status: Abnormal   Collection Time: 07/13/24  6:59 AM  Result Value Ref Range   Hgb A1c MFr Bld 11.5 (H) 4.8 - 5.6 %    Comment: (NOTE)         Prediabetes: 5.7 - 6.4         Diabetes: >6.4         Glycemic control for adults with diabetes: <7.0    Mean Plasma Glucose 283 mg/dL    Comment: (NOTE) Performed At: [facility] [address] [place], [place] [id number] Jennette Shorter MD Ey:[id number]   Lipid panel     Status: Abnormal   Collection Time: 07/13/24  6:59 AM  Result Value Ref Range   Cholesterol 206 (H) 0 - 200 mg/dL   Triglycerides 859 <849 mg/dL   HDL 40 (L) >59 mg/dL   Total CHOL/HDL Ratio 5.2 RATIO   VLDL 28 0 - 40 mg/dL   LDL Cholesterol 861 (H) 0 - 99 mg/dL    Comment:        Total Cholesterol/HDL:CHD Risk Coronary Heart Disease Risk Table                     Men   Women  1/2 Average Risk   3.4   3.3  Average Risk       5.0   4.4  2 X Average Risk   9.6   7.1  3 X Average Risk  23.4   11.0        Use the calculated Patient Ratio above and the CHD Risk Table to determine the patient's CHD Risk.        ATP III CLASSIFICATION (LDL):  <100     mg/dL   Optimal  899-870  mg/dL   Near or Above                    Optimal  130-159  mg/dL   Borderline  839-810  mg/dL   High  >809     mg/dL   Very High Performed at [facility], [address] [place],  [place] [address]   Glucose, capillary     Status: Abnormal   Collection Time: 07/13/24  7:18 AM  Result Value Ref Range   Glucose-Capillary 218 (H) 70 - 99 mg/dL    Comment: Glucose reference range applies only to samples taken after fasting for at least 8 hours.  Glucose, capillary     Status: Abnormal   Collection Time: 07/13/24 11:20 AM  Result Value Ref Range   Glucose-Capillary 206 (H) 70 - 99 mg/dL    Comment: Glucose reference range applies only to samples taken after fasting for at least 8 hours.  Glucose, capillary     Status: Abnormal   Collection Time: 07/13/24  4:10 PM  Result Value Ref Range   Glucose-Capillary 174 (H) 70 - 99 mg/dL    Comment: Glucose reference range applies only to samples taken after fasting for at least 8 hours.  Glucose, capillary     Status: Abnormal   Collection Time: 07/13/24  7:35 PM  Result Value Ref Range   Glucose-Capillary  230 (H) 70 - 99 mg/dL    Comment: Glucose reference range applies only to samples taken after fasting for at least 8 hours.  Glucose, capillary     Status: Abnormal   Collection Time: 07/14/24  7:19 AM  Result Value Ref Range   Glucose-Capillary 195 (H) 70 - 99 mg/dL    Comment: Glucose reference range applies only to samples taken after fasting for at least 8 hours.  Glucose, capillary     Status: Abnormal   Collection Time: 07/14/24 11:36 AM  Result Value Ref Range   Glucose-Capillary 196 (H) 70 - 99 mg/dL    Comment: Glucose reference range applies only to samples taken after fasting for at least 8 hours.  Glucose, capillary     Status: Abnormal   Collection Time: 07/14/24  4:37 PM  Result Value Ref Range   Glucose-Capillary 179 (H) 70 - 99 mg/dL    Comment: Glucose reference range applies only to samples taken after fasting for at least 8 hours.  Glucose, capillary     Status: Abnormal   Collection Time: 07/14/24  8:02 PM  Result Value Ref Range   Glucose-Capillary 222 (H) 70 - 99 mg/dL    Comment: Glucose reference  range applies only to samples taken after fasting for at least 8 hours.    Blood Alcohol level:  Lab Results  Component Value Date   [facility] <15 07/09/2024    Metabolic Disorder Labs: Lab Results  Component Value Date   HGBA1C 11.5 (H) 07/13/2024   MPG 283 07/13/2024   MPG 280.48 07/10/2024   No results found for: PROLACTIN Lab Results  Component Value Date   CHOL 206 (H) 07/13/2024   TRIG 140 07/13/2024   HDL 40 (L) 07/13/2024   CHOLHDL 5.2 07/13/2024   VLDL 28 07/13/2024   LDLCALC 138 (H) 07/13/2024   LDLCALC UNABLE TO CALCULATE IF TRIGLYCERIDE OVER 400 mg/dL 90/78/7987    Physical Findings: AIMS:  , ,  ,  ,    CIWA:    COWS:      Psychiatric Specialty Exam:  Presentation  General Appearance:  Appropriate for Environment; Casual  Eye Contact: Fair  Speech: Clear and Coherent  Speech Volume: Normal    Mood and Affect  Mood: Anxious; Depressed  Affect: Depressed; Flat   Thought Process  Thought Processes: Coherent  Descriptions of Associations:Intact  Orientation:Full (Time, Place and Person)  Thought Content:Logical  Hallucinations: Denies  Ideas of Reference:None  Suicidal Thoughts: Denies  Homicidal Thoughts: Denies   Sensorium  Memory: Immediate Fair; Recent Fair; Remote Fair  Judgment: Fair  Insight: Fair   Art therapist  Concentration: Fair  Attention Span: Fair  Recall: [employer] of Knowledge: Fair  Language: Fair   Psychomotor Activity  Psychomotor Activity: No data recorded  Musculoskeletal: Strength & Muscle Tone: within normal limits Gait & Station: normal Assets  Assets: Manufacturing systems engineer; Desire for Improvement; Resilience    Physical Exam: Physical Exam ROS Blood pressure (!) 140/75, pulse 89, temperature 98.3 F (36.8 C), resp. rate 18, height 5' 7 (1.702 m), weight 133.8 kg, SpO2 95%. Body mass index is 46.2 kg/m.  Diagnosis: Principal Problem:   MDD (major  depressive disorder), recurrent episode, severe (HCC) Clinical Decision Making: Patient with history of depression and anxiety currently admitted to inpatient unit for worsening depression in the context of financial stressors and stress at work leading up to overdose on her medications.  Patient needs to be monitored closely for safety.  Treatment Plan Summary:   Safety and Monitoring:             -- Voluntary admission to inpatient psychiatric unit for safety, stabilization and treatment             -- Daily contact with patient to assess and evaluate symptoms and progress in treatment             -- Patient's case to be discussed in multi-disciplinary team meeting             -- Observation Level: q15 minute checks             -- Vital signs:  q12 hours             -- Precautions: suicide, elopement, and assault   2. Psychiatric Diagnoses and Treatment:                Zoloft  200 mg Abilfy- increased to 15 mg QHS Klonopin  changed back to her home dose per PDMP 1 mg TID   -- The risks/benefits/side-effects/alternatives to this medication were discussed in detail with the patient and time was given for questions. The patient consents to medication trial.                -- Metabolic profile and EKG monitoring obtained while on an atypical antipsychotic (BMI: Lipid Panel: HbgA1c: QTc:)              -- Encouraged patient to participate in unit milieu and in scheduled group therapies                            3. Medical Issues Being Addressed:   No urgent medical needs   4. Discharge Planning:   -- Social work and case management to assist with discharge planning and identification of hospital follow-up needs prior to discharge  -- Estimated LOS: 3-4 days  Breanah Faddis, MD 07/14/2024, 10:16 PM

## 2024-07-14 NOTE — Plan of Care (Signed)
  Problem: Education: Goal: Knowledge of [place] [facility] Education information/materials will improve Outcome: Progressing Goal: Emotional status will improve Outcome: Progressing Goal: Mental status will improve Outcome: Progressing Goal: Verbalization of understanding the information provided will improve Outcome: Progressing   Problem: Activity: Goal: Interest or engagement in activities will improve Outcome: Progressing Goal: Sleeping patterns will improve Outcome: Progressing   Problem: Coping: Goal: Ability to verbalize frustrations and anger appropriately will improve Outcome: Progressing Goal: Ability to demonstrate self-control will improve Outcome: Progressing   Problem: Health Behavior/Discharge Planning: Goal: Identification of resources available to assist in meeting health care needs will improve Outcome: Progressing Goal: Compliance with treatment plan for underlying cause of condition will improve Outcome: Progressing   Problem: Physical Regulation: Goal: Ability to maintain clinical measurements within normal limits will improve Outcome: Progressing   Problem: Safety: Goal: Periods of time without injury will increase Outcome: Progressing   Problem: Education: Goal: Knowledge of General Education information will improve Description: Including pain rating scale, medication(s)/side effects and non-pharmacologic comfort measures Outcome: Progressing   Problem: Health Behavior/Discharge Planning: Goal: Ability to manage health-related needs will improve Outcome: Progressing   Problem: Clinical Measurements: Goal: Ability to maintain clinical measurements within normal limits will improve Outcome: Progressing Goal: Will remain free from infection Outcome: Progressing Goal: Diagnostic test results will improve Outcome: Progressing Goal: Respiratory complications will improve Outcome: Progressing Goal: Cardiovascular complication will be  avoided Outcome: Progressing   Problem: Activity: Goal: Risk for activity intolerance will decrease Outcome: Progressing   Problem: Nutrition: Goal: Adequate nutrition will be maintained Outcome: Progressing   Problem: Coping: Goal: Level of anxiety will decrease Outcome: Progressing   Problem: Elimination: Goal: Will not experience complications related to bowel motility Outcome: Progressing Goal: Will not experience complications related to urinary retention Outcome: Progressing   Problem: Pain Managment: Goal: General experience of comfort will improve and/or be controlled Outcome: Progressing   Problem: Safety: Goal: Ability to remain free from injury will improve Outcome: Progressing   Problem: Skin Integrity: Goal: Risk for impaired skin integrity will decrease Outcome: Progressing   Problem: Education: Goal: Ability to describe self-care measures that may prevent or decrease complications (Diabetes Survival Skills Education) will improve Outcome: Progressing Goal: Individualized Educational Video(s) Outcome: Progressing   Problem: Coping: Goal: Ability to adjust to condition or change in health will improve Outcome: Progressing   Problem: Fluid Volume: Goal: Ability to maintain a balanced intake and output will improve Outcome: Progressing   Problem: Health Behavior/Discharge Planning: Goal: Ability to identify and utilize available resources and services will improve Outcome: Progressing Goal: Ability to manage health-related needs will improve Outcome: Progressing   Problem: Metabolic: Goal: Ability to maintain appropriate glucose levels will improve Outcome: Progressing   Problem: Nutritional: Goal: Maintenance of adequate nutrition will improve Outcome: Progressing Goal: Progress toward achieving an optimal weight will improve Outcome: Progressing   Problem: Skin Integrity: Goal: Risk for impaired skin integrity will decrease Outcome:  Progressing   Problem: Tissue Perfusion: Goal: Adequacy of tissue perfusion will improve Outcome: Progressing

## 2024-07-14 NOTE — Progress Notes (Signed)
 Mood: Pleasant, flat affect   Endorses / Denies: Endorses anxiety.  Denies SI/HI and AVH.    Medication/ PRNs: Compliant with scheduled medications.  No PRNs needed.  Pain: Denies  Present in milieu / group attendance:  Isolates to room.  Participated in MHT group only. Minimal interaction with peers and staff.   15 min checks in place for safety.

## 2024-07-14 NOTE — Group Note (Signed)
 Date:  07/14/2024 Time:  5:18 AM  Group Topic/Focus:  Dimensions of Wellness:   The focus of this group is to introduce the topic of wellness and discuss the role each dimension of wellness plays in total health. Wrap-Up Group:   The focus of this group is to help patients review their daily goal of treatment and discuss progress on daily workbooks. Video Meditation: The focus of this group was to introduce meditation for beginners and demonstrate how to surround their day with positive thinking leading with daily affirmations.    Participation Level:  Active  Participation Quality:  Sharing  Affect:  Appropriate  Cognitive:  Appropriate  Insight: Improving  Engagement in Group:  Engaged  Modes of Intervention:  Discussion and Education  Additional Comments:    Kristen VEAR Gibbon 07/14/2024, 5:18 AM

## 2024-07-14 NOTE — Group Note (Signed)
 Recreation Therapy Group Note   Group Topic:Goal Setting  Group Date: 07/14/2024 Start Time: 1510 End Time: 1550 Facilitators: Celestia Jeoffrey BRAVO, LRT, CTRS Location: Dayroom  Group Description: Product/process development scientist. Patients were given many different magazines, a glue stick, markers, and a piece of cardstock paper. LRT and pts discussed the importance of having goals in life. LRT and pts discussed the difference between short-term and long-term goals, as well as what a SMART goal is. LRT encouraged pts to create a vision board, with images they picked and then cut out with safety scissors from the magazine, for themselves, that capture their short and long-term goals. LRT encouraged pts to show and explain their vision board to the group.   Goal Area(s) Addressed:  Patient will gain knowledge of short vs. long term goals.  Patient will identify goals for themselves. Patient will practice setting SMART goals. Patient will verbalize their goals to LRT and peers.   Affect/Mood: N/A   Participation Level: Did not attend    Clinical Observations/Individualized Feedback: Patient did not attend group.   Plan: Continue to engage patient in RT group sessions 2-3x/week.   [address] LRT, CTRS 07/14/2024 5:22 PM

## 2024-07-14 NOTE — Group Note (Signed)
 Date:  07/14/2024 Time:  10:57 PM  Group Topic/Focus:  Self Care:   The focus of this group is to help patients understand the importance of self-care in order to improve or restore emotional, physical, spiritual, interpersonal, and financial health.    Participation Level:  Active  Participation Quality:  Appropriate  Affect:  Appropriate  Cognitive:  Appropriate  Insight: Appropriate  Engagement in Group:  Engaged  Modes of Intervention:  Exploration  Additional Comments:    Laymon ONEIDA Finder 07/14/2024, 10:57 PM

## 2024-07-15 LAB — GLUCOSE, CAPILLARY
Glucose-Capillary: 178 mg/dL — ABNORMAL HIGH (ref 70–99)
Glucose-Capillary: 202 mg/dL — ABNORMAL HIGH (ref 70–99)
Glucose-Capillary: 158 mg/dL — ABNORMAL HIGH (ref 70–99)
Glucose-Capillary: 202 mg/dL — ABNORMAL HIGH (ref 70–99)

## 2024-07-15 MED ORDER — ARIPIPRAZOLE 15 MG PO TABS: 15.0000 mg | ORAL_TABLET | Freq: Every day | ORAL | 0 refills | Status: DC

## 2024-07-15 MED ORDER — GLIPIZIDE ER 5 MG PO TB24: 5.0000 mg | ORAL_TABLET | Freq: Every day | ORAL | 0 refills | Status: DC

## 2024-07-15 MED ORDER — CLONAZEPAM 1 MG PO TABS: 1.0000 mg | ORAL_TABLET | Freq: Three times a day (TID) | ORAL | 0 refills | Status: DC

## 2024-07-15 MED ORDER — SERTRALINE HCL 100 MG PO TABS: 200.0000 mg | ORAL_TABLET | Freq: Every day | ORAL | 0 refills | Status: DC

## 2024-07-15 NOTE — Progress Notes (Signed)
 Mood/ affect:  Pleasant and cooperative.  Sad affect.   Psych assessment:  Endorses anxiety.  Denies SI/HI and AVH.  Reports poor sleep.   Behavior:  Present in the milieu for meals and groups. Minimal interaction with peers and staff.   Medication/ PRNs:  Compliant with scheduled medications.  BP med held (103/56).   Pain: Denies  15 min checks in place for safety.

## 2024-07-15 NOTE — Group Note (Signed)
 Date:  07/15/2024 Time:  8:50 PM  Group Topic/Focus:  Goals Group:   The focus of this group is to help patients establish daily goals to achieve during treatment and discuss how the patient can incorporate goal setting into their daily lives to aide in recovery.    Participation Level:  Active  Participation Quality:  Appropriate  Affect:  Appropriate  Cognitive:  Appropriate  Insight: Good  Engagement in Group:  Engaged  Modes of Intervention:  Discussion  Additional Comments:    Vanessa Barajas 07/15/2024, 8:50 PM

## 2024-07-15 NOTE — Progress Notes (Signed)
 Dr. Jadapalle gave permission for patient to use her cell phone to pay a bill.  Pt remained at nurse's station while the phone was in her possession.  Phone returned to patient belongings by this Clinical research associate.

## 2024-07-15 NOTE — Plan of Care (Signed)
   Problem: Education: Goal: Knowledge of [facility] Education information/materials will improve Outcome: Progressing Goal: Verbalization of understanding the information provided will improve Outcome: Progressing   Problem: Activity: Goal: Interest or engagement in activities will improve Outcome: Progressing

## 2024-07-15 NOTE — Group Note (Signed)
 Date:  07/15/2024 Time:  12:10 PM  Group Topic/Focus:  Fresh air therapy with music and cards    Participation Level:  Active  Participation Quality:  Appropriate  Affect:  Appropriate  Cognitive:  Appropriate  Insight: Appropriate  Engagement in Group:  Engaged  Modes of Intervention:  Activity  Additional Comments:  none  Norleen SHAUNNA Bias 07/15/2024, 12:10 PM

## 2024-07-15 NOTE — Progress Notes (Signed)
 [facility] MD Progress Note  07/15/2024 2:22 PM Vanessa Barajas  MRN:  [id number]  Vanessa Barajas is a 53 y.o. female admitted: Medicallyfor 07/09/2024  2:03 AM per Dr. Neda with PMH for DMT2 on insulin , GERD, Osteoarthritis, Knee Pain, HTN, Anxiety, Depression, morbid obesity class III who was BIB EMS with c/o intentional overdose to end her life. Patient is admitted to [facility] unit with Q15 min safety monitoring. Multidisciplinary team approach is offered. Medication management; group/milieu therapy is offered.   Subjective:  Chart reviewed, case discussed in multidisciplinary meeting, patient seen during rounds.  Patient is noted to be resting in bed.  She offers no complaints.  She reports being excited about the discharge planning tomorrow.  She reports looking forward to engage in the community.  She denies SI/HI/plan and denies hallucinations. Sleep: Fair  Appetite:  Fair  Past Psychiatric History: see h&P Family History:  Family History  Problem Relation Age of Onset   Leukemia Mother    Cancer Mother 65       leukemia   Hypertension Mother    Cancer Sister    Cancer Maternal Grandmother        breast   Social History:  Social History   Substance and Sexual Activity  Alcohol Use No     Social History   Substance and Sexual Activity  Drug Use No    Social History   Socioeconomic History   Marital status: Divorced    Spouse name: Not on file   Number of children: Not on file   Years of education: Not on file   Highest education level: Not on file  Occupational History   Not on file  Tobacco Use   Smoking status: Former    Current packs/day: 0.00    Types: Cigarettes    Quit date: 09/02/2013    Years since quitting: 10.8   Smokeless tobacco: Never  Vaping Use   Vaping status: Every Day  Substance and Sexual Activity   Alcohol use: No   Drug use: No   Sexual activity: Not Currently    Birth control/protection: Pill  Other Topics Concern   Not on file  Social  History Narrative   Not on file   Social Drivers of Health   Financial Resource Strain: Low Risk  (02/02/2024)   Received from [facility]   Overall Financial Resource Strain (CARDIA)    Difficulty of Paying Living Expenses: Not hard at all  Food Insecurity: No Food Insecurity (07/10/2024)   Hunger Vital Sign    Worried About Running Out of Food in the Last Year: Never true    Ran Out of Food in the Last Year: Never true  Recent Concern: Food Insecurity - Food Insecurity Present (07/09/2024)   Hunger Vital Sign    Worried About Running Out of Food in the Last Year: Sometimes true    Ran Out of Food in the Last Year: Sometimes true  Transportation Needs: No Transportation Needs (07/10/2024)   PRAPARE - Administrator, Civil Service (Medical): No    Lack of Transportation (Non-Medical): No  Recent Concern: Transportation Needs - Unmet Transportation Needs (07/09/2024)   PRAPARE - Transportation    Lack of Transportation (Medical): Yes    Lack of Transportation (Non-Medical): Yes  Physical Activity: Inactive (02/02/2024)   Received from [facility]   Exercise Vital Sign    On average, how many days per week do you engage in moderate to strenuous exercise (like a brisk  walk)?: 0 days    On average, how many minutes do you engage in exercise at this level?: 30 min  Stress: No Stress Concern Present (02/02/2024)   Received from [facility] - Occupational Stress Questionnaire    Feeling of Stress : Not at all  Social Connections: Socially Integrated (02/02/2024)   Received from [facility]   Social Network    How would you rate your social network (family, work, friends)?: Good participation with social networks   Past Medical History:  Past Medical History:  Diagnosis Date   Anxiety    Diabetes mellitus without complication (HCC)    Hemorrhoids    Hypertension     Past Surgical History:  Procedure Laterality Date    ACHILLES TENDON SURGERY     ADENOIDECTOMY     ECTOPIC PREGNANCY SURGERY     x2   HERNIA REPAIR     TONSILLECTOMY     TUBAL LIGATION      Current Medications: Current Facility-Administered Medications  Medication Dose Route Frequency Provider Last Rate Last Admin   acetaminophen  (TYLENOL ) tablet 650 mg  650 mg Oral Q6H PRN Starkes-Perry, Takia S, FNP       alum & mag hydroxide-simeth (MAALOX/MYLANTA) 200-200-20 MG/5ML suspension 30 mL  30 mL Oral Q4H PRN Starkes-Perry, Majel RAMAN, FNP       amLODipine  (NORVASC ) tablet 10 mg  10 mg Oral Daily Wilkie Majel RAMAN, FNP   10 mg at 07/14/24 9078   ARIPiprazole  (ABILIFY ) tablet 15 mg  15 mg Oral Daily Donnica Jarnagin, MD   15 mg at 07/15/24 0917   calamine lotion 1 Application  1 Application Topical PRN Wilkie Majel RAMAN, FNP       clonazePAM  (KLONOPIN ) tablet 1 mg  1 mg Oral TID Tirzah Fross, MD   1 mg at 07/15/24 9082   empagliflozin  (JARDIANCE ) tablet 25 mg  25 mg Oral Daily Starkes-Perry, Takia S, FNP   25 mg at 07/15/24 9082   fluticasone  (FLONASE ) 50 MCG/ACT nasal spray 2 spray  2 spray Each Nare Daily PRN Wilkie Majel RAMAN, FNP       glipiZIDE  (GLUCOTROL  XL) 24 hr tablet 5 mg  5 mg Oral Q breakfast Pope Brunty, MD   5 mg at 07/15/24 9196   insulin  aspart (novoLOG ) injection 0-15 Units  0-15 Units Subcutaneous TID WC Theadora Noyes, MD   5 Units at 07/15/24 1154   insulin  aspart (novoLOG ) injection 0-5 Units  0-5 Units Subcutaneous QHS Tekeshia Klahr, MD   2 Units at 07/14/24 2150   magnesium  hydroxide (MILK OF MAGNESIA) suspension 30 mL  30 mL Oral Daily PRN Starkes-Perry, Majel RAMAN, FNP       OLANZapine  (ZYPREXA ) injection 5 mg  5 mg Intramuscular TID PRN Starkes-Perry, Majel RAMAN, FNP       OLANZapine  zydis (ZYPREXA ) disintegrating tablet 5 mg  5 mg Oral TID PRN Wilkie Majel RAMAN, FNP       pantoprazole  (PROTONIX ) EC tablet 40 mg  40 mg Oral Daily Wilkie Majel RAMAN, FNP   40 mg at 07/15/24 9082   sertraline   (ZOLOFT ) tablet 200 mg  200 mg Oral Daily Wilkie Majel RAMAN, FNP   200 mg at 07/15/24 9081   zolpidem  (AMBIEN ) tablet 5 mg  5 mg Oral QHS PRN Wilkie Majel RAMAN, FNP   5 mg at 07/14/24 2153    Lab Results:  Results for orders placed or performed during the hospital encounter of  07/10/24 (from the past 48 hours)  Glucose, capillary     Status: Abnormal   Collection Time: 07/13/24  4:10 PM  Result Value Ref Range   Glucose-Capillary 174 (H) 70 - 99 mg/dL    Comment: Glucose reference range applies only to samples taken after fasting for at least 8 hours.  Glucose, capillary     Status: Abnormal   Collection Time: 07/13/24  7:35 PM  Result Value Ref Range   Glucose-Capillary 230 (H) 70 - 99 mg/dL    Comment: Glucose reference range applies only to samples taken after fasting for at least 8 hours.  Glucose, capillary     Status: Abnormal   Collection Time: 07/14/24  7:19 AM  Result Value Ref Range   Glucose-Capillary 195 (H) 70 - 99 mg/dL    Comment: Glucose reference range applies only to samples taken after fasting for at least 8 hours.  Glucose, capillary     Status: Abnormal   Collection Time: 07/14/24 11:36 AM  Result Value Ref Range   Glucose-Capillary 196 (H) 70 - 99 mg/dL    Comment: Glucose reference range applies only to samples taken after fasting for at least 8 hours.  Glucose, capillary     Status: Abnormal   Collection Time: 07/14/24  4:37 PM  Result Value Ref Range   Glucose-Capillary 179 (H) 70 - 99 mg/dL    Comment: Glucose reference range applies only to samples taken after fasting for at least 8 hours.  Glucose, capillary     Status: Abnormal   Collection Time: 07/14/24  8:02 PM  Result Value Ref Range   Glucose-Capillary 222 (H) 70 - 99 mg/dL    Comment: Glucose reference range applies only to samples taken after fasting for at least 8 hours.  Glucose, capillary     Status: Abnormal   Collection Time: 07/15/24  7:36 AM  Result Value Ref Range    Glucose-Capillary 178 (H) 70 - 99 mg/dL    Comment: Glucose reference range applies only to samples taken after fasting for at least 8 hours.  Glucose, capillary     Status: Abnormal   Collection Time: 07/15/24 10:56 AM  Result Value Ref Range   Glucose-Capillary 202 (H) 70 - 99 mg/dL    Comment: Glucose reference range applies only to samples taken after fasting for at least 8 hours.    Blood Alcohol level:  Lab Results  Component Value Date   [facility] <15 07/09/2024    Metabolic Disorder Labs: Lab Results  Component Value Date   HGBA1C 11.5 (H) 07/13/2024   MPG 283 07/13/2024   MPG 280.48 07/10/2024   No results found for: PROLACTIN Lab Results  Component Value Date   CHOL 206 (H) 07/13/2024   TRIG 140 07/13/2024   HDL 40 (L) 07/13/2024   CHOLHDL 5.2 07/13/2024   VLDL 28 07/13/2024   LDLCALC 138 (H) 07/13/2024   LDLCALC UNABLE TO CALCULATE IF TRIGLYCERIDE OVER 400 mg/dL 90/78/7987    Physical Findings: AIMS:  , ,  ,  ,    CIWA:    COWS:      Psychiatric Specialty Exam:  Presentation  General Appearance:  Appropriate for Environment; Casual  Eye Contact: Fair  Speech: Clear and Coherent  Speech Volume: Normal    Mood and Affect  Mood: Anxious; Depressed  Affect: Depressed; Flat   Thought Process  Thought Processes: Coherent  Descriptions of Associations:Intact  Orientation:Full (Time, Place and Person)  Thought Content:Logical  Hallucinations: Denies  Ideas of Reference:None  Suicidal Thoughts: Denies  Homicidal Thoughts: Denies   Sensorium  Memory: Immediate Fair; Recent Fair; Remote Fair  Judgment: Fair  Insight: Fair   Chartered certified accountant: Fair  Attention Span: Fair  Recall: [employer] of Knowledge: Fair  Language: Fair   Psychomotor Activity  Psychomotor Activity: No data recorded  Musculoskeletal: Strength & Muscle Tone: within normal limits Gait & Station: normal Assets   Assets: Manufacturing systems engineer; Desire for Improvement; Resilience    Physical Exam: Physical Exam Vitals and nursing note reviewed.    ROS Blood pressure (!) 103/56, pulse 86, temperature 98.6 F (37 C), resp. rate 16, height 5' 7 (1.702 m), weight 133.8 kg, SpO2 95%. Body mass index is 46.2 kg/m.  Diagnosis: Principal Problem:   MDD (major depressive disorder), recurrent episode, severe (HCC) Clinical Decision Making: Patient with history of depression and anxiety currently admitted to inpatient unit for worsening depression in the context of financial stressors and stress at work leading up to overdose on her medications.  Patient needs to be monitored closely for safety.   Treatment Plan Summary:   Safety and Monitoring:             -- Voluntary admission to inpatient psychiatric unit for safety, stabilization and treatment             -- Daily contact with patient to assess and evaluate symptoms and progress in treatment             -- Patient's case to be discussed in multi-disciplinary team meeting             -- Observation Level: q15 minute checks             -- Vital signs:  q12 hours             -- Precautions: suicide, elopement, and assault   2. Psychiatric Diagnoses and Treatment:                Zoloft  200 mg Abilfy- increased to 15 mg QHS Klonopin  changed back to her home dose per PDMP 1 mg TID   -- The risks/benefits/side-effects/alternatives to this medication were discussed in detail with the patient and time was given for questions. The patient consents to medication trial.                -- Metabolic profile and EKG monitoring obtained while on an atypical antipsychotic (BMI: Lipid Panel: HbgA1c: QTc:)              -- Encouraged patient to participate in unit milieu and in scheduled group therapies                            3. Medical Issues Being Addressed:   No urgent medical needs   4. Discharge Planning:   -- Social work and case management to  assist with discharge planning and identification of hospital follow-up needs prior to discharge  -- Estimated LOS: 3-4 days  Omar Orrego, MD 07/15/2024, 2:22 PM

## 2024-07-15 NOTE — Group Note (Signed)
 Physical/Occupational Therapy Group Note  Group Topic: UE Therex   Group Date: 07/15/2024 Start Time: 1310 End Time: 1350 Facilitators: Clive Warren CROME, OT   Group Description: Group instructed in series of upper extremities exercises, aimed to promote strength, flexibility, range of motion and functional endurance.  Patients provided cuing for proper mechanics and proper pace of exercise; exercises adjusted as necessary for individualized patient needs.  Patient also engaged in cognitive components throughout session, working to integrate attention to task, command following, turn-taking and appropriate social interaction throughout session.  Allowed to ask questions as appropriate, and encouraged to identify specific exercises that they could complete independently outside of group sessions.  Therapeutic Goal(s):  Demonstrate appropriate performance of upper extremity exercises to promote strength, flexibility, range of motion and functional endurance Identify 2-3 specific upper extremity exercises to complete as home exercise program outside of group session  Individual Participation: Pt did not attend.    Participation Level: Did not attend   Participation Quality:   Behavior:   Speech/Thought Process:   Affect/Mood:   Insight:   Judgement:   Modes of Intervention:   Patient Response to Interventions:    Plan: Continue to engage patient in PT/OT groups 1 - 2x/week.  Kalum Minner R., MPH, MS, OTR/L ascom [phone] 07/15/24, 3:09 PM

## 2024-07-15 NOTE — Group Note (Signed)
 Recreation Therapy Group Note   Group Topic:Stress Management  Group Date: 07/15/2024 Start Time: 1500 End Time: 1600 Facilitators: Celestia Jeoffrey BRAVO, LRT, CTRS Location: Courtyard  Group Description: Stress Jenga. LRT and pts played games of Jenga. LRT prompted group discussion on the physical signs and symptoms of stress, similar to the feeling when you're playing Jenga and trying to remove a wooden block from the stack without it all collapsing. LRT and pt discussed the physical and mental signs of stress, as well as coping skills to manage them.   Goal Area(s) Addressed: Patient will identify physical symptoms of stress. Patient will identify coping skills for stress. Patient will build frustration tolerance skills.  Patient will increase communication.    Affect/Mood: N/A   Participation Level: Did not attend    Clinical Observations/Individualized Feedback: Patient did not attend group.   Plan: Continue to engage patient in RT group sessions 2-3x/week.   Jeoffrey BRAVO Celestia, LRT, CTRS 07/15/2024 4:43 PM

## 2024-07-15 NOTE — Plan of Care (Signed)
   Problem: Education: Goal: Knowledge of [place] [facility] Education information/materials will improve Outcome: Progressing Goal: Emotional status will improve Outcome: Progressing Goal: Mental status will improve Outcome: Progressing

## 2024-07-16 LAB — GLUCOSE, CAPILLARY
Glucose-Capillary: 175 mg/dL — ABNORMAL HIGH (ref 70–99)
Glucose-Capillary: 216 mg/dL — ABNORMAL HIGH (ref 70–99)

## 2024-07-16 NOTE — Discharge Summary (Signed)
 Physician Discharge Summary Note  Patient:  Vanessa Barajas is an 53 y.o., female MRN:  [id number] DOB:  1971-04-21 Patient phone:  [phone] (home)  Patient address:   [address] [place] [address],  Total Time spent with patient: 30 minutes  Date of Admission:  07/10/2024 Date of Discharge: 07/16/2024  Reason for Admission: Suicide attempt tried to overdose on insulin .  Principal Problem: MDD (major depressive disorder), recurrent episode, severe (HCC) Discharge Diagnoses: Principal Problem:   MDD (major depressive disorder), recurrent episode, severe (HCC)   Past Psychiatric History:  Past Psychiatric History:  Psychiatric History:  Information collected from Patient/chart   Prev Dx/Sx: depression Current Psych Provider: DDr. Fonda Law Home Meds (current): Abilify , Klonopin , Zoloft , and Ambien  Previous Med Trials: unknown Therapy: none   Prior Psych Hospitalization: one at [facility] in 2024 Prior Self Harm: none Prior Violence: none   Family Psych History: none Family Hx suicide: none   Social History:  Educational Hx: completed high school Occupational Hx: recently fired from Nurse, adult Hx: none Living Situation: lives alone  Access to weapons/lethal means: denied    Substance History Denies substance abuse other than vaping nicotine daily  Past Medical History:  Past Medical History:  Diagnosis Date   Anxiety    Diabetes mellitus without complication (HCC)    Hemorrhoids    Hypertension     Past Surgical History:  Procedure Laterality Date   ACHILLES TENDON SURGERY     ADENOIDECTOMY     ECTOPIC PREGNANCY SURGERY     x2   HERNIA REPAIR     TONSILLECTOMY     TUBAL LIGATION     Family History:  Family History  Problem Relation Age of Onset   Leukemia Mother    Cancer Mother 58       leukemia   Hypertension Mother    Cancer Sister    Cancer Maternal Grandmother        breast    Social History:  Social History    Substance and Sexual Activity  Alcohol Use No     Social History   Substance and Sexual Activity  Drug Use No    Social History   Socioeconomic History   Marital status: Divorced    Spouse name: Not on file   Number of children: Not on file   Years of education: Not on file   Highest education level: Not on file  Occupational History   Not on file  Tobacco Use   Smoking status: Former    Current packs/day: 0.00    Types: Cigarettes    Quit date: 09/02/2013    Years since quitting: 10.8   Smokeless tobacco: Never  Vaping Use   Vaping status: Every Day  Substance and Sexual Activity   Alcohol use: No   Drug use: No   Sexual activity: Not Currently    Birth control/protection: Pill  Other Topics Concern   Not on file  Social History Narrative   Not on file   Social Drivers of Health   Financial Resource Strain: Low Risk  (02/02/2024)   Received from [facility]   Overall Financial Resource Strain (CARDIA)    Difficulty of Paying Living Expenses: Not hard at all  Food Insecurity: No Food Insecurity (07/10/2024)   Hunger Vital Sign    Worried About Running Out of Food in the Last Year: Never true    Ran Out of Food in the Last Year: Never true  Recent Concern:  Food Insecurity - Food Insecurity Present (07/09/2024)   Hunger Vital Sign    Worried About Running Out of Food in the Last Year: Sometimes true    Ran Out of Food in the Last Year: Sometimes true  Transportation Needs: No Transportation Needs (07/10/2024)   PRAPARE - Administrator, Civil Service (Medical): No    Lack of Transportation (Non-Medical): No  Recent Concern: Transportation Needs - Unmet Transportation Needs (07/09/2024)   PRAPARE - Transportation    Lack of Transportation (Medical): Yes    Lack of Transportation (Non-Medical): Yes  Physical Activity: Inactive (02/02/2024)   Received from [facility]   Exercise Vital Sign    On average, how many days per week do you engage in  moderate to strenuous exercise (like a brisk walk)?: 0 days    On average, how many minutes do you engage in exercise at this level?: 30 min  Stress: No Stress Concern Present (02/02/2024)   Received from [facility] - Occupational Stress Questionnaire    Feeling of Stress : Not at all  Social Connections: Socially Integrated (02/02/2024)   Received from [facility]   Social Network    How would you rate your social network (family, work, friends)?: Good participation with social networks    Hospital Course:   Vanessa Barajas is a 53 y.o. female admitted: Medically for 07/09/2024  2:03 AM with PMH for DMT2 on insulin , GERD, Osteoarthritis, Knee Pain, HTN, Anxiety, Depression, morbid obesity class III who was BIB EMS with c/o intentional overdose to end her life. Patient was admitted and treated with biopsychosocial treatment plan.  She received individual, group, recreational and medication therapy including case management to coordinate her inpatient and outpatient care.  Discharge planning was initiated on the day of admission to ensure a safe discharge.  The presenting symptoms were closely monitored and medications were started as indicated.  On initial interview reports that she was feeling quite overwhelmed secondary to her financial issues.  Reports that she was currently working for her employer for last 12 years but the management changes 1 year back there is put a lot of stress on her.  She reports significant depression, hopeless, worthless for the last several weeks and excessive guilt and has no energy, poor motivation but reports fair sleep and appetite.  Patient reports that she was having suicidal thoughts for the last several weeks and had planned this overdose.   After she injected her with insulin   and when her blood sugar dropped to 56 she suddenly realized and called 911 to seek help. Patient reports that she is not suicidal when she was  initially seen at the [facility] unit.  Patient was admitted to our behavioral unit under suicidal precautions. While at the [facility] unit patient reports that she was not suicidal anymore. Patient was continued on her home medications Zoloft , Abilify , Klonopin .  Patient denies any history of hallucinations but reports that she takes Abilify  as an adjunct to her antidepressant medication. No medication were changed  during hospitalization but patient was closely monitored. Over the course of hospitalization marked improvement is noticed and the patient presenting symptoms, noted subjectively and objectively by the patient and the other staff members.  Medications addressing the principal problem were initiated with improvement in severity sufficient to discharge to a lower level of care. Patient was noted to be Calm cooperative, able to perform her own ADLs, was able to interact well  with staff members and peers.  Patient tolerated medication well and was noted to be brighter, engaging with staff and peers.  Consistently denied any suicidal homicidal ideations for several days before discharge.  No overt psychosis or any suicidal thoughts reported.  Patient was calm cooperative and was noted to be brighter and engaging in groups.  The risks and benefits of medications were discussed with the patient or guardian prior to initiation of any medications, all these medications were titrated to discharge levels see medication list below, patient tolerated these medications and no side effects are noted at this time including tremor akathisia or tardive dyskinesia.  Patient showed slow but steady and sustained symptomatic improvement before discharge.  She reports significant improvement in her mood and depression denied any hallucinations suicidal and homicidal thoughts.  Psychoeducation was provided.  A comprehensive risk assessment was done prior to discharge and shows that patient is at low risk for  suicide or violence and will continue to be if she complies with the treatment recommendations medications and therapy. No depression psychosis suicidal or homicidal ideation reported at this time.   Physical Findings: AIMS:  , ,  ,  ,  ,  ,   CIWA:    COWS:     Musculoskeletal: Strength & Muscle Tone: within normal limits Gait & Station: normal Patient leans: N/A   Psychiatric Specialty Exam:  Presentation  General Appearance:  Appropriate for Environment  Eye Contact: Fair  Speech: Clear and Coherent  Speech Volume: Normal  Handedness: Left   Mood and Affect  Mood: Euthymic  Affect: Congruent   Thought Process  Thought Processes: Coherent  Descriptions of Associations:Intact  Orientation:Full (Time, Place and Person)  Thought Content:Logical  History of Schizophrenia/Schizoaffective disorder:  None Duration of Psychotic Symptoms: no Hallucinations:Hallucinations: None  Ideas of Reference:None  Suicidal Thoughts:Suicidal Thoughts: No  Homicidal Thoughts:Homicidal Thoughts: No   Sensorium  Memory: Immediate Fair; Recent Fair; Remote Fair  Judgment: Fair  Insight: Fair   Art therapist  Concentration: Fair  Attention Span: Fair  Recall: [employer] of Knowledge: Fair  Language: Fair   Psychomotor Activity  Psychomotor Activity: Psychomotor Activity: Normal   Assets  Assets: Communication Skills; Desire for Improvement; Financial Resources/Insurance; Housing; Physical Health; Social Support; Vocational/Educational   Sleep  Sleep: Sleep: Fair  Estimated Sleeping Duration (Last 24 Hours): 8.50-10.50 hours   Physical Exam: Physical Exam ROS Blood pressure (!) 121/56, pulse 79, temperature 98.1 F (36.7 C), resp. rate 16, height 5' 7 (1.702 m), weight 133.8 kg, SpO2 91%. Body mass index is 46.2 kg/m.   Social History   Tobacco Use  Smoking Status Former   Current packs/day: 0.00   Types: Cigarettes    Quit date: 09/02/2013   Years since quitting: 10.8  Smokeless Tobacco Never   Tobacco Cessation:  N/A, patient does not currently use tobacco products  Blood Alcohol level:  Lab Results  Component Value Date   [facility] <15 07/09/2024    Metabolic Disorder Labs:  Lab Results  Component Value Date   HGBA1C 11.5 (H) 07/13/2024   MPG 283 07/13/2024   MPG 280.48 07/10/2024   No results found for: PROLACTIN Lab Results  Component Value Date   CHOL 206 (H) 07/13/2024   TRIG 140 07/13/2024   HDL 40 (L) 07/13/2024   CHOLHDL 5.2 07/13/2024   VLDL 28 07/13/2024   LDLCALC 138 (H) 07/13/2024   LDLCALC UNABLE TO CALCULATE IF TRIGLYCERIDE OVER 400 mg/dL 90/78/7987    See Psychiatric  Specialty Exam and Suicide Risk Assessment completed by Attending Physician prior to discharge.  Discharge destination: She smokes cigarette we do not know that she here essays patient tobacco use former never  Is patient on multiple antipsychotic therapies at discharge:  No   Has Patient had three or more failed trials of antipsychotic monotherapy by history:  No  Recommended Plan for Multiple Antipsychotic Therapies: NA   Allergies as of 07/16/2024       Reactions   Lisinopril Swelling   Angioedema   Losartan Swelling        Medication List     STOP taking these medications    insulin  glargine 100 UNIT/ML injection Commonly known as: LANTUS       TAKE these medications      Indication  amLODipine  10 MG tablet Commonly known as: NORVASC  Take 10 mg by mouth daily.    ARIPiprazole  15 MG tablet Commonly known as: ABILIFY  Take 1 tablet (15 mg total) by mouth daily. What changed:  medication strength how much to take  Indication: Major Depressive Disorder   clonazePAM  1 MG tablet Commonly known as: KLONOPIN  Take 1 tablet (1 mg total) by mouth 3 (three) times daily for 7 days. What changed:  how much to take when to take this    fluticasone  50 MCG/ACT nasal spray Commonly  known as: FLONASE  Place 2 sprays into both nostrils daily as needed for allergies or rhinitis.    glipiZIDE  5 MG 24 hr tablet Commonly known as: GLUCOTROL  XL Take 1 tablet (5 mg total) by mouth daily with breakfast.    Jardiance  25 MG Tabs tablet Generic drug: empagliflozin  Take 25 mg by mouth daily.    oxyCODONE -acetaminophen  5-325 MG tablet Commonly known as: PERCOCET/ROXICET Take 2 tablets by mouth every 6 (six) hours as needed for moderate pain (pain score 4-6).    pantoprazole  40 MG tablet Commonly known as: PROTONIX  Take 40 mg by mouth daily.    sertraline  100 MG tablet Commonly known as: ZOLOFT  Take 2 tablets (200 mg total) by mouth daily.    zolpidem  10 MG tablet Commonly known as: AMBIEN  Take 10 mg by mouth at bedtime as needed for sleep.  Indication: Trouble Sleeping        Follow-up Information     [facility] Follow up.   Why: Appointment is scheduled for 07/21/2024 at 9:30 via the phone.  They will call [phone] Contact information: [address]  [address] [place] [address] [phone]                 Follow-up recommendations:   Follow up recommendations: # It is recommended to the patient to continue psychiatric medications as prescribed, after discharge from the hospital.   # It is recommended to the patient to follow up with your outpatient psychiatric provider and PCP. # It was discussed with the patient, the impact of alcohol, drugs, tobacco have been there overall psychiatric and medical wellbeing, and total abstinence from substance use was recommended. # Prescriptions provided or sent directly to preferred pharmacy at discharge. Patient agreeable to plan. Given the opportunity to ask questions. Appears to feel comfortable with discharge.  # In the event of worsening symptoms, the patient is instructed to call the crisis hotline (988), 911 and or go to the nearest ED for appropriate evaluation and treatment of symptoms. To  follow-up with primary care provider for other medical issues, concerns and or health care needs   Signed: Keayra Graham, MD 07/16/2024, 11:48  AM

## 2024-07-16 NOTE — Progress Notes (Signed)
  [facility] Adult Case Management Discharge Plan :  Will you be returning to the same living situation after discharge:  Yes,  Patient to return home.  At discharge, do you have transportation home?: Yes,  CSW has arranged taxi services on patient's behalf.  Do you have the ability to pay for your medications: Yes,  BLUE CROSS BLUE SHIELD / BCBS COMM PPO  Release of information consent forms completed and in the chart;  Patient's signature needed at discharge.  Patient to Follow up at:  Follow-up Information     [facility] Follow up.   Why: Appointment is scheduled for 07/21/2024 at 9:30 via the phone.  They will call [phone] Contact information: [address]  [address] [place] [address] [phone]                 Next level of care provider has access to [facility] Link:no  Safety Planning and Suicide Prevention discussed: Yes, SPE completed with pt, as pt refused to consent to family contact. SPI pamphlet provided to pt and pt was encouraged to share information with support network, ask questions, and talk about any concerns relating to SPE. Pt denies access to guns/firearms and verbalized understanding of information provided. Mobile Crisis information also provided to pt.       Has patient been referred to the Quitline?: Patient refused referral for treatment  Patient has been referred for addiction treatment: No known substance use disorder.  Tiffiny Worthy M Julya Alioto, LCSW 07/16/2024, 10:51 AM

## 2024-07-16 NOTE — BHH Suicide Risk Assessment (Signed)
 [facility] Discharge Suicide Risk Assessment   Principal Problem: MDD (major depressive disorder), recurrent episode, severe (HCC) Discharge Diagnoses: Principal Problem:   MDD (major depressive disorder), recurrent episode, severe (HCC)   Total Time spent with patient: 30 minutes  Musculoskeletal: Strength & Muscle Tone: within normal limits Gait & Station: normal Patient leans: N/A  Psychiatric Specialty Exam  Presentation  General Appearance:  Appropriate for Environment; Casual  Eye Contact: Fair  Speech: Clear and Coherent  Speech Volume: Normal  Handedness: Right   Mood and Affect  Mood: better Affect: euthymic  Thought Process  Thought Processes: Coherent  Descriptions of Associations:Intact  Orientation:Full (Time, Place and Person)  Thought Content:Logical   Hallucinations:denies Ideas of Reference:None  Suicidal Thoughts:denies Homicidal Thoughts:denies  Sensorium  Memory: Immediate Fair; Recent Fair; Remote Fair  Judgment: Fair  Insight: Fair   Art therapist  Concentration: Fair  Attention Span: Fair  Recall: [employer] of Knowledge: Fair  Language: Fair   Psychomotor Activity  Psychomotor Activity:No data recorded  Assets  Assets: Communication Skills; Desire for Improvement; Resilience   Sleep  Sleep:No data recorded Estimated Sleeping Duration (Last 24 Hours): 8.50-10.50 hours  Physical Exam: Physical Exam ROS Blood pressure (!) 121/56, pulse 79, temperature 98.1 F (36.7 C), resp. rate 16, height 5' 7 (1.702 m), weight 133.8 kg, SpO2 91%. Body mass index is 46.2 kg/m.  Mental Status Per Nursing Assessment::   On Admission:  NA  Demographic Factors:  Caucasian  Loss Factors: Decrease in vocational status  Historical Factors: NA  Risk Reduction Factors:   Positive social support, Positive therapeutic relationship, and Positive coping skills or problem solving skills  Continued Clinical  Symptoms:  Depression:   Insomnia  Cognitive Features That Contribute To Risk:  None    Suicide Risk:  Minimal: No identifiable suicidal ideation.  Patients presenting with no risk factors but with morbid ruminations; may be classified as minimal risk based on the severity of the depressive symptoms   Follow-up Information     [facility] Follow up.   Why: Appointment is scheduled for 07/21/2024 at 9:30 via the phone.  They will call [phone] Contact information: [address]  [address] [place] [address] [phone]                 Plan Of Care/Follow-up recommendations:  Activity:  As tolerated  Mardene Lessig, MD 07/16/2024, 10:00 AM

## 2024-07-16 NOTE — Group Note (Signed)
 Date:  07/16/2024 Time:  10:39 AM  Group Topic/Focus:  Goals Group:   The focus of this group is to help patients establish daily goals to achieve during treatment and discuss how the patient can incorporate goal setting into their daily lives to aide in recovery. We also talked about healthy eating and healthy lifestyle choices.     Participation Level:  Did Not Attend  Participation Quality:    Affect:    Cognitive:    Insight:   Engagement in Group:    Modes of Intervention:    Additional Comments:    Vanessa Barajas 07/16/2024, 10:39 AM

## 2024-07-16 NOTE — Progress Notes (Signed)
   07/16/24 0038  Psych Admission Type (Psych Patients Only)  Admission Status Voluntary  Psychosocial Assessment  Patient Complaints Anxiety  Eye Contact Fair  Facial Expression Sad  Affect Sad  Speech Logical/coherent  Interaction Minimal  Motor Activity Slow  Appearance/Hygiene Disheveled  Behavior Characteristics Cooperative;Appropriate to situation  Mood Pleasant  Thought Process  Coherency WDL  Content WDL  Delusions None reported or observed  Perception WDL  Hallucination None reported or observed  Judgment Impaired  Confusion None

## 2024-07-27 ENCOUNTER — Other Ambulatory Visit: Payer: Self-pay

## 2024-07-27 ENCOUNTER — Inpatient Hospital Stay (HOSPITAL_COMMUNITY)
Admission: EM | Admit: 2024-07-27 | Discharge: 2024-07-29 | DRG: 917 | Disposition: A | Discharge status: Other Acute Inpatient Hospital | Attending: Internal Medicine | Admitting: Internal Medicine

## 2024-07-27 ENCOUNTER — Encounter (HOSPITAL_COMMUNITY): Payer: Self-pay

## 2024-07-27 ENCOUNTER — Emergency Department (HOSPITAL_COMMUNITY)

## 2024-07-27 DIAGNOSIS — Z806 Family history of leukemia: Secondary | ICD-10-CM

## 2024-07-27 DIAGNOSIS — N179 Acute kidney failure, unspecified: Secondary | ICD-10-CM | POA: Diagnosis present

## 2024-07-27 DIAGNOSIS — T461X2A Poisoning by calcium-channel blockers, intentional self-harm, initial encounter: Secondary | ICD-10-CM | POA: Diagnosis not present

## 2024-07-27 DIAGNOSIS — F17290 Nicotine dependence, other tobacco product, uncomplicated: Secondary | ICD-10-CM | POA: Diagnosis present

## 2024-07-27 DIAGNOSIS — E876 Hypokalemia: Secondary | ICD-10-CM | POA: Diagnosis present

## 2024-07-27 DIAGNOSIS — E860 Dehydration: Secondary | ICD-10-CM | POA: Diagnosis present

## 2024-07-27 DIAGNOSIS — Z79899 Other long term (current) drug therapy: Secondary | ICD-10-CM

## 2024-07-27 DIAGNOSIS — Z7984 Long term (current) use of oral hypoglycemic drugs: Secondary | ICD-10-CM

## 2024-07-27 DIAGNOSIS — E1110 Type 2 diabetes mellitus with ketoacidosis without coma: Principal | ICD-10-CM | POA: Diagnosis present

## 2024-07-27 DIAGNOSIS — R45851 Suicidal ideations: Secondary | ICD-10-CM

## 2024-07-27 DIAGNOSIS — Z794 Long term (current) use of insulin: Secondary | ICD-10-CM

## 2024-07-27 DIAGNOSIS — Z8249 Family history of ischemic heart disease and other diseases of the circulatory system: Secondary | ICD-10-CM

## 2024-07-27 DIAGNOSIS — Z91148 Patient's other noncompliance with medication regimen for other reason: Secondary | ICD-10-CM

## 2024-07-27 DIAGNOSIS — E66813 Obesity, class 3: Secondary | ICD-10-CM | POA: Diagnosis present

## 2024-07-27 DIAGNOSIS — T50902A Poisoning by unspecified drugs, medicaments and biological substances, intentional self-harm, initial encounter: Secondary | ICD-10-CM

## 2024-07-27 DIAGNOSIS — Z6841 Body Mass Index (BMI) 40.0 and over, adult: Secondary | ICD-10-CM

## 2024-07-27 DIAGNOSIS — I10 Essential (primary) hypertension: Secondary | ICD-10-CM | POA: Diagnosis present

## 2024-07-27 DIAGNOSIS — R4182 Altered mental status, unspecified: Secondary | ICD-10-CM | POA: Diagnosis not present

## 2024-07-27 DIAGNOSIS — Z5986 Financial insecurity: Secondary | ICD-10-CM

## 2024-07-27 DIAGNOSIS — F332 Major depressive disorder, recurrent severe without psychotic features: Secondary | ICD-10-CM | POA: Diagnosis present

## 2024-07-27 DIAGNOSIS — Z803 Family history of malignant neoplasm of breast: Secondary | ICD-10-CM

## 2024-07-27 DIAGNOSIS — Z888 Allergy status to other drugs, medicaments and biological substances status: Secondary | ICD-10-CM

## 2024-07-27 DIAGNOSIS — F419 Anxiety disorder, unspecified: Secondary | ICD-10-CM | POA: Diagnosis present

## 2024-07-27 DIAGNOSIS — G4700 Insomnia, unspecified: Secondary | ICD-10-CM | POA: Diagnosis present

## 2024-07-27 DIAGNOSIS — T1491XA Suicide attempt, initial encounter: Secondary | ICD-10-CM | POA: Diagnosis present

## 2024-07-27 LAB — TROPONIN T, HIGH SENSITIVITY
Troponin T High Sensitivity: 15 ng/L (ref 0–19)
Troponin T High Sensitivity: <15 ng/L (ref 0–19)

## 2024-07-27 LAB — BASIC METABOLIC PANEL WITH GFR
Anion gap: 21 — ABNORMAL HIGH (ref 5–15)
BUN: 16 mg/dL (ref 6–20)
CO2: 18 mmol/L — ABNORMAL LOW (ref 22–32)
Calcium: 9.1 mg/dL (ref 8.9–10.3)
Chloride: 97 mmol/L — ABNORMAL LOW (ref 98–111)
Creatinine, Ser: 1.15 mg/dL — ABNORMAL HIGH (ref 0.44–1.00)
GFR, Estimated: 56 mL/min — ABNORMAL LOW (ref >60)
Glucose, Bld: 411 mg/dL — ABNORMAL HIGH (ref 70–99)
Potassium: 2.8 mmol/L — ABNORMAL LOW (ref 3.5–5.1)
Sodium: 136 mmol/L (ref 135–145)

## 2024-07-27 LAB — CBC WITH DIFFERENTIAL/PLATELET
Abs Immature Granulocytes: 0.08 K/uL — ABNORMAL HIGH (ref 0.00–0.07)
Basophils Absolute: 0.1 K/uL (ref 0.0–0.1)
Basophils Relative: 1 %
Eosinophils Absolute: 0 K/uL (ref 0.0–0.5)
Eosinophils Relative: 0 %
HCT: 44.7 % (ref 36.0–46.0)
Hemoglobin: 14.9 g/dL (ref 12.0–15.0)
Immature Granulocytes: 1 %
Lymphocytes Relative: 11 %
Lymphs Abs: 1.3 K/uL (ref 0.7–4.0)
MCH: 28.4 pg (ref 26.0–34.0)
MCHC: 33.3 g/dL (ref 30.0–36.0)
MCV: 85.1 fL (ref 80.0–100.0)
Monocytes Absolute: 0.9 K/uL (ref 0.1–1.0)
Monocytes Relative: 8 %
Neutro Abs: 9.4 K/uL — ABNORMAL HIGH (ref 1.7–7.7)
Neutrophils Relative %: 79 %
Platelets: 273 K/uL (ref 150–400)
RBC: 5.25 MIL/uL — ABNORMAL HIGH (ref 3.87–5.11)
RDW: 13.3 % (ref 11.5–15.5)
WBC: 11.8 K/uL — ABNORMAL HIGH (ref 4.0–10.5)
nRBC: 0 % (ref 0.0–0.2)

## 2024-07-27 LAB — ACETAMINOPHEN LEVEL: Acetaminophen (Tylenol), Serum: <10 ug/mL — ABNORMAL LOW (ref 10–30)

## 2024-07-27 MED ORDER — POTASSIUM CHLORIDE 10 MEQ/100ML IV SOLN
10.0000 meq | INTRAVENOUS | Status: AC
  Administered 2024-07-28 (×4): 10 meq via INTRAVENOUS
  Filled 2024-07-27 (×4): qty 100

## 2024-07-27 MED ORDER — DEXTROSE IN LACTATED RINGERS 5 % IV SOLN: INTRAVENOUS | Status: DC

## 2024-07-27 MED ORDER — INSULIN ASPART 100 UNIT/ML IJ SOLN
6.0000 [IU] | Freq: Once | INTRAMUSCULAR | Status: AC
  Administered 2024-07-27: 6 [IU] via SUBCUTANEOUS
  Filled 2024-07-27: qty 0.06

## 2024-07-27 MED ORDER — ONDANSETRON 4 MG PO TBDP
4.0000 mg | ORAL_TABLET | Freq: Once | ORAL | Status: AC
  Administered 2024-07-27: 4 mg via ORAL
  Filled 2024-07-27: qty 1

## 2024-07-27 MED ORDER — POTASSIUM CHLORIDE CRYS ER 20 MEQ PO TBCR
60.0000 meq | EXTENDED_RELEASE_TABLET | Freq: Once | ORAL | Status: AC
  Administered 2024-07-27: 60 meq via ORAL
  Filled 2024-07-27: qty 3

## 2024-07-27 MED ORDER — LACTATED RINGERS IV SOLN: INTRAVENOUS | Status: DC

## 2024-07-27 MED ORDER — MAGNESIUM SULFATE 2 GM/50ML IV SOLN
2.0000 g | Freq: Once | INTRAVENOUS | Status: AC
  Administered 2024-07-27: 2 g via INTRAVENOUS
  Filled 2024-07-27: qty 50

## 2024-07-27 MED ORDER — INSULIN REGULAR(HUMAN) IN NACL 100-0.9 UT/100ML-% IV SOLN
INTRAVENOUS | Status: DC
  Administered 2024-07-28: 15 [IU]/h via INTRAVENOUS
  Filled 2024-07-27: qty 100

## 2024-07-27 MED ORDER — SODIUM CHLORIDE 0.9 % IV BOLUS: 1000.0000 mL | Freq: Once | INTRAVENOUS | Status: DC

## 2024-07-27 MED ORDER — LACTATED RINGERS IV BOLUS
1000.0000 mL | Freq: Once | INTRAVENOUS | Status: AC
  Administered 2024-07-27: 1000 mL via INTRAVENOUS

## 2024-07-27 MED ORDER — DEXTROSE 50 % IV SOLN: 0.0000 mL | INTRAVENOUS | Status: DC | PRN

## 2024-07-27 NOTE — ED Provider Triage Note (Signed)
 Emergency Medicine Provider Triage Evaluation Note  Vanessa Barajas , a 53 y.o. female  was evaluated in triage.  Pt complains of chest pain and shortness of breath.  She states that she is behind on all of her bills and does not want to remove more.  She states that she took 50 clonidine tablets yesterday and attempt to end her life.  She did this at approximately 8:30 AM.  She reports associated nausea and dry mouth.  Review of Systems  Positive:  Negative: See above   Physical Exam  BP (!) 179/86 (BP Location: Left Arm)   Pulse (!) 119   Temp 98.6 F (37 C) (Oral)   Resp 16   SpO2 94%  Gen:   Awake, no distress   Resp:  Normal effort  MSK:   Moves extremities without difficulty  Other:    Medical Decision Making  Medically screening exam initiated at 4:03 PM.  Appropriate orders placed.  Gwendola Feldmeier was informed that the remainder of the evaluation will be completed by another provider, this initial triage assessment does not replace that evaluation, and the importance of remaining in the ED until their evaluation is complete.     Theotis Peers [place], [place] 07/27/24 1605

## 2024-07-27 NOTE — ED Triage Notes (Signed)
 BIB EMS for SI, pt states she took 50-60, 10mg  amlodipine  at 0830 yesterday morning. C/o anxiety. Pt has no symptoms at this time. Alert and oriented x4 on arrival.

## 2024-07-27 NOTE — BH Assessment (Addendum)
 TTS Consult will be completed by [facility]. [facility] Coordinator will notify assessment time and provider name in established chat. Thanks

## 2024-07-27 NOTE — ED Notes (Signed)
Pt drinking fluids.

## 2024-07-27 NOTE — ED Provider Notes (Signed)
 [facility]PARTMENT AT [facility] Provider Note   CSN: [id number] Arrival date & time: 07/27/24  1542     Patient presents with: Suicidal   Vanessa Barajas is a 53 y.o. female.   HPI    53 year old female with history of diabetes, hypertension and depression comes in with chief complaint of SI.  Patient states that yesterday around 8:30 AM, she overdosed on 50-60 x 10 mg amlodipine 's.  She did so with the intent of killing herself.  Patient has been stressed about bills.   She currently reports that she is feeling sluggish and just weak.  Review of system is positive for nausea.  Prior to Admission medications   Medication Sig Start Date End Date Taking? Authorizing Provider  amLODipine  (NORVASC ) 10 MG tablet Take 10 mg by mouth daily.    [provider]  ARIPiprazole  (ABILIFY ) 15 MG tablet Take 1 tablet (15 mg total) by mouth daily. 07/16/24   Jadapalle, Sree, MD  clonazePAM  (KLONOPIN ) 1 MG tablet Take 1 tablet (1 mg total) by mouth 3 (three) times daily for 7 days. 07/15/24 07/22/24  Jadapalle, Sree, MD  fluticasone  (FLONASE ) 50 MCG/ACT nasal spray Place 2 sprays into both nostrils daily as needed for allergies or rhinitis.    [provider]  glipiZIDE  (GLUCOTROL  XL) 5 MG 24 hr tablet Take 1 tablet (5 mg total) by mouth daily with breakfast. 07/16/24   Donnelly Mellow, MD  JARDIANCE  25 MG TABS tablet Take 25 mg by mouth daily.    [provider]  oxyCODONE -acetaminophen  (PERCOCET/ROXICET) 5-325 MG tablet Take 2 tablets by mouth every 6 (six) hours as needed for moderate pain (pain score 4-6). 07/10/24   Neda Jennet LABOR, MD  pantoprazole  (PROTONIX ) 40 MG tablet Take 40 mg by mouth daily. 01/12/23   [provider]  sertraline  (ZOLOFT ) 100 MG tablet Take 2 tablets (200 mg total) by mouth daily. 07/15/24   Jadapalle, Sree, MD  zolpidem  (AMBIEN ) 10 MG tablet Take 10 mg by mouth at bedtime as needed for sleep.    [provider]     Allergies: Lisinopril and Losartan    Review of Systems  All other systems reviewed and are negative.   Updated Vital Signs BP (!) 179/86 (BP Location: Left Arm)   Pulse (!) 119   Temp 98.6 F (37 C) (Oral)   Resp 16   SpO2 94%   Physical Exam Vitals and nursing note reviewed.  Constitutional:      Appearance: She is well-developed.  HENT:     Head: Atraumatic.  Eyes:     Extraocular Movements: Extraocular movements intact.     Pupils: Pupils are equal, round, and reactive to light.  Cardiovascular:     Rate and Rhythm: Tachycardia present.  Pulmonary:     Effort: Pulmonary effort is normal.  Musculoskeletal:     Cervical back: Normal range of motion and neck supple.  Skin:    General: Skin is warm and dry.  Neurological:     Mental Status: She is alert and oriented to person, place, and time.     (all labs ordered are listed, but only abnormal results are displayed) Labs Reviewed  CBC WITH DIFFERENTIAL/PLATELET - Abnormal; Notable for the following components:      Result Value   WBC 11.8 (*)    RBC 5.25 (*)    Neutro Abs 9.4 (*)    Abs Immature Granulocytes 0.08 (*)    All other components within normal  limits  BASIC METABOLIC PANEL WITH GFR - Abnormal; Notable for the following components:   Potassium 2.8 (*)    Chloride 97 (*)    CO2 18 (*)    Glucose, Bld 411 (*)    Creatinine, Ser 1.15 (*)    GFR, Estimated 56 (*)    Anion gap 21 (*)    All other components within normal limits  ACETAMINOPHEN  LEVEL - Abnormal; Notable for the following components:   Acetaminophen  (Tylenol ), Serum <10 (*)    All other components within normal limits  URINE DRUG SCREEN  HEPATIC FUNCTION PANEL  BLOOD GAS, VENOUS  BETA-HYDROXYBUTYRIC ACID  BETA-HYDROXYBUTYRIC ACID  BETA-HYDROXYBUTYRIC ACID  BETA-HYDROXYBUTYRIC ACID  BETA-HYDROXYBUTYRIC ACID  CBG MONITORING, ED  TROPONIN T, HIGH SENSITIVITY  TROPONIN T, HIGH SENSITIVITY    EKG: None  Radiology: DG Chest  2 View Result Date: 07/27/2024 CLINICAL DATA:  Chest pain and shortness of breath. EXAM: CHEST - 2 VIEW COMPARISON:  None Available. FINDINGS: The heart size and mediastinal contours are within normal limits. Both lungs are clear. The visualized skeletal structures are unremarkable. IMPRESSION: No active cardiopulmonary disease. Electronically Signed   By: Lynwood Landy Raddle M.D.   On: 07/27/2024 16:53     .Critical Care  Performed by: Charlyn Sora, MD Authorized by: Charlyn Sora, MD   Critical care provider statement:    Critical care time (minutes):  48   Critical care was necessary to treat or prevent imminent or life-threatening deterioration of the following conditions:  Endocrine crisis   Critical care was time spent personally by me on the following activities:  Development of treatment plan with patient or surrogate, discussions with consultants, evaluation of patient's response to treatment, examination of patient, ordering and review of laboratory studies, ordering and review of radiographic studies, ordering and performing treatments and interventions, pulse oximetry, re-evaluation of patient's condition, review of old charts and obtaining history from patient or surrogate    Medications Ordered in the ED  magnesium  sulfate IVPB 2 g 50 mL (2 g Intravenous New Bag/Given 07/27/24 2337)  insulin  regular, human (MYXREDLIN ) 100 units/ 100 mL infusion (has no administration in time range)  lactated ringers  infusion (has no administration in time range)  dextrose  5 % in lactated ringers  infusion (has no administration in time range)  dextrose  50 % solution 0-50 mL (has no administration in time range)  potassium chloride  10 mEq in 100 mL IVPB (has no administration in time range)  ondansetron  (ZOFRAN -ODT) disintegrating tablet 4 mg (4 mg Oral Given 07/27/24 1942)  insulin  aspart (novoLOG ) injection 6 Units (6 Units Subcutaneous Given 07/27/24 2107)  lactated ringers  bolus 1,000 mL (1,000  mLs Intravenous New Bag/Given 07/27/24 2336)  potassium chloride  SA (KLOR-CON  M) CR tablet 60 mEq (60 mEq Oral Given 07/27/24 2338)                                    Medical Decision Making Amount and/or Complexity of Data Reviewed Labs: ordered.  Risk Prescription drug management. Decision regarding hospitalization.   53 year old female comes in with chief complaint of suicidal ideation.  She has history of hypertension, diabetes, depression. Currently patient is comfortable appearing.  She is slightly tachycardic.  She denies any chest pain, shortness of breath, dizziness.  She has generalized malaise.  Pt denies chest pains, shortness of breath, headaches, abdominal pain, uti like symptoms. I have reviewed previous encounters for this patient  and reviewed their primary medications.  Differential diagnosis considered for this patient includes: Depression Bipolar disorder Schizophrenia Substance abuse Suicidal ideation Acute withdrawal Toxic effects from amlodipine  overdose  Appropriate labs have been ordered.  11:48 PM Patient's lab results reveal hyperglycemia, elevated creatinine, elevated anion gap and a bicarb of 18.  Clinically she has DKA. Her potassium is noted to be 2.8.  I consulted poison control.  They recommend continued BP monitoring and adding LFTs to the workup.  If patient is being honest, then she should be cleared from the overdose perspective.  Her labs are concerning for DKA.  We will start her on insulin  drip and admit her. Patient is not medically cleared for psychiatric assessment at this time.    Final diagnoses:  Diabetic ketoacidosis without coma associated with type 2 diabetes mellitus (HCC)  Suicidal ideation  Intentional overdose, initial encounter [facility])    ED Discharge Orders     None          Charlyn Sora, MD 07/27/24 2349

## 2024-07-28 ENCOUNTER — Encounter (HOSPITAL_COMMUNITY): Payer: Self-pay | Admitting: Internal Medicine

## 2024-07-28 DIAGNOSIS — Z91148 Patient's other noncompliance with medication regimen for other reason: Secondary | ICD-10-CM | POA: Diagnosis not present

## 2024-07-28 DIAGNOSIS — G4700 Insomnia, unspecified: Secondary | ICD-10-CM | POA: Diagnosis present

## 2024-07-28 DIAGNOSIS — T1491XA Suicide attempt, initial encounter: Secondary | ICD-10-CM | POA: Diagnosis not present

## 2024-07-28 DIAGNOSIS — T461X2A Poisoning by calcium-channel blockers, intentional self-harm, initial encounter: Secondary | ICD-10-CM | POA: Diagnosis present

## 2024-07-28 DIAGNOSIS — E876 Hypokalemia: Secondary | ICD-10-CM | POA: Diagnosis present

## 2024-07-28 DIAGNOSIS — Z7984 Long term (current) use of oral hypoglycemic drugs: Secondary | ICD-10-CM | POA: Diagnosis not present

## 2024-07-28 DIAGNOSIS — Z5986 Financial insecurity: Secondary | ICD-10-CM | POA: Diagnosis not present

## 2024-07-28 DIAGNOSIS — I10 Essential (primary) hypertension: Secondary | ICD-10-CM

## 2024-07-28 DIAGNOSIS — Z803 Family history of malignant neoplasm of breast: Secondary | ICD-10-CM | POA: Diagnosis not present

## 2024-07-28 DIAGNOSIS — F332 Major depressive disorder, recurrent severe without psychotic features: Secondary | ICD-10-CM

## 2024-07-28 DIAGNOSIS — Z6841 Body Mass Index (BMI) 40.0 and over, adult: Secondary | ICD-10-CM | POA: Diagnosis not present

## 2024-07-28 DIAGNOSIS — Z806 Family history of leukemia: Secondary | ICD-10-CM | POA: Diagnosis not present

## 2024-07-28 DIAGNOSIS — Z794 Long term (current) use of insulin: Secondary | ICD-10-CM | POA: Diagnosis not present

## 2024-07-28 DIAGNOSIS — F419 Anxiety disorder, unspecified: Secondary | ICD-10-CM | POA: Diagnosis present

## 2024-07-28 DIAGNOSIS — T50902A Poisoning by unspecified drugs, medicaments and biological substances, intentional self-harm, initial encounter: Secondary | ICD-10-CM

## 2024-07-28 DIAGNOSIS — E1110 Type 2 diabetes mellitus with ketoacidosis without coma: Secondary | ICD-10-CM

## 2024-07-28 DIAGNOSIS — N179 Acute kidney failure, unspecified: Secondary | ICD-10-CM

## 2024-07-28 DIAGNOSIS — E66813 Obesity, class 3: Secondary | ICD-10-CM | POA: Diagnosis present

## 2024-07-28 DIAGNOSIS — E860 Dehydration: Secondary | ICD-10-CM | POA: Diagnosis present

## 2024-07-28 DIAGNOSIS — F17290 Nicotine dependence, other tobacco product, uncomplicated: Secondary | ICD-10-CM | POA: Diagnosis present

## 2024-07-28 DIAGNOSIS — Z888 Allergy status to other drugs, medicaments and biological substances status: Secondary | ICD-10-CM | POA: Diagnosis not present

## 2024-07-28 DIAGNOSIS — R4182 Altered mental status, unspecified: Secondary | ICD-10-CM | POA: Diagnosis present

## 2024-07-28 DIAGNOSIS — Z8249 Family history of ischemic heart disease and other diseases of the circulatory system: Secondary | ICD-10-CM | POA: Diagnosis not present

## 2024-07-28 DIAGNOSIS — Z79899 Other long term (current) drug therapy: Secondary | ICD-10-CM | POA: Diagnosis not present

## 2024-07-28 LAB — BASIC METABOLIC PANEL WITH GFR
Anion gap: 17 — ABNORMAL HIGH (ref 5–15)
Anion gap: 13 (ref 5–15)
Anion gap: 13 (ref 5–15)
Anion gap: 14 (ref 5–15)
Anion gap: 14 (ref 5–15)
BUN: 18 mg/dL (ref 6–20)
BUN: 16 mg/dL (ref 6–20)
BUN: 12 mg/dL (ref 6–20)
BUN: 14 mg/dL (ref 6–20)
BUN: 14 mg/dL (ref 6–20)
CO2: 20 mmol/L — ABNORMAL LOW (ref 22–32)
CO2: 22 mmol/L (ref 22–32)
CO2: 21 mmol/L — ABNORMAL LOW (ref 22–32)
CO2: 19 mmol/L — ABNORMAL LOW (ref 22–32)
CO2: 19 mmol/L — ABNORMAL LOW (ref 22–32)
Calcium: 8.8 mg/dL — ABNORMAL LOW (ref 8.9–10.3)
Calcium: 8.5 mg/dL — ABNORMAL LOW (ref 8.9–10.3)
Calcium: 8.4 mg/dL — ABNORMAL LOW (ref 8.9–10.3)
Calcium: 9.2 mg/dL (ref 8.9–10.3)
Calcium: 8.7 mg/dL — ABNORMAL LOW (ref 8.9–10.3)
Chloride: 100 mmol/L (ref 98–111)
Chloride: 102 mmol/L (ref 98–111)
Chloride: 101 mmol/L (ref 98–111)
Chloride: 103 mmol/L (ref 98–111)
Chloride: 103 mmol/L (ref 98–111)
Creatinine, Ser: 1.04 mg/dL — ABNORMAL HIGH (ref 0.44–1.00)
Creatinine, Ser: 0.83 mg/dL (ref 0.44–1.00)
Creatinine, Ser: 0.9 mg/dL (ref 0.44–1.00)
Creatinine, Ser: 0.87 mg/dL (ref 0.44–1.00)
Creatinine, Ser: 1.03 mg/dL — ABNORMAL HIGH (ref 0.44–1.00)
GFR, Estimated: >60 mL/min (ref >60)
GFR, Estimated: >60 mL/min (ref >60)
GFR, Estimated: >60 mL/min (ref >60)
GFR, Estimated: >60 mL/min (ref >60)
GFR, Estimated: >60 mL/min (ref >60)
Glucose, Bld: 234 mg/dL — ABNORMAL HIGH (ref 70–99)
Glucose, Bld: 176 mg/dL — ABNORMAL HIGH (ref 70–99)
Glucose, Bld: 248 mg/dL — ABNORMAL HIGH (ref 70–99)
Glucose, Bld: 298 mg/dL — ABNORMAL HIGH (ref 70–99)
Glucose, Bld: 320 mg/dL — ABNORMAL HIGH (ref 70–99)
Potassium: 2.7 mmol/L — CL (ref 3.5–5.1)
Potassium: 2.7 mmol/L — CL (ref 3.5–5.1)
Potassium: 3.7 mmol/L (ref 3.5–5.1)
Potassium: 4.3 mmol/L (ref 3.5–5.1)
Potassium: 4.7 mmol/L (ref 3.5–5.1)
Sodium: 137 mmol/L (ref 135–145)
Sodium: 137 mmol/L (ref 135–145)
Sodium: 135 mmol/L (ref 135–145)
Sodium: 136 mmol/L (ref 135–145)
Sodium: 135 mmol/L (ref 135–145)

## 2024-07-28 LAB — BLOOD GAS, VENOUS
Acid-Base Excess: 4.9 mmol/L — ABNORMAL HIGH (ref 0.0–2.0)
Bicarbonate: 27.9 mmol/L (ref 20.0–28.0)
O2 Saturation: 95.5 %
Patient temperature: 37
pCO2, Ven: 35 mmHg — ABNORMAL LOW (ref 44–60)
pH, Ven: 7.51 — ABNORMAL HIGH (ref 7.25–7.43)
pO2, Ven: 68 mmHg — ABNORMAL HIGH (ref 32–45)

## 2024-07-28 LAB — URINE DRUG SCREEN
Amphetamines: NEGATIVE
Barbiturates: NEGATIVE
Benzodiazepines: POSITIVE — AB
Cocaine: NEGATIVE
Fentanyl: NEGATIVE
Methadone Scn, Ur: NEGATIVE
Opiates: NEGATIVE
Tetrahydrocannabinol: NEGATIVE

## 2024-07-28 LAB — HEPATIC FUNCTION PANEL
ALT: 17 U/L (ref 0–44)
AST: 29 U/L (ref 15–41)
Albumin: 3.7 g/dL (ref 3.5–5.0)
Alkaline Phosphatase: 109 U/L (ref 38–126)
Bilirubin, Direct: 0.4 mg/dL — ABNORMAL HIGH (ref 0.0–0.2)
Indirect Bilirubin: 0.4 mg/dL (ref 0.3–0.9)
Total Bilirubin: 0.8 mg/dL (ref 0.0–1.2)
Total Protein: 6.4 g/dL — ABNORMAL LOW (ref 6.5–8.1)

## 2024-07-28 LAB — PHOSPHORUS: Phosphorus: 2.2 mg/dL — ABNORMAL LOW (ref 2.5–4.6)

## 2024-07-28 LAB — GLUCOSE, CAPILLARY
Glucose-Capillary: 200 mg/dL — ABNORMAL HIGH (ref 70–99)
Glucose-Capillary: 154 mg/dL — ABNORMAL HIGH (ref 70–99)
Glucose-Capillary: 159 mg/dL — ABNORMAL HIGH (ref 70–99)
Glucose-Capillary: 173 mg/dL — ABNORMAL HIGH (ref 70–99)
Glucose-Capillary: 167 mg/dL — ABNORMAL HIGH (ref 70–99)
Glucose-Capillary: 159 mg/dL — ABNORMAL HIGH (ref 70–99)
Glucose-Capillary: 170 mg/dL — ABNORMAL HIGH (ref 70–99)
Glucose-Capillary: 259 mg/dL — ABNORMAL HIGH (ref 70–99)
Glucose-Capillary: 327 mg/dL — ABNORMAL HIGH (ref 70–99)
Glucose-Capillary: 249 mg/dL — ABNORMAL HIGH (ref 70–99)

## 2024-07-28 LAB — MRSA NEXT GEN BY PCR, NASAL: MRSA by PCR Next Gen: NOT DETECTED

## 2024-07-28 LAB — BETA-HYDROXYBUTYRIC ACID
Beta-Hydroxybutyric Acid: <0.05 mmol/L — ABNORMAL LOW (ref 0.05–0.27)
Beta-Hydroxybutyric Acid: 0.08 mmol/L (ref 0.05–0.27)

## 2024-07-28 LAB — MAGNESIUM: Magnesium: 2.1 mg/dL (ref 1.7–2.4)

## 2024-07-28 LAB — CBG MONITORING, ED: Glucose-Capillary: 326 mg/dL — ABNORMAL HIGH (ref 70–99)

## 2024-07-28 LAB — LACTIC ACID, PLASMA
Lactic Acid, Venous: 1.3 mmol/L (ref 0.5–1.9)
Lactic Acid, Venous: 1.5 mmol/L (ref 0.5–1.9)

## 2024-07-28 LAB — SALICYLATE LEVEL: Salicylate Lvl: <7 mg/dL — ABNORMAL LOW (ref 7.0–30.0)

## 2024-07-28 MED ORDER — ENOXAPARIN SODIUM 40 MG/0.4ML IJ SOSY
40.0000 mg | PREFILLED_SYRINGE | INTRAMUSCULAR | Status: DC
  Filled 2024-07-28: qty 0.4

## 2024-07-28 MED ORDER — PANTOPRAZOLE SODIUM 40 MG PO TBEC
40.0000 mg | DELAYED_RELEASE_TABLET | Freq: Every day | ORAL | Status: DC
  Administered 2024-07-28 – 2024-07-29 (×2): 40 mg via ORAL
  Filled 2024-07-28 (×2): qty 1

## 2024-07-28 MED ORDER — DEXTROSE IN LACTATED RINGERS 5 % IV SOLN: INTRAVENOUS | Status: DC

## 2024-07-28 MED ORDER — INSULIN GLARGINE 100 UNIT/ML ~~LOC~~ SOLN
20.0000 [IU] | Freq: Once | SUBCUTANEOUS | Status: AC
  Administered 2024-07-28: 20 [IU] via SUBCUTANEOUS
  Filled 2024-07-28: qty 0.2

## 2024-07-28 MED ORDER — LACTATED RINGERS IV SOLN: INTRAVENOUS | Status: DC

## 2024-07-28 MED ORDER — POTASSIUM CHLORIDE 20 MEQ PO PACK
40.0000 meq | PACK | Freq: Once | ORAL | Status: AC
Start: 2024-07-28 — End: 2024-07-28
  Administered 2024-07-28: 40 meq via ORAL
  Filled 2024-07-28: qty 2

## 2024-07-28 MED ORDER — INSULIN ASPART 100 UNIT/ML IJ SOLN
0.0000 [IU] | Freq: Three times a day (TID) | INTRAMUSCULAR | Status: DC
  Administered 2024-07-28: 3 [IU] via SUBCUTANEOUS
  Administered 2024-07-28: 8 [IU] via SUBCUTANEOUS
  Administered 2024-07-28: 11 [IU] via SUBCUTANEOUS
  Administered 2024-07-29 (×2): 3 [IU] via SUBCUTANEOUS
  Administered 2024-07-29: 5 [IU] via SUBCUTANEOUS

## 2024-07-28 MED ORDER — INSULIN STARTER KIT- PEN NEEDLES (ENGLISH)
1.0000 | Freq: Once | Status: AC
  Administered 2024-07-28: 1
  Filled 2024-07-28: qty 1

## 2024-07-28 MED ORDER — INSULIN REGULAR(HUMAN) IN NACL 100-0.9 UT/100ML-% IV SOLN: INTRAVENOUS | Status: DC

## 2024-07-28 MED ORDER — POTASSIUM CHLORIDE CRYS ER 20 MEQ PO TBCR
40.0000 meq | EXTENDED_RELEASE_TABLET | Freq: Once | ORAL | Status: AC
  Administered 2024-07-28: 40 meq via ORAL
  Filled 2024-07-28: qty 2

## 2024-07-28 MED ORDER — ARIPIPRAZOLE 10 MG PO TABS
15.0000 mg | ORAL_TABLET | Freq: Every day | ORAL | Status: DC
  Administered 2024-07-28 – 2024-07-29 (×2): 15 mg via ORAL
  Filled 2024-07-28 (×2): qty 1

## 2024-07-28 MED ORDER — NICOTINE 14 MG/24HR TD PT24
14.0000 mg | MEDICATED_PATCH | Freq: Every day | TRANSDERMAL | Status: DC
  Administered 2024-07-28: 14 mg via TRANSDERMAL
  Filled 2024-07-28: qty 1

## 2024-07-28 MED ORDER — PROCHLORPERAZINE EDISYLATE 10 MG/2ML IJ SOLN
5.0000 mg | Freq: Once | INTRAMUSCULAR | Status: AC
  Administered 2024-07-28: 5 mg via INTRAVENOUS
  Filled 2024-07-28: qty 2

## 2024-07-28 MED ORDER — SERTRALINE HCL 100 MG PO TABS
200.0000 mg | ORAL_TABLET | Freq: Every day | ORAL | Status: DC
  Administered 2024-07-28 – 2024-07-29 (×2): 200 mg via ORAL
  Filled 2024-07-28 (×2): qty 2

## 2024-07-28 MED ORDER — INSULIN ASPART 100 UNIT/ML IJ SOLN
0.0000 [IU] | Freq: Every day | INTRAMUSCULAR | Status: DC
  Administered 2024-07-28 – 2024-07-29 (×2): 2 [IU] via SUBCUTANEOUS

## 2024-07-28 MED ORDER — CLONAZEPAM 1 MG PO TABS
1.0000 mg | ORAL_TABLET | Freq: Three times a day (TID) | ORAL | Status: DC
  Administered 2024-07-28 – 2024-07-29 (×5): 1 mg via ORAL
  Filled 2024-07-28 (×5): qty 1

## 2024-07-28 MED ORDER — POTASSIUM & SODIUM PHOSPHATES 280-160-250 MG PO PACK
2.0000 | PACK | ORAL | Status: AC
Start: 2024-07-28 — End: 2024-07-28
  Administered 2024-07-28 (×3): 2 via ORAL
  Filled 2024-07-28 (×3): qty 2

## 2024-07-28 MED ORDER — CHLORHEXIDINE GLUCONATE CLOTH 2 % EX PADS
6.0000 | MEDICATED_PAD | Freq: Every day | CUTANEOUS | Status: DC
  Administered 2024-07-28: 6 via TOPICAL

## 2024-07-28 MED ORDER — LACTATED RINGERS IV SOLN: Freq: Once | INTRAVENOUS | Status: AC

## 2024-07-28 MED ORDER — DEXTROSE 50 % IV SOLN: 0.0000 mL | INTRAVENOUS | Status: DC | PRN

## 2024-07-28 MED ORDER — POTASSIUM CHLORIDE CRYS ER 20 MEQ PO TBCR
40.0000 meq | EXTENDED_RELEASE_TABLET | ORAL | Status: AC
  Administered 2024-07-28 (×2): 40 meq via ORAL
  Filled 2024-07-28 (×2): qty 2

## 2024-07-28 MED ORDER — POTASSIUM CHLORIDE 10 MEQ/100ML IV SOLN
10.0000 meq | INTRAVENOUS | Status: AC
Start: 2024-07-28 — End: 2024-07-28
  Administered 2024-07-28 (×4): 10 meq via INTRAVENOUS
  Filled 2024-07-28 (×4): qty 100

## 2024-07-28 MED ORDER — LIVING WELL WITH DIABETES BOOK
Freq: Once | Status: AC
  Filled 2024-07-28: qty 1

## 2024-07-28 MED ORDER — ORAL CARE MOUTH RINSE: 15.0000 mL | OROMUCOSAL | Status: DC | PRN

## 2024-07-28 MED ORDER — INSULIN GLARGINE 100 UNIT/ML ~~LOC~~ SOLN
10.0000 [IU] | Freq: Two times a day (BID) | SUBCUTANEOUS | Status: DC
Start: 2024-07-28 — End: 2024-07-29
  Administered 2024-07-28 – 2024-07-29 (×3): 10 [IU] via SUBCUTANEOUS
  Filled 2024-07-28 (×4): qty 0.1

## 2024-07-28 MED ORDER — ENOXAPARIN SODIUM 60 MG/0.6ML IJ SOSY
60.0000 mg | PREFILLED_SYRINGE | Freq: Every day | INTRAMUSCULAR | Status: DC
  Administered 2024-07-28 – 2024-07-29 (×2): 60 mg via SUBCUTANEOUS
  Filled 2024-07-28 (×2): qty 0.6

## 2024-07-28 NOTE — ED Provider Notes (Signed)
 Case is discussed with Dr. Franky of [facility], who agrees to admit the patient.   Raford Lenis, MD 07/28/24 [phone]

## 2024-07-28 NOTE — TOC Initial Note (Signed)
 Transition of Care [facility]) - Initial/Assessment Note    Patient Details  Name: Vanessa Barajas MRN: [id number] Date of Birth: August 06, 1971  [facility] CM/SW Contact:    Jon ONEIDA Anon, RN Phone Number: 07/28/2024, 4:10 PM  Clinical Narrative:                 Pt is from home, states she lives alone. NCM met with pt at bedside, discussed role in DC planning. Spoke with pt about her concerns about financial needs to help with rent/ utilities. She has been out of work on [employer] since August, and has fell behind in her bills. Will add resources to AVS. Pt states she has been using a RW to ambulate prior to admission. Pt confirms PCP/ insurance. Psych has evaluated pt and recommending Inpatient psych once medically stable for discharge. IP Care Management continuing to follow.    Expected Discharge Plan: Psychiatric Hospital Barriers to Discharge: Continued Medical Work up   Patient Goals and CMS Choice Patient states their goals for this hospitalization and ongoing recovery are:: Home CMS Medicare.gov Compare Post Acute Care list provided to:: Patient Choice offered to / list presented to : Patient Vera ownership interest in [facility].provided to:: Patient    Expected Discharge Plan and Services In-house Referral: Clinical Social Work Discharge Planning Services: CM Consult Post Acute Care Choice: Durable Medical Equipment (RW) Living arrangements for the past 2 months: Apartment                 DME Arranged: N/A DME Agency: NA       HH Arranged: NA HH Agency: NA        Prior Living Arrangements/Services Living arrangements for the past 2 months: Apartment Lives with:: Self Patient language and need for interpreter reviewed:: Yes Do you feel safe going back to the place where you live?: Yes      Need for Family Participation in Patient Care: No (Comment) Care giver support system in place?: No (comment)   Criminal Activity/Legal Involvement  Pertinent to Current Situation/Hospitalization: No - Comment as needed  Activities of Daily Living   ADL Screening (condition at time of admission) Independently performs ADLs?: Yes (appropriate for developmental age) Is the patient deaf or have difficulty hearing?: No Does the patient have difficulty seeing, even when wearing glasses/contacts?: No Does the patient have difficulty concentrating, remembering, or making decisions?: No  Permission Sought/Granted Permission sought to share information with : Family Supports Permission granted to share information with : Yes, Verbal Permission Granted  Share Information with NAME: Carin Planas (Sister)  [phone]           Emotional Assessment Appearance:: Appears stated age Attitude/Demeanor/Rapport: Engaged Affect (typically observed): Appropriate Orientation: : Oriented to Self, Oriented to Place, Oriented to  Time, Oriented to Situation Alcohol / Substance Use: Not Applicable Psych Involvement: Yes (comment)  Admission diagnosis:  Suicidal ideation [R45.851] DKA (diabetic ketoacidosis) (HCC) [E11.10] Diabetic ketoacidosis without coma associated with type 2 diabetes mellitus (HCC) [E11.10] Intentional overdose, initial encounter [facility]) [T50.902A] Patient Active Problem List   Diagnosis Date Noted   DKA, type 2 (HCC) 07/28/2024   Hypokalemia 07/28/2024   DKA (diabetic ketoacidosis) (HCC) 07/28/2024   ARF (acute renal failure) (HCC) 07/28/2024   Insulin  overdose, intentional self-harm, initial encounter (HCC) 07/09/2024   Suicide attempt (HCC) 07/09/2024   Major depressive disorder, 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   Menorrhagia 03/29/2018   Angioedema 02/07/2016   Diabetes mellitus without  complication (HCC)    Hypertension    Essential hypertension    PCP:  [name], MD Pharmacy:   [id number] [address], [place] - [phone] [place] [address] Phone:  [phone] Fax: [phone]     Social Drivers of Health (SDOH) Social History: SDOH Screenings   Food Insecurity: No Food Insecurity (07/28/2024)  Recent Concern: Food Insecurity - Food Insecurity Present (07/09/2024)  Housing: Low Risk  (07/28/2024)  Recent Concern: Housing - High Risk (07/09/2024)  Transportation Needs: No Transportation Needs (07/28/2024)  Recent Concern: Transportation Needs - Unmet Transportation Needs (07/09/2024)  Utilities: Not At Risk (07/28/2024)  Alcohol Screen: Low Risk  (07/10/2024)  Financial Resource Strain: Low Risk  (02/02/2024)   Received from [facility]  Physical Activity: Inactive (02/02/2024)   Received from [facility]  Social Connections: Socially Integrated (02/02/2024)   Received from [facility]  Stress: No Stress Concern Present (02/02/2024)   Received from [facility]  Tobacco Use: Medium Risk (07/28/2024)   SDOH Interventions:     Readmission Risk Interventions    07/28/2024    4:08 PM  Readmission Risk Prevention Plan  Post Dischage Appt Complete  Medication Screening Complete  Transportation Screening Complete

## 2024-07-28 NOTE — Inpatient Diabetes Management (Signed)
 Inpatient Diabetes Program Recommendations  AACE/ADA: New Consensus Statement on Inpatient Glycemic Control (2015)  Target Ranges:  Prepandial:   less than 140 mg/dL      Peak postprandial:   less than 180 mg/dL (1-2 hours)      Critically ill patients:  140 - 180 mg/dL   Lab Results  Component Value Date   GLUCAP 259 (H) 07/28/2024   HGBA1C 11.5 (H) 07/13/2024    Review of Glycemic Control  Diabetes history: DM2 Outpatient Diabetes medications: Lantus  30 daily, glipizide  5 mg with breakfast, Novolin  R 23 units TID with meals Current orders for Inpatient glycemic control: Lantus  10 BID, Novolog  0-15 TID with meals and 0-5 HS  HgbA1C - 11.5% Eating 100%  Inpatient Diabetes Program Recommendations:    D/C Lantus  10 BID  Start Lantus  25 units daily on 9/19.  If post-prandials > 180 mg/dL, add Novolog  6 units TID with meals if eating > 50%  Spoke with pt at bedside in ICU this am. Pt states she was previously on Jardiance  and recently stopped taking it. PCP recently increased both Lantus  and Novolin  R. Pt monitors blood sugars 2-3 times/day. States weight is stable. Discussed HgbA1C of 11.5% and goals. Discussed importance of checking CBGs and maintaining good CBG control to prevent long-term and short-term complications. Explained how hyperglycemia leads to damage within blood vessels which lead to the common complications seen with uncontrolled diabetes. Stressed to the patient the importance of improving glycemic control to prevent further complications from uncontrolled diabetes. Discussed impact of nutrition, exercise, stress, sickness, and medications on diabetes control.  Pt verbalized understanding and had no further questions.   Will continue to follow.  Thank you. Shona Brandy, RD, LDN, CDCES Inpatient Diabetes Coordinator [phone]

## 2024-07-28 NOTE — Consult Note (Signed)
 [facility] Psychiatric Consult Follow Up  Patient Name: .Vanessa Barajas  MRN: [id number]  DOB: Dec 01, 1970  Consult Order details:  Orders (From admission, onward)     Start     Ordered   07/09/24 1321  IP CONSULT TO PSYCHIATRY       Ordering Provider: Ezzard Staci SAILOR, NP  Provider:  (Not yet assigned)  Question Answer Comment  Location [facility]   Reason for Consult? suicide attempt      07/09/24 1321             Mode of Visit: In person    Psychiatry Consult Evaluation  Service Date: July 28, 2024 LOS:  LOS: 0 days  Chief Complaint I've been depressed for about 3 weeks.  Primary Psychiatric Diagnoses  MDD, severe, without psychosis 2.  Intential overdose   Assessment  Vanessa Barajas is a 53 y.o. female admitted: Medicallyfor 07/27/2024  7:55 PM per Dr. Neda with PMH for DMT2 on insulin , GERD, Osteoarthritis, Knee Pain, HTN, Anxiety, Depression, morbid obesity class III who was BIB EMS with c/o intentional overdose to end her life.   Her current presentation of dysphoric mood, low energy and motivation, hopeless and worthless feelings with suicidal ideations is most consistent with MDD, severe. Current outpatient psychotropic medications include Abilify , Klonopin , and recently Ambien  and historically she has had a semi-positive response to these medications. She was non compliant with medications prior to admission as evidenced by self-report of not taking her medications since Wednesday. On initial examination, patient was depressed and tearful, financial concerns and recent loss of her job. Please see plan below for detailed recommendations.   Diagnoses:  Active Hospital problems: Principal Problem:   DKA (diabetic ketoacidosis) (HCC) Active Problems:   Major depressive disorder, recurrent severe without psychotic features (HCC)   Hypertension   Suicide attempt (HCC)   DKA, type 2 (HCC)   Hypokalemia   ARF (acute renal failure) (HCC)     Plan   ## Psychiatric Medication Recommendations:  MDD, severe, without psychosis: -Continue Zoloft  200 mg daily -Continue Abilify  10 mg daily  Insomnia: -Continue Ambien  5 mg at bedtime PRN sleep, do not give at the same time as PRN Klonopin   GAD: -Decreased Klonopin  that she reported of 3 mg daily to 1 mg BID since she has not taken it since Wednesday  ## Medical Decision Making Capacity: Not specifically addressed in this encounter  ## Further Work-up:  -- most recent EKG on 07/09/2024 had QtC of 467 -- Pertinent labwork reviewed earlier this admission includes: CBC, chem panel, toxicology, urine, EKG   ## Disposition:-- We recommend inpatient psychiatric hospitalization when medically cleared. Patient is under voluntary admission status at this time; please IVC if attempts to leave hospital.  ## Behavioral / Environmental: - No specific recommendations at this time.     ## Safety and Observation Level:  - Based on my clinical evaluation, I estimate the patient to be at high risk of self harm in the current setting. - At this time, we recommend  1:1 Observation. This decision is based on my review of the chart including patient's history and current presentation, interview of the patient, mental status examination, and consideration of suicide risk including evaluating suicidal ideation, plan, intent, suicidal or self-harm behaviors, risk factors, and protective factors. This judgment is based on our ability to directly address suicide risk, implement suicide prevention strategies, and develop a safety plan while the patient is in the clinical setting. Please contact our  team if there is a concern that risk level has changed.  CSSR Risk Category:C-SSRS RISK CATEGORY: High Risk  Suicide Risk Assessment: Patient has following modifiable risk factors for suicide: under treated depression  and current symptoms: anxiety/panic, insomnia, impulsivity, anhedonia, hopelessness, which we  are addressing by medication management and psychiatric admission after medical clearance. Patient has following non-modifiable or demographic risk factors for suicide: psychiatric hospitalization Patient has the following protective factors against suicide: Supportive friends, no history of suicide attempts, and no history of NSSIB  Thank you for this consult request. Recommendations have been communicated to the primary team.  We will continue to follow at this time.   Vanessa Becker, NP       History of Present Illness  Relevant Aspects of [facility] Course:  Admitted on 07/27/2024 per Dr. Neda with PMH for DMT2 on insulin , GERD, Osteoarthritis, Knee Pain, HTN, Anxiety, Depression, morbid obesity class III who was BIB EMS with c/o intentional overdose to end her life.   Patient Report:  53 yo female presented after an intentional overdose in a suicide attempt.  She stated she has been really depressed for the past 3 weeks since losing her job.  Jesus been depressed for about 3 weeks.  I realized I couldn't pay my bills or rent because I took a week off to take care of my granddaughter when my daughter went to [place].  I was 50 hours over vacation time was realized I wasn't going to get paid.  I got really depressed and didn't go to work the next week.  I'm going to lose everything and my only way out r/t suicide.  Low motivation and energy, hopeless and worthless feelings with dysphoric mood for 3 weeks.  Denies previous suicide attempts.  Admitted at [facility] last year for depression and suicidal ideations.  Anxiety is very high with constant worry that she cannot control, restlessness, and mind racing at times.  Denies trauma and mania symptoms, past and present.  No hallucinations, paranoia, or substance abuse except nicotine  vaping daily.  Her sleep has been poor with 4-6 hours per night, recently prescribed Ambien  5 mg at bedtime PRN sleep.  I haven't taken my meds since  Wednesday.  Her appetite is good.  Support system of one friend.    Discussed psychiatric admission after medical clearance, no objections.  Her medications are managed by her PCP, no therapy.  07/28/2024: Today the client rated her depression as kinda mild yet on the fence about being glad the suicide attempt did not work.  She has been stressed with not working since 8/18 and unable to pay her bills, no support in the area, lives alone.  Her sleep was on and off last night which is typical for her.  Appetite is good.  Anxiety is high with constant worry.  She is agreeable to transfer to inpatient psychiatry once she is medically stable.  Psych ROS:  Depression: high Anxiety:  very high Mania (lifetime and current): none Psychosis: (lifetime and current): none  Review of Systems  Psychiatric/Behavioral:  Positive for depression and suicidal ideas. The patient is nervous/anxious and has insomnia.   All other systems reviewed and are negative.    Psychiatric and Social History  Psychiatric History:  Information collected from patient  Prev Dx/Sx: depression Current Psych Provider: [facility]. [facility] Meds (current): Abilify , Klonopin , Zoloft , and Ambien  Previous Med Trials: unknown Therapy: none  Prior Psych Hospitalization: one at [facility] in 2024 Prior Self Harm: none  Prior Violence: none  Family Psych History: none Family Hx suicide: none  Social History:  Educational Hx: completed high school Occupational Hx: recently fired from Nurse, adult Hx: none Living Situation: lives alone  Access to weapons/lethal means: denied   Substance History Denies substance abuse other than vaping nicotine  daily  Exam Findings  Physical Exam: completed by MD, reviewed by this provider. Vital Signs:  Temp:  [98.2 F (36.8 C)-98.9 F (37.2 C)] 98.9 F (37.2 C) (09/18 0234) Pulse Rate:  [96-119] 101 (09/18 0700) Resp:  [16-30] 30 (09/18 0700) BP:  (103-179)/(45-86) 171/72 (09/18 0600) SpO2:  [87 %-94 %] 94 % (09/18 0700) Weight:  [123.1 kg] 123.1 kg (09/18 0234) Blood pressure (!) 171/72, pulse (!) 101, temperature 98.9 F (37.2 C), temperature source Oral, resp. rate (!) 30, height 5' 7 (1.702 m), weight 123.1 kg, SpO2 94%. Body mass index is 42.51 kg/m.  Physical Exam  Mental Status Exam: General Appearance: Disheveled  Orientation:  Full (Time, Place, and Person)  Memory:  Immediate;   Good Recent;   Good Remote;   Good  Concentration:  Concentration: Fair and Attention Span: Fair  Recall:  Good  Attention  Fair  Eye Contact:  Fair  Speech:  Clear and Coherent  Language:  Good  Volume:  Normal  Mood: depressed, anxious  Affect:  Congruent  Thought Process:  Coherent  Thought Content:  Logical  Suicidal Thoughts:  Yes.  without intent/plan  Homicidal Thoughts:  No  Judgement:  Poor  Insight:  Fair  Psychomotor Activity:  Decreased  Akathisia:  No  Fund of Knowledge:  Fair      Assets:  Housing Leisure Time Resilience  Cognition:  WNL  ADL's:  Impaired  AIMS (if indicated):        Other History   These have been pulled in through the EMR, reviewed, and updated if appropriate.  Family History:  The patient's family history includes Cancer in her maternal grandmother and sister; Cancer (age of onset: 70) in her mother; Hypertension in her mother; Leukemia in her mother.  Medical History: Past Medical History:  Diagnosis Date   Anxiety    Diabetes mellitus without complication (HCC)    Hemorrhoids    Hypertension     Surgical History: Past Surgical History:  Procedure Laterality Date   ACHILLES TENDON SURGERY     ADENOIDECTOMY     ECTOPIC PREGNANCY SURGERY     x2   HERNIA REPAIR     TONSILLECTOMY     TUBAL LIGATION       Medications:   Current Facility-Administered Medications:    ARIPiprazole  (ABILIFY ) tablet 15 mg, 15 mg, Oral, Daily, Kakrakandy, Arshad N, MD   Chlorhexidine  Gluconate  Cloth 2 % PADS 6 each, 6 each, Topical, QHS, Kakrakandy, Arshad N, MD, 6 each at 07/28/24 0219   clonazePAM  (KLONOPIN ) tablet 1 mg, 1 mg, Oral, TID, Kakrakandy, Arshad N, MD   dextrose  5 % in lactated ringers  infusion, , Intravenous, Continuous, Franky Redia SAILOR, MD, Last Rate: 125 mL/hr at 07/28/24 0701, Infusion Verify at 07/28/24 0701   dextrose  50 % solution 0-50 mL, 0-50 mL, Intravenous, PRN, Franky Redia SAILOR, MD   enoxaparin  (LOVENOX ) injection 40 mg, 40 mg, Subcutaneous, Q24H, Franky Redia SAILOR, MD   insulin  aspart (novoLOG ) injection 0-15 Units, 0-15 Units, Subcutaneous, TID WC, Chavez, Abigail, NP, 3 Units at 07/28/24 9193   insulin  aspart (novoLOG ) injection 0-5 Units, 0-5 Units, Subcutaneous, QHS, Chavez, Abigail, NP   insulin   regular, human (MYXREDLIN ) 100 units/ 100 mL infusion, , Intravenous, Continuous, Franky Redia SAILOR, MD, Last Rate: 3.4 mL/hr at 07/28/24 0701, 3.4 Units/hr at 07/28/24 0701   insulin  starter kit- pen needles (English) 1 kit, 1 kit, Other, Once, Lue Elsie BROCKS, MD   living well with diabetes book MISC, , Does not apply, Once, Lue Elsie BROCKS, MD   Oral care mouth rinse, 15 mL, Mouth Rinse, PRN, Franky Redia SAILOR, MD   pantoprazole  (PROTONIX ) EC tablet 40 mg, 40 mg, Oral, Daily, Kakrakandy, Arshad N, MD   potassium & sodium phosphates  (PHOS-NAK) 280-160-250 MG packet 2 packet, 2 packet, Oral, Q4H, Chavez, Abigail, NP, 2 packet at 07/28/24 9377   potassium chloride  10 mEq in 100 mL IVPB, 10 mEq, Intravenous, Q1 Hr x 4, Chavez, Abigail, NP, Stopped at 07/28/24 0830   potassium chloride  SA (KLOR-CON  M) CR tablet 40 mEq, 40 mEq, Oral, Q4H, Lue Elsie BROCKS, MD, 40 mEq at 07/28/24 0754   sertraline  (ZOLOFT ) tablet 200 mg, 200 mg, Oral, Daily, Kakrakandy, Arshad N, MD  Allergies: Allergies  Allergen Reactions   Lisinopril Swelling    Angioedema    Losartan Swelling    Vanessa Becker, NP

## 2024-07-28 NOTE — Plan of Care (Signed)
   Problem: Education: Goal: Ability to describe self-care measures that may prevent or decrease complications (Diabetes Survival Skills Education) will improve Outcome: Progressing Goal: Individualized Educational Video(s) Outcome: Progressing   Problem: Coping: Goal: Ability to adjust to condition or change in health will improve Outcome: Progressing

## 2024-07-28 NOTE — BH Assessment (Signed)
 Clinician messaged Dr. Franky and Olam EMERSON Hasten, RN asking if the pt is on the medical floor, if so the pt will need a psychiatric consult and if they can remove the TTS consult.   Clinician called [facility] to remove consult due the pt being moved to a medical floor. Dr. Franky agreed.    Jackson JONETTA Broach, MS, [facility], [facility] Triage Specialist [phone]

## 2024-07-28 NOTE — H&P (Addendum)
 History and Physical    Vanessa Barajas FMW:[id number] DOB: 1970-12-25 DOA: 07/27/2024  Patient coming from: Home.  Chief Complaint: Drug overdose.  HPI: Vanessa Barajas is a 53 y.o. female with history of depression and anxiety, diabetes mellitus type 2, hypertension presents to the ER after patient states she ingested 60 tablets of 10 mg amlodipine  in the morning.  Later in the evening patient called EMS since patient was feeling anxious and presents to the ER.  Denies any chest pain or shortness of breath.  Patient states she was depressed and took the medications with suicidal thoughts.  Denies overdosing with any other medications.  Patient states her diabetic medications were recently changed and she was placed back on insulin  by her primary care physician after discontinuing Jardiance  since her hemoglobin A1c was elevated.  Patient recently was admitted for suicidal thoughts.  ED Course: In the ER patient initially was tachycardic with labs showing bicarb of 18 anion gap of 21 creatinine 1.1 blood glucose of 411 WBC 11.8 troponins were negative acetaminophen  level was negative.  EKG shows sinus tachycardia.  Poison control was contacted at this time since patient's ingestion was more than 12 hours requested observation and look for hypotension or bradycardia.  Since patient's lab works were concerning for possible DKA was started on insulin  infusion by the ER physician.  Potassium repletion was given.  Patient admitted for further observation.  Review of Systems: As per HPI, rest all negative.   Past Medical History:  Diagnosis Date   Anxiety    Diabetes mellitus without complication (HCC)    Hemorrhoids    Hypertension     Past Surgical History:  Procedure Laterality Date   ACHILLES TENDON SURGERY     ADENOIDECTOMY     ECTOPIC PREGNANCY SURGERY     x2   HERNIA REPAIR     TONSILLECTOMY     TUBAL LIGATION       reports that she quit smoking about 10 years ago. Her smoking use included  cigarettes. She has never used smokeless tobacco. She reports that she does not drink alcohol and does not use drugs.  Allergies  Allergen Reactions   Lisinopril Swelling    Angioedema    Losartan Swelling    Family History  Problem Relation Age of Onset   Leukemia Mother    Cancer Mother 99       leukemia   Hypertension Mother    Cancer Sister    Cancer Maternal Grandmother        breast    Prior to Admission medications   Medication Sig Start Date End Date Taking? Authorizing Provider  amLODipine  (NORVASC ) 10 MG tablet Take 10 mg by mouth daily.   Yes [provider]  ARIPiprazole  (ABILIFY ) 15 MG tablet Take 1 tablet (15 mg total) by mouth daily. 07/16/24  Yes Jadapalle, Sree, MD  clonazePAM  (KLONOPIN ) 1 MG tablet Take 1 tablet (1 mg total) by mouth 3 (three) times daily for 7 days. 07/15/24 07/28/24 Yes Jadapalle, Sree, MD  fluticasone  (FLONASE ) 50 MCG/ACT nasal spray Place 2 sprays into both nostrils daily as needed for allergies or rhinitis.   Yes [provider]  glipiZIDE  (GLUCOTROL  XL) 5 MG 24 hr tablet Take 1 tablet (5 mg total) by mouth daily with breakfast. 07/16/24  Yes Donnelly Mellow, MD  NOVOLIN  R FLEXPEN RELION 100 UNIT/ML FlexPen Inject 23 Units into the skin 3 (three) times daily. 07/16/24  Yes [provider]  oxyCODONE -acetaminophen  (PERCOCET/ROXICET) 5-325 MG tablet  Take 2 tablets by mouth every 6 (six) hours as needed for moderate pain (pain score 4-6). 07/10/24  Yes Olalere, Adewale A, MD  pantoprazole  (PROTONIX ) 40 MG tablet Take 40 mg by mouth daily. 01/12/23  Yes [provider]  sertraline  (ZOLOFT ) 100 MG tablet Take 2 tablets (200 mg total) by mouth daily. 07/15/24  Yes Jadapalle, Sree, MD  zolpidem  (AMBIEN ) 10 MG tablet Take 10 mg by mouth at bedtime as needed for sleep.   Yes [provider]  diclofenac (VOLTAREN) 75 MG EC tablet Take 75 mg by mouth 2 (two) times daily. Patient not taking: Reported on 07/28/2024 07/26/24    [provider]  JARDIANCE  25 MG TABS tablet Take 25 mg by mouth daily. Patient not taking: Reported on 07/28/2024    [provider]    Physical Exam: Constitutional: Moderately built and nourished. Vitals:   07/27/24 1552 07/28/24 0012  BP: (!) 179/86 (!) 103/50  Pulse: (!) 119 (!) 102  Resp: 16 (!) 22  Temp: 98.6 F (37 C) 98.2 F (36.8 C)  TempSrc: Oral Oral  SpO2: 94% 93%   Eyes: Anicteric no pallor. ENMT: No discharge from the ears eyes nose or mouth. Neck: No mass felt.  No neck rigidity. Respiratory: No rhonchi or crepitations. Cardiovascular: S1 S2 heard. Abdomen: Soft nontender bowel sound present. Musculoskeletal: No edema. Skin: No rash. Neurologic: Alert awake oriented to time place and person.  Moves all extremities. Psychiatric: Appears normal.  Normal affect.   Labs on Admission: I have personally reviewed following labs and imaging studies  CBC: Recent Labs  Lab 07/27/24 1612  WBC 11.8*  NEUTROABS 9.4*  HGB 14.9  HCT 44.7  MCV 85.1  PLT 273   Basic Metabolic Panel: Recent Labs  Lab 07/27/24 1612  NA 136  K 2.8*  CL 97*  CO2 18*  GLUCOSE 411*  BUN 16  CREATININE 1.15*  CALCIUM 9.1   GFR: CrCl cannot be calculated (Unknown ideal weight.). Liver Function Tests: No results for input(s): AST, ALT, ALKPHOS, BILITOT, PROT, ALBUMIN in the last 168 hours. No results for input(s): LIPASE, AMYLASE in the last 168 hours. No results for input(s): AMMONIA in the last 168 hours. Coagulation Profile: No results for input(s): INR, PROTIME in the last 168 hours. Cardiac Enzymes: No results for input(s): CKTOTAL, CKMB, CKMBINDEX, TROPONINI in the last 168 hours. BNP (last 3 results) No results for input(s): PROBNP in the last 8760 hours. HbA1C: No results for input(s): HGBA1C in the last 72 hours. CBG: Recent Labs  Lab 07/28/24 0021  GLUCAP 326*   Lipid Profile: No results for input(s):  CHOL, HDL, LDLCALC, TRIG, CHOLHDL, LDLDIRECT in the last 72 hours. Thyroid Function Tests: No results for input(s): TSH, T4TOTAL, FREET4, T3FREE, THYROIDAB in the last 72 hours. Anemia Panel: No results for input(s): VITAMINB12, FOLATE, FERRITIN, TIBC, IRON, RETICCTPCT in the last 72 hours. Urine analysis:    Component Value Date/Time   COLORURINE RED (A) 03/29/2018 0906   APPEARANCEUR TURBID (A) 03/29/2018 0906   LABSPEC  03/29/2018 0906    TEST NOT REPORTED DUE TO COLOR INTERFERENCE OF URINE PIGMENT   PHURINE  03/29/2018 0906    TEST NOT REPORTED DUE TO COLOR INTERFERENCE OF URINE PIGMENT   GLUCOSEU (A) 03/29/2018 0906    TEST NOT REPORTED DUE TO COLOR INTERFERENCE OF URINE PIGMENT   HGBUR (A) 03/29/2018 0906    TEST NOT REPORTED DUE TO COLOR INTERFERENCE OF URINE PIGMENT   BILIRUBINUR (A) 03/29/2018 9093  TEST NOT REPORTED DUE TO COLOR INTERFERENCE OF URINE PIGMENT   KETONESUR (A) 03/29/2018 0906    TEST NOT REPORTED DUE TO COLOR INTERFERENCE OF URINE PIGMENT   PROTEINUR (A) 03/29/2018 0906    TEST NOT REPORTED DUE TO COLOR INTERFERENCE OF URINE PIGMENT   UROBILINOGEN 0.2 07/31/2011 1333   NITRITE (A) 03/29/2018 0906    TEST NOT REPORTED DUE TO COLOR INTERFERENCE OF URINE PIGMENT   LEUKOCYTESUR (A) 03/29/2018 0906    TEST NOT REPORTED DUE TO COLOR INTERFERENCE OF URINE PIGMENT   Sepsis Labs: @LABRCNTIP (procalcitonin:4,lacticidven:4) )No results found for this or any previous visit (from the past 240 hours).   Radiological Exams on Admission: DG Chest 2 View Result Date: 07/27/2024 CLINICAL DATA:  Chest pain and shortness of breath. EXAM: CHEST - 2 VIEW COMPARISON:  None Available. FINDINGS: The heart size and mediastinal contours are within normal limits. Both lungs are clear. The visualized skeletal structures are unremarkable. IMPRESSION: No active cardiopulmonary disease. Electronically Signed   By: Lynwood Landy Raddle M.D.   On: 07/27/2024  16:53    EKG: Independently reviewed.  Sinus tachycardia.  Computer is reading it as A-fib.  Assessment/Plan Principal Problem:   DKA (diabetic ketoacidosis) (HCC) Active Problems:   Hypertension   Suicide attempt (HCC)   MDD (major depressive disorder), recurrent episode, severe (HCC)   DKA, type 2 (HCC)   Hypokalemia    Uncontrolled diabetes mellitus type 2 with elevated anion gap acidosis concerning for DKA.  Beta-hydroxybutyrate acid and repeat metabolic panels are pending.  Was started on insulin  infusion by ER will continue for now.  Follow metabolic panel closely.  Last hemoglobin A1c done 2 weeks ago was 11.5.  Patient states she takes Lantus  30 units with Premeal NovoLog  23 units.  Salicylate levels are pending. Drug overdose with suicidal thoughts patient states she took 60 tablets of 10 mg amlodipine .  I discussed with poison control and at this time Poison control requested observation for any hypotensive episode of bradycardia.  Closely monitor. History of depression recently admitted for suicidal thoughts.  On Abilify  and Zoloft  and Klonopin .  Klonopin  confirmed on PDMP website..  Will consult psychiatry.  Patient is involuntary committed. Acute renal failure likely from dehydration.  Repeat metabolic panel after hydration. Hypokalemia replace and recheck.  Since patient has uncontrolled diabetes with metabolic acidosis and drug overdose will need close monitoring further workup and more than 2 midnight stay.   DVT prophylaxis: Lovenox . Code Status: Full code. Family Communication: Just with patient. Disposition Plan: Stepdown. Consults called: Psychiatry. Admission status: Observation.

## 2024-07-28 NOTE — Progress Notes (Signed)
 PROGRESS NOTE    Vanessa Barajas  FMW:[id number] DOB: 31-Mar-1971 DOA: 07/27/2024 PCP: [name], MD   Brief Narrative:  Vanessa Barajas is a 53 y.o. female with history of depression and anxiety, diabetes mellitus type 2, hypertension presents to the ER after patient states she ingested 60 tablets of 10 mg amlodipine  with ongoing suicidal thoughts and subsequently called EMS.  Patient found to be profoundly hyperglycemic meeting requirement for DKA.  Hospitalist called for admission, psychiatry called in consult.  Assessment & Plan:   Principal Problem:   DKA (diabetic ketoacidosis) (HCC) Active Problems:   Hypertension   Suicide attempt (HCC)   MDD (major depressive disorder), recurrent episode, severe (HCC)   DKA, type 2 (HCC)   Hypokalemia   ARF (acute renal failure) (HCC)  Uncontrolled diabetes mellitus type 2 with elevated anion gap acidosis consistent with DKA.   -Anion gap closed -DC insulin  infusion/IV fluids -Advance diet, initiate long-acting insulin  -titrate appropriately. Insulin  infusion   Hypokalemia -Likely secondary to poor p.o. intake exacerbated by above -Repleted, follow repeat labs  Drug overdose with suicidal thoughts  Reports to have taken 60 tablets of 10 mg amlodipine  - BP remains elevated Poison control requested observation for any hypotensive or bradycardia which has not been an issue since intake. - Psychiatry following, patient remains under IVC  History of depression  - Recently admitted for suicidal ideation on Abilify , sertraline , clonazepam   - See above  AKI without history of CKD, resolved -secondary to poor p.o. intake  DVT prophylaxis: enoxaparin  (LOVENOX ) injection 40 mg Start: 07/28/24 1000   Code Status:   Code Status: Full Code  Family Communication: None present  Status is: Inpatient  Dispo: The patient is from: Home              Anticipated d/c is to: To be determined              Anticipated d/c date is: To be determined               Patient currently not medically stable for discharge  Consultants:  Psychiatry  Procedures:  None  Antimicrobials:  None  Subjective: No acute issues or events overnight denies nausea vomiting diarrhea constipation headache fevers chills or chest pain  Objective: Vitals:   07/28/24 0500 07/28/24 0545 07/28/24 0600 07/28/24 0700  BP: (!) 158/45  (!) 171/72   Pulse: 99  96 (!) 101  Resp:    (!) 30  Temp:      TempSrc:      SpO2: 91% (!) 87% (!) 87% 94%  Weight:      Height:        Intake/Output Summary (Last 24 hours) at 07/28/2024 0705 Last data filed at 07/28/2024 0701 Gross per 24 hour  Intake 2335.58 ml  Output --  Net 2335.58 ml   Filed Weights   07/28/24 0234  Weight: 123.1 kg    Examination:  General exam: Appears calm and comfortable  Respiratory system: Clear to auscultation. Respiratory effort normal. Cardiovascular system: S1 & S2 heard, RRR. No JVD, murmurs, rubs, gallops or clicks. No pedal edema. Gastrointestinal system: Abdomen is nondistended, soft and nontender. No organomegaly or masses felt. Normal bowel sounds heard. Central nervous system: Alert and oriented. No focal neurological deficits. Extremities: Symmetric 5 x 5 power. Skin: No rashes, lesions or ulcers Psychiatry: Judgement and insight appear normal. Mood & affect appropriate.     Data Reviewed: I have personally reviewed following labs and imaging studies  CBC: Recent Labs  Lab 07/27/24 1612  WBC 11.8*  NEUTROABS 9.4*  HGB 14.9  HCT 44.7  MCV 85.1  PLT 273   Basic Metabolic Panel: Recent Labs  Lab 07/27/24 1612 07/28/24 0117 07/28/24 0417  NA 136 137 137  K 2.8* 2.7* 2.7*  CL 97* 100 102  CO2 18* 20* 22  GLUCOSE 411* 234* 176*  BUN 16 18 16   CREATININE 1.15* 1.04* 0.83  CALCIUM 9.1 8.8* 8.5*  MG  --   --  2.1  PHOS  --   --  2.2*   GFR: Estimated Creatinine Clearance: 104.2 mL/min (by C-G formula based on SCr of 0.83 mg/dL). Liver Function  Tests: Recent Labs  Lab 07/28/24 0116  AST 29  ALT 17  ALKPHOS 109  BILITOT 0.8  PROT 6.4*  ALBUMIN 3.7   No results for input(s): LIPASE, AMYLASE in the last 168 hours. No results for input(s): AMMONIA in the last 168 hours. Coagulation Profile: No results for input(s): INR, PROTIME in the last 168 hours. Cardiac Enzymes: No results for input(s): CKTOTAL, CKMB, CKMBINDEX, TROPONINI in the last 168 hours. BNP (last 3 results) No results for input(s): PROBNP in the last 8760 hours. HbA1C: No results for input(s): HGBA1C in the last 72 hours. CBG: Recent Labs  Lab 07/28/24 0238 07/28/24 0342 07/28/24 0450 07/28/24 0559 07/28/24 0657  GLUCAP 154* 159* 173* 167* 159*   Lipid Profile: No results for input(s): CHOL, HDL, LDLCALC, TRIG, CHOLHDL, LDLDIRECT in the last 72 hours. Thyroid Function Tests: No results for input(s): TSH, T4TOTAL, FREET4, T3FREE, THYROIDAB in the last 72 hours. Anemia Panel: No results for input(s): VITAMINB12, FOLATE, FERRITIN, TIBC, IRON, RETICCTPCT in the last 72 hours. Sepsis Labs: Recent Labs  Lab 07/28/24 0417  LATICACIDVEN 1.3    Recent Results (from the past 240 hours)  MRSA Next Gen by PCR, Nasal     Status: None   Collection Time: 07/28/24  2:06 AM   Specimen: Nasal Mucosa; Nasal Swab  Result Value Ref Range Status   MRSA by PCR Next Gen NOT DETECTED NOT DETECTED Final    Comment: (NOTE) The GeneXpert MRSA Assay (FDA approved for NASAL specimens only), is one component of a comprehensive MRSA colonization surveillance program. It is not intended to diagnose MRSA infection nor to guide or monitor treatment for MRSA infections. Test performance is not FDA approved in patients less than 37 years old. Performed at [facility], [address] [place], [place] [address]          Radiology Studies: DG Chest 2 View Result Date: 07/27/2024 CLINICAL DATA:   Chest pain and shortness of breath. EXAM: CHEST - 2 VIEW COMPARISON:  None Available. FINDINGS: The heart size and mediastinal contours are within normal limits. Both lungs are clear. The visualized skeletal structures are unremarkable. IMPRESSION: No active cardiopulmonary disease. Electronically Signed   By: Lynwood Landy Raddle M.D.   On: 07/27/2024 16:53    Scheduled Meds:  ARIPiprazole   15 mg Oral Daily   Chlorhexidine  Gluconate Cloth  6 each Topical QHS   clonazePAM   1 mg Oral TID   enoxaparin  (LOVENOX ) injection  40 mg Subcutaneous Q24H   insulin  aspart  0-15 Units Subcutaneous TID WC   insulin  aspart  0-5 Units Subcutaneous QHS   pantoprazole   40 mg Oral Daily   potassium & sodium phosphates   2 packet Oral Q4H   sertraline   200 mg Oral Daily   Continuous Infusions:  dextrose  5%  lactated ringers  125 mL/hr at 07/28/24 0701   insulin  3.4 Units/hr (07/28/24 0701)   lactated ringers      potassium chloride  100 mL/hr at 07/28/24 0701     LOS: 0 days   Time spent:  Elsie JAYSON Montclair, DO [facility]  If 7PM-7AM, please contact night-coverage [email or web address]  07/28/2024, 7:05 AM

## 2024-07-29 ENCOUNTER — Inpatient Hospital Stay (HOSPITAL_COMMUNITY)
Admission: AD | Admit: 2024-07-29 | Discharge: 2024-08-05 | DRG: 918 | Disposition: A | Discharge status: Home / Self Care | Source: Intra-hospital

## 2024-07-29 DIAGNOSIS — F419 Anxiety disorder, unspecified: Secondary | ICD-10-CM | POA: Diagnosis present

## 2024-07-29 DIAGNOSIS — Z8249 Family history of ischemic heart disease and other diseases of the circulatory system: Secondary | ICD-10-CM | POA: Diagnosis not present

## 2024-07-29 DIAGNOSIS — Z9151 Personal history of suicidal behavior: Secondary | ICD-10-CM

## 2024-07-29 DIAGNOSIS — F17293 Nicotine dependence, other tobacco product, with withdrawal: Secondary | ICD-10-CM | POA: Diagnosis present

## 2024-07-29 DIAGNOSIS — F332 Major depressive disorder, recurrent severe without psychotic features: Principal | ICD-10-CM | POA: Diagnosis present

## 2024-07-29 DIAGNOSIS — E119 Type 2 diabetes mellitus without complications: Secondary | ICD-10-CM | POA: Diagnosis present

## 2024-07-29 DIAGNOSIS — Z5941 Food insecurity: Secondary | ICD-10-CM

## 2024-07-29 DIAGNOSIS — Z5948 Other specified lack of adequate food: Secondary | ICD-10-CM | POA: Diagnosis not present

## 2024-07-29 DIAGNOSIS — Z91199 Patient's noncompliance with other medical treatment and regimen due to unspecified reason: Secondary | ICD-10-CM | POA: Diagnosis not present

## 2024-07-29 DIAGNOSIS — I10 Essential (primary) hypertension: Secondary | ICD-10-CM | POA: Diagnosis present

## 2024-07-29 DIAGNOSIS — Z79899 Other long term (current) drug therapy: Secondary | ICD-10-CM

## 2024-07-29 DIAGNOSIS — Z7984 Long term (current) use of oral hypoglycemic drugs: Secondary | ICD-10-CM

## 2024-07-29 DIAGNOSIS — E876 Hypokalemia: Secondary | ICD-10-CM | POA: Diagnosis not present

## 2024-07-29 DIAGNOSIS — Y92009 Unspecified place in unspecified non-institutional (private) residence as the place of occurrence of the external cause: Secondary | ICD-10-CM

## 2024-07-29 DIAGNOSIS — E1110 Type 2 diabetes mellitus with ketoacidosis without coma: Secondary | ICD-10-CM | POA: Diagnosis not present

## 2024-07-29 DIAGNOSIS — Z716 Tobacco abuse counseling: Secondary | ICD-10-CM

## 2024-07-29 DIAGNOSIS — Z23 Encounter for immunization: Secondary | ICD-10-CM

## 2024-07-29 DIAGNOSIS — T461X2A Poisoning by calcium-channel blockers, intentional self-harm, initial encounter: Principal | ICD-10-CM | POA: Diagnosis present

## 2024-07-29 DIAGNOSIS — Z5982 Transportation insecurity: Secondary | ICD-10-CM

## 2024-07-29 DIAGNOSIS — Z794 Long term (current) use of insulin: Secondary | ICD-10-CM

## 2024-07-29 DIAGNOSIS — Z818 Family history of other mental and behavioral disorders: Secondary | ICD-10-CM | POA: Diagnosis not present

## 2024-07-29 DIAGNOSIS — Z6841 Body Mass Index (BMI) 40.0 and over, adult: Secondary | ICD-10-CM

## 2024-07-29 DIAGNOSIS — N179 Acute kidney failure, unspecified: Secondary | ICD-10-CM | POA: Diagnosis not present

## 2024-07-29 LAB — GLUCOSE, CAPILLARY
Glucose-Capillary: 185 mg/dL — ABNORMAL HIGH (ref 70–99)
Glucose-Capillary: 168 mg/dL — ABNORMAL HIGH (ref 70–99)
Glucose-Capillary: 239 mg/dL — ABNORMAL HIGH (ref 70–99)
Glucose-Capillary: 226 mg/dL — ABNORMAL HIGH (ref 70–99)

## 2024-07-29 MED ORDER — PANTOPRAZOLE SODIUM 40 MG PO TBEC
40.0000 mg | DELAYED_RELEASE_TABLET | Freq: Every day | ORAL | Status: DC
  Administered 2024-07-30 – 2024-08-05 (×7): 40 mg via ORAL
  Filled 2024-07-29 (×7): qty 1

## 2024-07-29 MED ORDER — ACETAMINOPHEN 500 MG PO TABS
500.0000 mg | ORAL_TABLET | Freq: Four times a day (QID) | ORAL | Status: DC | PRN
Start: 2024-07-29 — End: 2024-07-29
  Administered 2024-07-29 (×4): 500 mg via ORAL
  Filled 2024-07-29 (×3): qty 1

## 2024-07-29 MED ORDER — ALUM & MAG HYDROXIDE-SIMETH 200-200-20 MG/5ML PO SUSP: 30.0000 mL | ORAL | Status: DC | PRN

## 2024-07-29 MED ORDER — ACETAMINOPHEN 500 MG PO TABS
500.0000 mg | ORAL_TABLET | Freq: Once | ORAL | Status: AC
  Administered 2024-07-29: 500 mg via ORAL
  Filled 2024-07-29: qty 1

## 2024-07-29 MED ORDER — INSULIN GLARGINE 100 UNIT/ML ~~LOC~~ SOLN
10.0000 [IU] | Freq: Two times a day (BID) | SUBCUTANEOUS | Status: DC
  Administered 2024-07-30 – 2024-08-05 (×13): 10 [IU] via SUBCUTANEOUS
  Filled 2024-07-29 (×7): qty 0.1

## 2024-07-29 MED ORDER — INSULIN GLARGINE 100 UNIT/ML ~~LOC~~ SOLN: 20.0000 [IU] | Freq: Every day | SUBCUTANEOUS | 0 refills | Status: DC

## 2024-07-29 MED ORDER — NICOTINE 7 MG/24HR TD PT24: 7.0000 mg | MEDICATED_PATCH | Freq: Every day | TRANSDERMAL | 0 refills | Status: AC

## 2024-07-29 MED ORDER — INSULIN ASPART 100 UNIT/ML IJ SOLN
0.0000 [IU] | Freq: Three times a day (TID) | INTRAMUSCULAR | Status: DC
  Administered 2024-07-30 – 2024-08-02 (×11): 3 [IU] via SUBCUTANEOUS
  Administered 2024-08-03: 2 [IU] via SUBCUTANEOUS
  Administered 2024-08-03: 3 [IU] via SUBCUTANEOUS
  Administered 2024-08-03: 2 [IU] via SUBCUTANEOUS
  Administered 2024-08-04 (×2): 3 [IU] via SUBCUTANEOUS
  Administered 2024-08-04: 2 [IU] via SUBCUTANEOUS
  Administered 2024-08-05: 3 [IU] via SUBCUTANEOUS

## 2024-07-29 MED ORDER — ACETAMINOPHEN 325 MG PO TABS
650.0000 mg | ORAL_TABLET | Freq: Once | ORAL | Status: AC
  Administered 2024-07-29: 650 mg via ORAL
  Filled 2024-07-29: qty 2

## 2024-07-29 MED ORDER — INSULIN ASPART 100 UNIT/ML IJ SOLN
0.0000 [IU] | Freq: Every day | INTRAMUSCULAR | Status: DC
  Administered 2024-07-31: 3 [IU] via SUBCUTANEOUS
  Administered 2024-08-02 – 2024-08-04 (×2): 2 [IU] via SUBCUTANEOUS

## 2024-07-29 MED ORDER — ARIPIPRAZOLE 15 MG PO TABS
15.0000 mg | ORAL_TABLET | Freq: Every day | ORAL | Status: DC
  Administered 2024-07-30 – 2024-08-05 (×7): 15 mg via ORAL
  Filled 2024-07-29 (×7): qty 1

## 2024-07-29 MED ORDER — ACETAMINOPHEN 325 MG PO TABS: 650.0000 mg | ORAL_TABLET | Freq: Four times a day (QID) | ORAL | Status: DC | PRN

## 2024-07-29 MED ORDER — CLONAZEPAM 0.5 MG PO TABS
1.0000 mg | ORAL_TABLET | Freq: Two times a day (BID) | ORAL | Status: DC
  Administered 2024-07-30 – 2024-08-05 (×13): 1 mg via ORAL
  Filled 2024-07-29 (×13): qty 2

## 2024-07-29 MED ORDER — MAGNESIUM HYDROXIDE 400 MG/5ML PO SUSP: 30.0000 mL | Freq: Every day | ORAL | Status: DC | PRN

## 2024-07-29 MED ORDER — NOVOLIN R FLEXPEN 100 UNIT/ML IJ SOPN: 15.0000 [IU] | PEN_INJECTOR | Freq: Three times a day (TID) | INTRAMUSCULAR | 0 refills | Status: DC

## 2024-07-29 MED ORDER — NICOTINE 14 MG/24HR TD PT24
14.0000 mg | MEDICATED_PATCH | Freq: Every day | TRANSDERMAL | Status: DC
  Administered 2024-07-30 – 2024-08-03 (×5): 14 mg via TRANSDERMAL
  Filled 2024-07-29 (×6): qty 1

## 2024-07-29 MED ORDER — OLANZAPINE 10 MG IM SOLR: 5.0000 mg | Freq: Three times a day (TID) | INTRAMUSCULAR | Status: DC | PRN

## 2024-07-29 MED ORDER — SERTRALINE HCL 100 MG PO TABS
200.0000 mg | ORAL_TABLET | Freq: Every day | ORAL | Status: DC
  Administered 2024-07-30 – 2024-08-05 (×7): 200 mg via ORAL
  Filled 2024-07-29 (×7): qty 2

## 2024-07-29 NOTE — Consult Note (Signed)
 [facility] Psychiatric Consult Follow Up  Patient Name: .Vanessa Barajas  MRN: [id number]  DOB: 06-29-1971  Consult Order details:  Orders (From admission, onward)     Start     Ordered   07/09/24 1321  IP CONSULT TO PSYCHIATRY       Ordering Provider: Ezzard Staci SAILOR, NP  Provider:  (Not yet assigned)  Question Answer Comment  Location [facility]   Reason for Consult? suicide attempt      07/09/24 1321             Mode of Visit: In person    Psychiatry Consult Evaluation  Service Date: July 29, 2024 LOS:  LOS: 1 day  Chief Complaint I've been depressed for about 3 weeks.  Primary Psychiatric Diagnoses  MDD, severe, without psychosis 2.  Intential overdose   Assessment  Vanessa Barajas is a 53 y.o. female admitted: Medicallyfor 07/27/2024  7:55 PM per Dr. Neda with PMH for DMT2 on insulin , GERD, Osteoarthritis, Knee Pain, HTN, Anxiety, Depression, morbid obesity class III who was BIB EMS with c/o intentional overdose to end her life.   Her current presentation of dysphoric mood, low energy and motivation, hopeless and worthless feelings with suicidal ideations is most consistent with MDD, severe. Current outpatient psychotropic medications include Abilify , Klonopin , and recently Ambien  and historically she has had a semi-positive response to these medications. She was non compliant with medications prior to admission as evidenced by self-report of not taking her medications since Wednesday. On initial examination, patient was depressed and tearful, financial concerns and recent loss of her job. Please see plan below for detailed recommendations.   Diagnoses:  Active Hospital problems: Principal Problem:   DKA (diabetic ketoacidosis) (HCC) Active Problems:   Major depressive disorder, recurrent severe without psychotic features (HCC)   Hypertension   Suicide attempt (HCC)   DKA, type 2 (HCC)   Hypokalemia   ARF (acute renal failure) (HCC)     Plan   ## Psychiatric Medication Recommendations:  MDD, severe, without psychosis: -Continue Zoloft  200 mg daily -Continue Abilify  10 mg daily  Insomnia: -Continue Ambien  5 mg at bedtime PRN sleep, do not give at the same time as PRN Klonopin   GAD: -Decreased Klonopin  that she reported of 3 mg daily to 1 mg BID since she has not taken it since Wednesday  ## Medical Decision Making Capacity: Not specifically addressed in this encounter  ## Further Work-up:  -- most recent EKG on 07/09/2024 had QtC of 467 -- Pertinent labwork reviewed earlier this admission includes: CBC, chem panel, toxicology, urine, EKG   ## Disposition:-- We recommend inpatient psychiatric hospitalization when medically cleared. Patient is under voluntary admission status at this time; please IVC if attempts to leave hospital.  ## Behavioral / Environmental: - No specific recommendations at this time.     ## Safety and Observation Level:  - Based on my clinical evaluation, I estimate the patient to be at high risk of self harm in the current setting. - At this time, we recommend  1:1 Observation. This decision is based on my review of the chart including patient's history and current presentation, interview of the patient, mental status examination, and consideration of suicide risk including evaluating suicidal ideation, plan, intent, suicidal or self-harm behaviors, risk factors, and protective factors. This judgment is based on our ability to directly address suicide risk, implement suicide prevention strategies, and develop a safety plan while the patient is in the clinical setting. Please contact our  team if there is a concern that risk level has changed.  CSSR Risk Category:C-SSRS RISK CATEGORY: High Risk  Suicide Risk Assessment: Patient has following modifiable risk factors for suicide: under treated depression  and current symptoms: anxiety/panic, insomnia, impulsivity, anhedonia, hopelessness, which we  are addressing by medication management and psychiatric admission after medical clearance. Patient has following non-modifiable or demographic risk factors for suicide: psychiatric hospitalization Patient has the following protective factors against suicide: Supportive friends, no history of suicide attempts, and no history of NSSIB  Thank you for this consult request. Recommendations have been communicated to the primary team.  We will continue to follow at this time.   Sharlot Becker, NP       History of Present Illness  Relevant Aspects of [facility] Course:  Admitted on 07/27/2024 per Dr. Neda with PMH for DMT2 on insulin , GERD, Osteoarthritis, Knee Pain, HTN, Anxiety, Depression, morbid obesity class III who was BIB EMS with c/o intentional overdose to end her life.   Patient Report:  53 yo female presented after an intentional overdose in a suicide attempt.  She stated she has been really depressed for the past 3 weeks since losing her job.  Jesus been depressed for about 3 weeks.  I realized I couldn't pay my bills or rent because I took a week off to take care of my granddaughter when my daughter went to [place].  I was 50 hours over vacation time was realized I wasn't going to get paid.  I got really depressed and didn't go to work the next week.  I'm going to lose everything and my only way out r/t suicide.  Low motivation and energy, hopeless and worthless feelings with dysphoric mood for 3 weeks.  Denies previous suicide attempts.  Admitted at [facility] last year for depression and suicidal ideations.  Anxiety is very high with constant worry that she cannot control, restlessness, and mind racing at times.  Denies trauma and mania symptoms, past and present.  No hallucinations, paranoia, or substance abuse except nicotine  vaping daily.  Her sleep has been poor with 4-6 hours per night, recently prescribed Ambien  5 mg at bedtime PRN sleep.  I haven't taken my meds since  Wednesday.  Her appetite is good.  Support system of one friend.    Discussed psychiatric admission after medical clearance, no objections.  Her medications are managed by her PCP, no therapy.  07/28/2024: Today the client rated her depression as kinda mild yet on the fence about being glad the suicide attempt did not work.  She has been stressed with not working since 8/18 and unable to pay her bills, no support in the area, lives alone.  Her sleep was on and off last night which is typical for her.  Appetite is good.  Anxiety is high with constant worry.  She is agreeable to transfer to inpatient psychiatry once she is medically stable.  07/29/2024: At the time the evaluation was initiated, the client was lying on her left side in bed with no indication of distress noted. When asked how she was doing, she became tearful, stating, I feel like I'm in a hole I can't get out of. She still endorses suicidal ideation, stating, I have no way out; I have no support. The client states that she is anxious about her transfer, but is glad to be going. She reports that she feels slightly less depressed and anxious today than yesterday.   Vanessa Barajas reports that her sleep and appetite are  decent.  Psych ROS:  Depression: high Anxiety:  very high Mania (lifetime and current): none Psychosis: (lifetime and current): none  Review of Systems  Psychiatric/Behavioral:  Positive for depression and suicidal ideas. The patient is nervous/anxious and has insomnia.   All other systems reviewed and are negative.    Psychiatric and Social History  Psychiatric History:  Information collected from patient  Prev Dx/Sx: depression Current Psych Provider: [facility]. [facility] Meds (current): Abilify , Klonopin , Zoloft , and Ambien  Previous Med Trials: unknown Therapy: none  Prior Psych Hospitalization: one at [facility] in [facility]24 Prior Self Harm: none Prior Violence: none  Family Psych History:  none Family Hx suicide: none  Social History:  Educational Hx: completed high school Occupational Hx: recently fired from Nurse, adult Hx: none Living Situation: lives alone  Access to weapons/lethal means: denied   Substance History Denies substance abuse other than vaping nicotine  daily  Exam Findings  Physical Exam: completed by MD, reviewed by this provider. Vital Signs:  Temp:  [98 F (36.7 C)-98.9 F (37.2 C)] 98 F (36.7 C) (09/19 9362) Pulse Rate:  [95-104] 100 (09/19 0637) Resp:  [15-24] 16 (09/19 0637) BP: (115-143)/(57-81) 143/81 (09/19 0637) SpO2:  [91 %-96 %] 95 % (09/19 0637) Blood pressure (!) 143/81, pulse 100, temperature 98 F (36.7 C), temperature source Oral, resp. rate 16, height 5' 7 (1.702 m), weight 123.1 kg, SpO2 95%. Body mass index is 42.51 kg/m.  Physical Exam  Mental Status Exam: General Appearance: Disheveled  Orientation:  Full (Time, Place, and Person)  Memory:  Immediate;   Good Recent;   Good Remote;   Good  Concentration:  Concentration: Fair and Attention Span: Fair  Recall:  Good  Attention  Fair  Eye Contact:  Fair  Speech:  Clear and Coherent  Language:  Good  Volume:  Normal  Mood: depressed, anxious  Affect:  Congruent  Thought Process:  Coherent  Thought Content:  Logical  Suicidal Thoughts:  Yes.  without intent/plan  Homicidal Thoughts:  No  Judgement:  Poor  Insight:  Fair  Psychomotor Activity:  Decreased  Akathisia:  No  Fund of Knowledge:  Fair      Assets:  Housing Leisure Time Resilience  Cognition:  WNL  ADL's:  Impaired  AIMS (if indicated):        Other History   These have been pulled in through the EMR, reviewed, and updated if appropriate.  Family History:  The patient's family history includes Cancer in her maternal grandmother and sister; Cancer (age of onset: 20) in her mother; Hypertension in her mother; Leukemia in her mother.  Medical History: Past Medical History:  Diagnosis Date    Anxiety    Diabetes mellitus without complication (HCC)    Hemorrhoids    Hypertension     Surgical History: Past Surgical History:  Procedure Laterality Date   ACHILLES TENDON SURGERY     ADENOIDECTOMY     ECTOPIC PREGNANCY SURGERY     x2   HERNIA REPAIR     TONSILLECTOMY     TUBAL LIGATION       Medications:   Current Facility-Administered Medications:    acetaminophen  (TYLENOL ) tablet 500 mg, 500 mg, Oral, Q6H PRN, Chavez, Abigail, NP, 500 mg at 07/29/24 9366   ARIPiprazole  (ABILIFY ) tablet 15 mg, 15 mg, Oral, Daily, Franky Redia SAILOR, MD, 15 mg at 07/29/24 9160   Chlorhexidine  Gluconate Cloth 2 % PADS 6 each, 6 each, Topical, QHS, Franky Redia SAILOR, MD, 6  each at 07/28/24 0219   clonazePAM  (KLONOPIN ) tablet 1 mg, 1 mg, Oral, TID, Franky Redia SAILOR, MD, 1 mg at 07/29/24 0839   dextrose  50 % solution 0-50 mL, 0-50 mL, Intravenous, PRN, Franky Redia SAILOR, MD   enoxaparin  (LOVENOX ) injection 60 mg, 60 mg, Subcutaneous, Daily, Shade, Christine E, RPH, 60 mg at 07/29/24 9161   insulin  aspart (novoLOG ) injection 0-15 Units, 0-15 Units, Subcutaneous, TID WC, Chavez, Abigail, NP, 3 Units at 07/29/24 9161   insulin  aspart (novoLOG ) injection 0-5 Units, 0-5 Units, Subcutaneous, QHS, Chavez, Abigail, NP, 2 Units at 07/28/24 2133   insulin  glargine (LANTUS ) injection 10 Units, 10 Units, Subcutaneous, BID, Lue Elsie BROCKS, MD, 10 Units at 07/29/24 9161   nicotine  (NICODERM CQ  - dosed in mg/24 hours) patch 14 mg, 14 mg, Transdermal, Daily, Chavez, Abigail, NP, 14 mg at 07/28/24 1948   Oral care mouth rinse, 15 mL, Mouth Rinse, PRN, Franky Redia SAILOR, MD   pantoprazole  (PROTONIX ) EC tablet 40 mg, 40 mg, Oral, Daily, Franky Redia SAILOR, MD, 40 mg at 07/29/24 9160   sertraline  (ZOLOFT ) tablet 200 mg, 200 mg, Oral, Daily, Franky Redia SAILOR, MD, 200 mg at 07/29/24 9160  Allergies: Allergies  Allergen Reactions   Lisinopril Swelling    Angioedema    Losartan  Swelling    Sharlot Becker, NP

## 2024-07-29 NOTE — Plan of Care (Signed)
   Problem: Metabolic: Goal: Ability to maintain appropriate glucose levels will improve Outcome: Progressing   Problem: Skin Integrity: Goal: Risk for impaired skin integrity will decrease Outcome: Progressing

## 2024-07-29 NOTE — Plan of Care (Signed)
   Problem: Education: Goal: Ability to describe self-care measures that may prevent or decrease complications (Diabetes Survival Skills Education) will improve Outcome: Progressing   Problem: Coping: Goal: Ability to adjust to condition or change in health will improve Outcome: Progressing   Problem: Fluid Volume: Goal: Ability to maintain a balanced intake and output will improve Outcome: Progressing

## 2024-07-29 NOTE — Progress Notes (Signed)
 Report called to Prentice Angle, RN at [facility].

## 2024-07-29 NOTE — Discharge Summary (Signed)
 Physician Discharge Summary  [name] FMW:[id number] DOB: [birth year] DOA: 07/27/2024  PCP: [name], MD  Admit date: 07/27/2024 Discharge date: 07/29/2024  Admitted From: Home Disposition: Inpatient psych  Recommendations for Outpatient Follow-up:  Follow up with PCP in 1-2 weeks  Discharge Condition: Stable CODE STATUS: Full Diet recommendation: Low-carb diabetic diet  Brief/Interim Summary: Vanessa Barajas is a 53 y.o. female with history of depression and anxiety, diabetes mellitus type 2, hypertension presents to the ER after patient states she ingested 60 tablets of 10 mg amlodipine  with ongoing suicidal thoughts and subsequently called EMS.  Patient found to be profoundly hyperglycemic meeting requirement for DKA.  Hospitalist called for admission, psychiatry called in consult.  Patient mated as above with reported intentional overdose of amlodipine  with suicidal ideation, also noted to be in DKA with profound hyperglycemia and anion gap.  Patient did not experience any dysrhythmias or hypotension, per anion gap now closed.  She is off IV fluids and IV insulin  tolerating p.o. well on chronic long-acting insulin .  Multiple medications discontinued as below given concern for recent behavior.  Defer to psychiatry for any further medication changes.  At this time she is otherwise stable and agreeable for discharge to inpatient psychiatry per their recommendations.  Request follow-up with PCP in 1 to 2 weeks after discharge for ongoing medical management and titration of her diabetes medications.    Discharge Diagnoses:  Principal Problem:   DKA (diabetic ketoacidosis) (HCC) Active Problems:   Hypertension   Suicide attempt (HCC)   Major depressive disorder, recurrent severe without psychotic features (HCC)   DKA, type 2 (HCC)   Hypokalemia   ARF (acute renal failure) (HCC)    Discharge Instructions   Allergies as of 07/29/2024       Reactions   Lisinopril Swelling    Angioedema   Losartan Swelling        Medication List     STOP taking these medications    amLODipine  10 MG tablet Commonly known as: NORVASC    diclofenac 75 MG EC tablet Commonly known as: VOLTAREN   Jardiance  25 MG Tabs tablet Generic drug: empagliflozin    oxyCODONE -acetaminophen  5-325 MG tablet Commonly known as: PERCOCET/ROXICET   zolpidem  10 MG tablet Commonly known as: AMBIEN        TAKE these medications    ARIPiprazole  15 MG tablet Commonly known as: ABILIFY  Take 1 tablet (15 mg total) by mouth daily.   clonazePAM  1 MG tablet Commonly known as: KLONOPIN  Take 1 tablet (1 mg total) by mouth 3 (three) times daily for 7 days.   fluticasone  50 MCG/ACT nasal spray Commonly known as: FLONASE  Place 2 sprays into both nostrils daily as needed for allergies or rhinitis.   glipiZIDE  5 MG 24 hr tablet Commonly known as: GLUCOTROL  XL Take 1 tablet (5 mg total) by mouth daily with breakfast.   insulin  glargine 100 UNIT/ML injection Commonly known as: LANTUS  Inject 0.2 mLs (20 Units total) into the skin daily. What changed: how much to take   nicotine  7 mg/24hr patch Commonly known as: NICODERM CQ  - dosed in mg/24 hr Place 1 patch (7 mg total) onto the skin daily. Start taking on: July 30, 2024   NovoLIN  R FlexPen ReliOn 100 UNIT/ML FlexPen Generic drug: Insulin  Regular Human Inject 15 Units into the skin 3 (three) times daily. What changed: how much to take   pantoprazole  40 MG tablet Commonly known as: PROTONIX  Take 40 mg by mouth daily.   sertraline  100 MG tablet Commonly  known as: ZOLOFT  Take 2 tablets (200 mg total) by mouth daily.        Allergies  Allergen Reactions   Lisinopril Swelling    Angioedema    Losartan Swelling    Consultations: Psychiatry   Procedures/Studies: DG Chest 2 View Result Date: 07/27/2024 CLINICAL DATA:  Chest pain and shortness of breath. EXAM: CHEST - 2 VIEW COMPARISON:  None Available. FINDINGS: The  heart size and mediastinal contours are within normal limits. Both lungs are clear. The visualized skeletal structures are unremarkable. IMPRESSION: No active cardiopulmonary disease. Electronically Signed   By: Lynwood Landy Raddle M.D.   On: 07/27/2024 16:53     Subjective: No acute issues or events overnight   Discharge Exam: Vitals:   07/29/24 0148 07/29/24 0637  BP: (!) 133/59 (!) 143/81  Pulse: 97 100  Resp: 16 16  Temp: 98.1 F (36.7 C) 98 F (36.7 C)  SpO2: 91% 95%   Vitals:   07/28/24 1720 07/28/24 2129 07/29/24 0148 07/29/24 0637  BP: (!) 117/57 130/64 (!) 133/59 (!) 143/81  Pulse: 95 97 97 100  Resp: 20 15 16 16   Temp: 98.7 F (37.1 C) 98.9 F (37.2 C) 98.1 F (36.7 C) 98 F (36.7 C)  TempSrc: Oral Oral Oral Oral  SpO2: 96% 95% 91% 95%  Weight:      Height:        General: Pt is alert, awake, not in acute distress Cardiovascular: RRR, S1/S2 +, no rubs, no gallops Respiratory: CTA bilaterally, no wheezing, no rhonchi Abdominal: Soft, NT, ND, bowel sounds + Extremities: no edema, no cyanosis    The results of significant diagnostics from this hospitalization (including imaging, microbiology, ancillary and laboratory) are listed below for reference.     Microbiology: Recent Results (from the past 240 hours)  MRSA Next Gen by PCR, Nasal     Status: None   Collection Time: 07/28/24  2:06 AM   Specimen: Nasal Mucosa; Nasal Swab  Result Value Ref Range Status   MRSA by PCR Next Gen NOT DETECTED NOT DETECTED Final    Comment: (NOTE) The GeneXpert MRSA Assay (FDA approved for NASAL specimens only), is one component of a comprehensive MRSA colonization surveillance program. It is not intended to diagnose MRSA infection nor to guide or monitor treatment for MRSA infections. Test performance is not FDA approved in patients less than 52 years old. Performed at [facility], [address], [place], [place] [address]      Labs: BNP (last 3  results) No results for input(s): BNP in the last 8760 hours. Basic Metabolic Panel: Recent Labs  Lab 07/28/24 0117 07/28/24 0417 07/28/24 0848 07/28/24 1252 07/28/24 1730  NA 137 137 135 136 135  K 2.7* 2.7* 3.7 4.3 4.7  CL 100 102 101 103 103  CO2 20* 22 21* 19* 19*  GLUCOSE 234* 176* 248* 298* 320*  BUN 18 16 12 14 14   CREATININE 1.04* 0.83 0.90 0.87 1.03*  CALCIUM 8.8* 8.5* 8.4* 9.2 8.7*  MG  --  2.1  --   --   --   PHOS  --  2.2*  --   --   --    Liver Function Tests: Recent Labs  Lab 07/28/24 0116  AST 29  ALT 17  ALKPHOS 109  BILITOT 0.8  PROT 6.4*  ALBUMIN 3.7   No results for input(s): LIPASE, AMYLASE in the last 168 hours. No results for input(s): AMMONIA in the last 168 hours.  CBC: Recent Labs  Lab 07/27/24 1612  WBC 11.8*  NEUTROABS 9.4*  HGB 14.9  HCT 44.7  MCV 85.1  PLT 273   Cardiac Enzymes: No results for input(s): CKTOTAL, CKMB, CKMBINDEX, TROPONINI in the last 168 hours. BNP: Invalid input(s): POCBNP CBG: Recent Labs  Lab 07/28/24 1135 07/28/24 1652 07/28/24 2124 07/29/24 0733 07/29/24 1134  GLUCAP 259* 327* 249* 185* 168*   D-Dimer No results for input(s): DDIMER in the last 72 hours. Hgb A1c No results for input(s): HGBA1C in the last 72 hours. Lipid Profile No results for input(s): CHOL, HDL, LDLCALC, TRIG, CHOLHDL, LDLDIRECT in the last 72 hours. Thyroid function studies No results for input(s): TSH, T4TOTAL, T3FREE, THYROIDAB in the last 72 hours.  Invalid input(s): FREET3 Anemia work up No results for input(s): VITAMINB12, FOLATE, FERRITIN, TIBC, IRON, RETICCTPCT in the last 72 hours. Urinalysis    Component Value Date/Time   COLORURINE RED (A) 03/29/2018 0906   APPEARANCEUR TURBID (A) 03/29/2018 0906   LABSPEC  03/29/2018 0906    TEST NOT REPORTED DUE TO COLOR INTERFERENCE OF URINE PIGMENT   PHURINE  03/29/2018 0906    TEST NOT REPORTED DUE TO COLOR  INTERFERENCE OF URINE PIGMENT   GLUCOSEU (A) 03/29/2018 0906    TEST NOT REPORTED DUE TO COLOR INTERFERENCE OF URINE PIGMENT   HGBUR (A) 03/29/2018 0906    TEST NOT REPORTED DUE TO COLOR INTERFERENCE OF URINE PIGMENT   BILIRUBINUR (A) 03/29/2018 0906    TEST NOT REPORTED DUE TO COLOR INTERFERENCE OF URINE PIGMENT   KETONESUR (A) 03/29/2018 0906    TEST NOT REPORTED DUE TO COLOR INTERFERENCE OF URINE PIGMENT   PROTEINUR (A) 03/29/2018 0906    TEST NOT REPORTED DUE TO COLOR INTERFERENCE OF URINE PIGMENT   UROBILINOGEN 0.2 07/31/2011 1333   NITRITE (A) 03/29/2018 0906    TEST NOT REPORTED DUE TO COLOR INTERFERENCE OF URINE PIGMENT   LEUKOCYTESUR (A) 03/29/2018 0906    TEST NOT REPORTED DUE TO COLOR INTERFERENCE OF URINE PIGMENT   Sepsis Labs Recent Labs  Lab 07/27/24 1612  WBC 11.8*   Microbiology Recent Results (from the past 240 hours)  MRSA Next Gen by PCR, Nasal     Status: None   Collection Time: 07/28/24  2:06 AM   Specimen: Nasal Mucosa; Nasal Swab  Result Value Ref Range Status   MRSA by PCR Next Gen NOT DETECTED NOT DETECTED Final    Comment: (NOTE) The GeneXpert MRSA Assay (FDA approved for NASAL specimens only), is one component of a comprehensive MRSA colonization surveillance program. It is not intended to diagnose MRSA infection nor to guide or monitor treatment for MRSA infections. Test performance is not FDA approved in patients less than 57 years old. Performed at [facility], [address], [place], [place] [address]      Time coordinating discharge: Over 30 minutes  SIGNED:   Elsie JAYSON Montclair, DO [facility] 07/29/2024, 12:00 PM Pager   If 7PM-7AM, please contact night-coverage [email or web address]

## 2024-07-29 NOTE — TOC Transition Note (Signed)
 Transition of Care [facility]) - Discharge Note   Patient Details  Name: Vanessa Barajas MRN: [id number] Date of Birth: 12-11-1970  [facility] CM/SW Contact:  NORMAN ASPEN, LCSW Phone Number: 07/29/2024, 1:37 PM   Clinical Narrative:     Pt medically cleared for dc today and bed available at [facility].  Pt aware she will dc today.  IVC paperwork uploaded to chart and packet left for [employer].  [employer] called at 1:30pm for transport.  No further IP CM needs.  Final next level of care: Psychiatric Hospital Barriers to Discharge: Barriers Resolved   Patient Goals and CMS Choice Patient states their goals for this hospitalization and ongoing recovery are:: Home CMS Medicare.gov Compare Post Acute Care list provided to:: Patient Choice offered to / list presented to : Patient [place] ownership interest in [facility].provided to:: Patient    Discharge Placement                       Discharge Plan and Services Additional resources added to the After Visit Summary for   In-house Referral: Clinical Social Work Discharge Planning Services: CM Consult Post Acute Care Choice: Durable Medical Equipment (RW)          DME Arranged: N/A DME Agency: NA       HH Arranged: NA HH Agency: NA        Social Drivers of Health (SDOH) Interventions SDOH Screenings   Food Insecurity: No Food Insecurity (07/28/2024)  Recent Concern: Food Insecurity - Food Insecurity Present (07/09/2024)  Housing: Low Risk  (07/28/2024)  Recent Concern: Housing - High Risk (07/09/2024)  Transportation Needs: No Transportation Needs (07/28/2024)  Recent Concern: Transportation Needs - Unmet Transportation Needs (07/09/2024)  Utilities: Not At Risk (07/28/2024)  Alcohol Screen: Low Risk  (07/10/2024)  Financial Resource Strain: Low Risk  (02/02/2024)   Received from [facility]  Physical Activity: Inactive (02/02/2024)   Received from [facility]  Social Connections: Socially Integrated (02/02/2024)    Received from [facility]  Stress: No Stress Concern Present (02/02/2024)   Received from [facility]  Tobacco Use: Medium Risk (07/28/2024)     Readmission Risk Interventions    07/28/2024    4:08 PM  Readmission Risk Prevention Plan  Post Dischage Appt Complete  Medication Screening Complete  Transportation Screening Complete

## 2024-07-29 NOTE — Progress Notes (Signed)
 Pt. Is getting transferred to [facility] with a [employer] officer.  Ivs removed

## 2024-07-30 ENCOUNTER — Encounter (HOSPITAL_COMMUNITY): Payer: Self-pay | Admitting: Psychiatry

## 2024-07-30 ENCOUNTER — Other Ambulatory Visit: Payer: Self-pay

## 2024-07-30 DIAGNOSIS — F332 Major depressive disorder, recurrent severe without psychotic features: Secondary | ICD-10-CM

## 2024-07-30 LAB — GLUCOSE, CAPILLARY
Glucose-Capillary: 170 mg/dL — ABNORMAL HIGH (ref 70–99)
Glucose-Capillary: 196 mg/dL — ABNORMAL HIGH (ref 70–99)
Glucose-Capillary: 155 mg/dL — ABNORMAL HIGH (ref 70–99)
Glucose-Capillary: 191 mg/dL — ABNORMAL HIGH (ref 70–99)

## 2024-07-30 MED ORDER — CLONIDINE HCL 0.1 MG PO TABS
0.2000 mg | ORAL_TABLET | Freq: Once | ORAL | Status: AC
  Administered 2024-07-30: 0.2 mg via ORAL
  Filled 2024-07-30: qty 2

## 2024-07-30 MED ORDER — OXYMETAZOLINE HCL 0.05 % NA SOLN
1.0000 | Freq: Two times a day (BID) | NASAL | Status: AC
  Administered 2024-07-30 (×2): 1 via NASAL
  Filled 2024-07-30: qty 30

## 2024-07-30 MED ORDER — INFLUENZA VIRUS VACC SPLIT PF (FLUZONE) 0.5 ML IM SUSY
0.5000 mL | PREFILLED_SYRINGE | INTRAMUSCULAR | Status: AC
  Administered 2024-08-02: 0.5 mL via INTRAMUSCULAR
  Filled 2024-07-30: qty 0.5

## 2024-07-30 MED ORDER — FLUTICASONE PROPIONATE 50 MCG/ACT NA SUSP
1.0000 | Freq: Every day | NASAL | Status: DC
  Administered 2024-07-30 – 2024-08-05 (×7): 1 via NASAL
  Filled 2024-07-30: qty 16

## 2024-07-30 NOTE — Tx Team (Signed)
 Initial Treatment Plan 07/30/2024 12:50 AM Aamya Bruschi FMW:[id number]    PATIENT STRESSORS: Financial difficulties   Health problems   Occupational concerns     PATIENT STRENGTHS: Average or above average intelligence  Communication skills  Work skills    PATIENT IDENTIFIED PROBLEMS: Depression  Suicidal Ideation          I'm just not sure         DISCHARGE CRITERIA:  Improved stabilization in mood, thinking, and/or behavior Medical problems require only outpatient monitoring Motivation to continue treatment in a less acute level of care Need for constant or close observation no longer present Verbal commitment to aftercare and medication compliance  PRELIMINARY DISCHARGE PLAN: Outpatient therapy Return to previous living arrangement  PATIENT/FAMILY INVOLVEMENT: This treatment plan has been presented to and reviewed with the patient, [employer] manager.  The patient and family have been given the opportunity to ask questions and make suggestions.  Effie Naomie Norris, RN 07/30/2024, 12:50 AM

## 2024-07-30 NOTE — Progress Notes (Signed)
 Pt is a 53 year old Caucasian female admitted due to suicidal ideation over job loss, and financial concerns.  Pt has been off work since August 18th and has no more time off, no pay.  Pt lives alone in an apartment.  Pt ambulates with difficulty with walker, has left knee that is bone on bone.  Pt could not walk from admission room to unit room, had to stop and wheelchair had to be brought to her.  Pt also has osteoarthritis, diabetes type 2 insulin  dependent, and has had low O2 sats in the high 80s, low 90s with oxygen when at [facility] [place].  Pt states she does not have history of COPD although she  does smoke vapes with nicotine .  NP  made aware of this as well as legs appear swollen and edematous.  Pt to be moved to hospital bed as soon as possible and O2 sats to be monitored every three hours.  (See VS flowsheet).  Pt cooperative and oriented to unit.  Will continue to closely monitor overnight.  Pt is high fall risk.    07/30/24 0028  Psych Admission Type (Psych Patients Only)  Admission Status Voluntary  Psychosocial Assessment  Patient Complaints None  Eye Contact Fair  Facial Expression Pained  Affect Appropriate to circumstance  Speech Logical/coherent  Interaction Assertive  Motor Activity Slow (Pt ambulates with difficulty, walker or wheelchair necessary)  Appearance/Hygiene Unremarkable  Behavior Characteristics Appropriate to situation  Mood Pleasant  Thought Process  Coherency WDL  Content WDL  Delusions None reported or observed  Perception WDL  Hallucination None reported or observed  Judgment Poor  Confusion None  Danger to Self  Current suicidal ideation? Passive  Self-Injurious Behavior No self-injurious ideation or behavior indicators observed or expressed   Agreement Not to Harm Self Yes  Description of Agreement verbal  Danger to Others  Danger to Others None reported or observed

## 2024-07-30 NOTE — Progress Notes (Signed)
 Pt O2 checked at 0100 and 0300 per NP order- holding at 90%, HR 101.  Will move Pt in AM to hospital bed, Pt propped up on three pillows for comfort.  No pain or distress noted.  NP aware, no new orders received at this time.  Will continue to closely monitor.

## 2024-07-30 NOTE — H&P (Signed)
 Psychiatric Admission Assessment Adult  Patient Identification: Vanessa Barajas  MRN:  [id number]  Date of Evaluation:  07/30/2024  Chief Complaint:  Major depressive disorder, recurrent severe without psychotic features (HCC) [F33.2]  Principal Diagnosis: Major depressive disorder, recurrent severe without psychotic features (HCC)  Diagnosis:  Principal Problem:   Major depressive disorder, recurrent severe without psychotic features (HCC)  History of Present Illness: This is the second psychiatric admission evaluation in the [facility] health system. Patient was admitted, treated & discharged from the [facility] behavioral unit on 07-16-24 after a suicide attempt on her insulin . Patient was discharged to follow-up care at the [facility] clinic. It is quite unknown if patient was keeping her psychiatric appointment or not. Patient is currently battling some chronic medical problems that included (obesity, DM, HTN). Per chart review, patient has not been compliant with her recommended treatment regimen. Vanessa Barajas is being admitted to the [facility] with complaint of suicide attempt by overdose on her Norvasc  tablets. After medical evaluation/stabilization, she was transferred to the [facility] for further psychiatric evaluation/treatments. During this evaluation, patient reports,   I was at the [facility] for two days prior to being transferred to this place. I had overdosed on my blood pressure pills called Norvasc . I did it on purpose to kill myself. I have been on medical leave from work. However, my PTO days are now used up. I could no longer pay my bills. This is the second time that I had overdosed in 3 weeks. I have been feeling very depressed for one year. The reason for my depression is because I feel alone. I have no support system. I have only one daughter, but we are estranged. I'm just weak & tired. I could not sleep at all last night. My sinuses feels clogged up. I desperately need the case walker so that I can  inquire about my rent & how I can get some help to pay it. I have no money for food. My depression currently is #10 & anxiety #10.   Objective: Patient presents alert. She is complaining of generalized weakness. She is breathing without distress. However, the staff reported a mildly low oxygen saturation this morning. Patient's blood & pulse rated are elevated. She is allergic to Losartan & Lisinopril. However, she overdosed on the Norvasc  pills recently which was the reason for her admission. Patient did express during her admission evaluation that her main need currently is to meet with the SW/case manager to discuss ways to pay her rent. She says she has used up all her PTO days at work as she remains on medical leave of absence. Patient was able to sit up in a wheel chair this morning for a good amount of time without any distress.   Associated Signs/Symptoms:  Depression Symptoms:  depressed mood, insomnia, fatigue, hopelessness, anxiety,  (Hypo) Manic Symptoms:  Impulsivity,  Anxiety Symptoms:  Excessive Worry,  Psychotic Symptoms:  Patient currently denies any AVH, delusional thoughts or paranoia.  PTSD Symptoms: NA  Total Time spent with patient: 1.5 hours  Past Psychiatric History:  Prev Dx/Sx: depression Current Psych Provider: DDr. Fonda Law Home Meds (current): Abilify , Klonopin , Zoloft , and Ambien  Previous Med Trials: unknown Therapy: none   Prior Psych Hospitalization: one at [facility] in 2024 Prior Self Harm: none Prior Violence: none  Is the patient at risk to self? No.  Has the patient been a risk to self in the past 6 months? Yes.    Has the patient been a risk to self  within the distant past? Yes.    Is the patient a risk to others? No.  Has the patient been a risk to others in the past 6 months? No.  Has the patient been a risk to others within the distant past? No.   [place] Scale:  Flowsheet Row [facility] ([facility]) [facility]osp-Admission (Discharged) from 07/27/2024 in [facility] [place] [facility] Admission (Discharged) from 07/10/2024 in [facility] BEHAVIORAL [facility]  C-SSRS RISK CATEGORY High Risk High Risk High Risk     Prior Inpatient Therapy: Yes.   If yes, describe: [facility].  Prior Outpatient Therapy: Yes.   If yes, describe: [facility].   Alcohol Screening: 1. How often do you have a drink containing alcohol?: Never 2. How many drinks containing alcohol do you have on a typical day when you are drinking?: 1 or 2 3. How often do you have six or more drinks on one occasion?: Never AUDIT-C Score: 0 4. How often during the last year have you found that you were not able to stop drinking once you had started?: Never 5. How often during the last year have you failed to do what was normally expected from you because of drinking?: Never 6. How often during the last year have you needed a first drink in the morning to get yourself going after a heavy drinking session?: Never 7. How often during the last year have you had a feeling of guilt of remorse after drinking?: Never 8. How often during the last year have you been unable to remember what happened the night before because you had been drinking?: Never 9. Have you or someone else been injured as a result of your drinking?: No 10. Has a relative or friend or a doctor or another health worker been concerned about your drinking or suggested you cut down?: No Alcohol Use Disorder Identification Test Final Score (AUDIT): 0 Alcohol Brief Interventions/Follow-up: Alcohol education/Brief advice  Substance Abuse History in the last 12 months:  No.  Consequences of Substance Abuse: NA  Previous Psychotropic Medications: Yes   Psychological Evaluations: Yes   Past Medical History:  Past Medical History:  Diagnosis Date   Anxiety    Diabetes mellitus without complication (HCC)    Hemorrhoids    Hypertension     Past Surgical  History:  Procedure Laterality Date   ACHILLES TENDON SURGERY     ADENOIDECTOMY     ECTOPIC PREGNANCY SURGERY     x2   HERNIA REPAIR     TONSILLECTOMY     TUBAL LIGATION     Family History:  Family History  Problem Relation Age of Onset   Leukemia Mother    Cancer Mother 23       leukemia   Hypertension Mother    Cancer Sister    Cancer Maternal Grandmother        breast   Family Psychiatric  History: Major depressive disorder: Sister.  Anxiety disorder: Sister.   Tobacco Screening:  Social History   Tobacco Use  Smoking Status Former   Current packs/day: 0.00   Types: Cigarettes   Quit date: 09/02/2013   Years since quitting: 10.9  Smokeless Tobacco Never    BH Tobacco Counseling     Are you interested in Tobacco Cessation Medications?  No, patient refused Counseled patient on smoking cessation:  Yes Reason Tobacco Screening Not Completed: No value filed.       Social History: Single,  has on daughter with strained relationship, lives alone, employed. Social History   Substance and Sexual Activity  Alcohol Use No     Social History   Substance and Sexual Activity  Drug Use No    Additional Social History:  Allergies:   Allergies  Allergen Reactions   Lisinopril Swelling    Angioedema    Losartan Swelling   Lab Results:  Results for orders placed or performed during the hospital encounter of 07/29/24 (from the past 48 hours)  Glucose, capillary     Status: Abnormal   Collection Time: 07/30/24  5:59 AM  Result Value Ref Range   Glucose-Capillary 170 (H) 70 - 99 mg/dL    Comment: Glucose reference range applies only to samples taken after fasting for at least 8 hours.   Comment 1 Notify RN    Comment 2 Document in Chart   Glucose, capillary     Status: Abnormal   Collection Time: 07/30/24 12:11 PM  Result Value Ref Range   Glucose-Capillary 196 (H) 70 - 99 mg/dL    Comment: Glucose reference range applies only to samples taken after fasting  for at least 8 hours.   Blood Alcohol level:  Lab Results  Component Value Date   [facility] <15 07/09/2024   Metabolic Disorder Labs:  Lab Results  Component Value Date   HGBA1C 11.5 (H) 07/13/2024   MPG 283 07/13/2024   MPG 280.48 07/10/2024   No results found for: PROLACTIN Lab Results  Component Value Date   CHOL 206 (H) 07/13/2024   TRIG 140 07/13/2024   HDL 40 (L) 07/13/2024   CHOLHDL 5.2 07/13/2024   VLDL 28 07/13/2024   LDLCALC 138 (H) 07/13/2024   LDLCALC UNABLE TO CALCULATE IF TRIGLYCERIDE OVER 400 mg/dL 90/78/7987   Current Medications: Current Facility-Administered Medications  Medication Dose Route Frequency Provider Last Rate Last Admin   acetaminophen  (TYLENOL ) tablet 650 mg  650 mg Oral Q6H PRN Lord, Jamison Y, NP       alum & mag hydroxide-simeth (MAALOX/MYLANTA) 200-200-20 MG/5ML suspension 30 mL  30 mL Oral Q4H PRN Jacquetta Sharlot GRADE, NP       ARIPiprazole  (ABILIFY ) tablet 15 mg  15 mg Oral Daily Jacquetta Sharlot GRADE, NP   15 mg at 07/30/24 9050   clonazePAM  (KLONOPIN ) tablet 1 mg  1 mg Oral BID Jacquetta Sharlot GRADE, NP   1 mg at 07/30/24 9050   fluticasone  (FLONASE ) 50 MCG/ACT nasal spray 1 spray  1 spray Each Nare Daily Bouchard, Marc A, DO   1 spray at 07/30/24 1012   [START ON 07/31/2024] influenza vac split trivalent PF (FLUZONE ) injection 0.5 mL  0.5 mL Intramuscular Tomorrow-1000 Bobbitt, Shalon E, NP       insulin  aspart (novoLOG ) injection 0-15 Units  0-15 Units Subcutaneous TID WC Jacquetta Sharlot GRADE, NP   3 Units at 07/30/24 1239   insulin  aspart (novoLOG ) injection 0-5 Units  0-5 Units Subcutaneous QHS Jacquetta Sharlot GRADE, NP       insulin  glargine (LANTUS ) injection 10 Units  10 Units Subcutaneous BID Jacquetta Sharlot GRADE, NP   10 Units at 07/30/24 9050   magnesium  hydroxide (MILK OF MAGNESIA) suspension 30 mL  30 mL Oral Daily PRN Jacquetta Sharlot GRADE, NP       nicotine  (NICODERM CQ  - dosed in mg/24 hours) patch 14 mg  14 mg Transdermal Daily Jacquetta Sharlot GRADE, NP   14 mg at 07/30/24  0949   OLANZapine  (ZYPREXA ) injection 5 mg  5 mg Intramuscular TID PRN Lord, Jamison Y, NP       oxymetazoline  (AFRIN) 0.05 % nasal spray 1 spray  1 spray Each Nare BID Prentis Kitchens A, DO   1 spray at 07/30/24 1012   pantoprazole  (PROTONIX ) EC tablet 40 mg  40 mg Oral Daily Jacquetta Sharlot GRADE, NP   40 mg at 07/30/24 9050   sertraline  (ZOLOFT ) tablet 200 mg  200 mg Oral Daily Lord, Jamison Y, NP   200 mg at 07/30/24 9050   PTA Medications: Medications Prior to Admission  Medication Sig Dispense Refill Last Dose/Taking   ARIPiprazole  (ABILIFY ) 15 MG tablet Take 1 tablet (15 mg total) by mouth daily. 30 tablet 0    clonazePAM  (KLONOPIN ) 1 MG tablet Take 1 tablet (1 mg total) by mouth 3 (three) times daily for 7 days. 21 tablet 0    fluticasone  (FLONASE ) 50 MCG/ACT nasal spray Place 2 sprays into both nostrils daily as needed for allergies or rhinitis.      glipiZIDE  (GLUCOTROL  XL) 5 MG 24 hr tablet Take 1 tablet (5 mg total) by mouth daily with breakfast. 30 tablet 0    insulin  glargine (LANTUS ) 100 UNIT/ML injection Inject 0.2 mLs (20 Units total) into the skin daily. 10 mL 0    nicotine  (NICODERM CQ  - DOSED IN MG/24 HR) 7 mg/24hr patch Place 1 patch (7 mg total) onto the skin daily. 30 patch 0    NOVOLIN  R FLEXPEN RELION 100 UNIT/ML FlexPen Inject 15 Units into the skin 3 (three) times daily. 15 mL 0    pantoprazole  (PROTONIX ) 40 MG tablet Take 40 mg by mouth daily.      sertraline  (ZOLOFT ) 100 MG tablet Take 2 tablets (200 mg total) by mouth daily. 60 tablet 0    AIMS:  ,  ,  ,  ,  ,  ,    Musculoskeletal: Strength & Muscle Tone: within normal limits Gait & Station: normal Patient leans: N/A  Psychiatric Specialty Exam:  Presentation  General Appearance:  Disheveled  Eye Contact: Fair  Speech: Clear and Coherent; Slow  Speech Volume: Normal  Handedness: Right   Mood and Affect  Mood: Anxious; Depressed; Hopeless  Affect: Congruent; Depressed   Thought Process   Thought Processes: Coherent; Goal Directed  Duration of Psychotic Symptoms:N/A  Past Diagnosis of Schizophrenia or Psychoactive disorder: No data recorded Descriptions of Associations:Intact  Orientation:Full (Time, Place and Person)  Thought Content:Logical  Hallucinations:Hallucinations: None  Ideas of Reference:None  Suicidal Thoughts:Suicidal Thoughts: No  Homicidal Thoughts:Homicidal Thoughts: No   Sensorium  Memory: Immediate Good; Recent Good; Remote Good  Judgment: Poor  Insight: Poor   Executive Functions  Concentration: Fair  Attention Span: Fair  Recall: Good  Fund of Knowledge: Fair  Language: Good   Psychomotor Activity  Psychomotor Activity:Psychomotor Activity: Decreased   Assets  Assets: Communication Skills; Desire for Improvement; Housing   Sleep  Sleep:Sleep: Fair  Estimated Sleeping Duration (Last 24 Hours): 2.75-3.75 hours  Physical Exam: Physical Exam Vitals and nursing note reviewed.  HENT:     Head: Normocephalic.  Pulmonary:     Effort: Pulmonary effort is normal.  Genitourinary:    Comments: Deferred Musculoskeletal:        General: Normal range of motion.     Cervical back: Normal range of motion.  Skin:    General: Skin is dry.  Neurological:     General: No focal deficit present.     Mental Status: She is alert and oriented  to person, place, and time.    Review of Systems  Constitutional:  Negative for chills, diaphoresis and fever.  HENT:  Negative for congestion and sore throat.   Respiratory:  Negative for cough, shortness of breath and wheezing.   Cardiovascular:  Negative for chest pain and palpitations.  Gastrointestinal:  Negative for abdominal pain, constipation, diarrhea, heartburn, nausea and vomiting.  Genitourinary:  Negative for dysuria.  Neurological:  Positive for weakness (Generalized.). Negative for dizziness, tingling, tremors, sensory change, speech change, focal weakness,  seizures, loss of consciousness and headaches.  Psychiatric/Behavioral:  Positive for depression. Negative for hallucinations, memory loss, substance abuse and suicidal ideas (Hx of attempt x 2.). The patient is nervous/anxious and has insomnia.    Blood pressure (!) 161/78, pulse (!) 103, temperature 98.5 F (36.9 C), temperature source Oral, resp. rate 19, height 5' 7 (1.702 m), weight 123.1 kg, SpO2 (!) 89%. Body mass index is 42.51 kg/m.  Treatment Plan Summary: Daily contact with patient to assess and evaluate symptoms and progress in treatment and Medication management.   Principal/active diagnoses.  Major depressive disorder, recurrent severe without psychotic features (HCC).   Medical. Diabetes mellitus.  HTN.  Plan: The risks/benefits/side-effects/alternatives to the medications in use were discussed in detail with the patient and time was given for patient's  questions. The patient consents to medication trial.   -Continue Abilify  15 mg po daily for mood control.  -Continue Clonazepam  1 mg po bid for anxiety.  -Continue Sertraline  200 mg po daily for depression.  -Continue Nicotine  patch 14 mg trans-dermally for nicotine  withdrawal.  Agitation protocols.  -Continue as recommended.   Medical:  -Continue Protonix  40 mg po Q am for indigestion.  -Continue The Afrin Nasal spray per each nare bid. -Continue Lantus  insulin  10 units bid for DM.  -Continue the sliding scale insulin  as recommended per blood sugar results.  Other PRNS -Continue Tylenol  650 mg every 6 hours PRN for mild pain -Continue Maalox 30 ml Q 4 hrs PRN for indigestion -Continue MOM 30 ml po Q 6 hrs for constipation.    Safety and Monitoring: Voluntary admission to inpatient psychiatric unit for safety, stabilization and treatment Daily contact with patient to assess and evaluate symptoms and progress in treatment Patient's case to be discussed in multi-disciplinary team meeting Observation Level :  q15 minute checks Vital signs: q12 hours Precautions: Safety  Discharge Planning: Social work and case management to assist with discharge planning and identification of hospital follow-up needs prior to discharge Estimated LOS: 5-7 days Discharge Concerns: Need to establish a safety plan; Medication compliance and effectiveness Discharge Goals: Return home with outpatient referrals for mental health follow-up including medication management/psychotherapy  Observation Level/Precautions:  15 minute checks  Laboratory:  Per ED. Current lab results reviewed.  Psychotherapy: Enrolled in group sessions   Medications:  See MAR.  Consultations: As needed.    Discharge Concerns: Safety, mood stability.   Estimated LOS: 3-5 days.  Other:  NA   Physician Treatment Plan for Primary Diagnosis: Major depressive disorder, recurrent severe without psychotic features (HCC)  Long Term Goal(s): Improvement in symptoms so as ready for discharge  Short Term Goals: Ability to identify changes in lifestyle to reduce recurrence of condition will improve, Ability to verbalize feelings will improve, Ability to disclose and discuss suicidal ideas, and Ability to demonstrate self-control will improve  Physician Treatment Plan for Secondary Diagnosis: Principal Problem:   Major depressive disorder, recurrent severe without psychotic features (HCC)  Long Term Goal(s): Improvement  in symptoms so as ready for discharge  Short Term Goals: Ability to identify and develop effective coping behaviors will improve, Ability to maintain clinical measurements within normal limits will improve, Compliance with prescribed medications will improve, and Ability to identify triggers associated with substance abuse/mental health issues will improve  I certify that inpatient services furnished can reasonably be expected to improve the patient's condition.    Mac Bolster, NP, pmhnp, fnp-bc. 9/20/20251:47 PM

## 2024-07-30 NOTE — Group Note (Signed)
 Date:  07/30/2024 Time:  3:36 PM  Group Topic/Focus: Sleep hygiene Wellness Toolbox:   The focus of this group is to discuss various aspects of wellness, balancing those aspects and exploring ways to increase the ability to experience wellness.  Patients will create a wellness toolbox for use upon discharge.    Participation Level:  Did Not Attend  Shanda JONETTA Challenger 07/30/2024, 3:36 PM
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
## 2024-07-30 NOTE — Plan of Care (Signed)
  Problem: Education: Goal: Knowledge of [place] [facility] Education information/materials will improve Outcome: Progressing Goal: Mental status will improve Outcome: Progressing Goal: Verbalization of understanding the information provided will improve Outcome: Progressing   Problem: Activity: Goal: Sleeping patterns will improve Outcome: Progressing

## 2024-07-30 NOTE — Progress Notes (Signed)
   07/30/24 0945  Psych Admission Type (Psych Patients Only)  Admission Status Voluntary  Psychosocial Assessment  Patient Complaints None  Eye Contact Fair  Facial Expression Pained  Affect Appropriate to circumstance  Speech Logical/coherent  Interaction Assertive  Motor Activity Slow  Appearance/Hygiene In scrubs  Behavior Characteristics Appropriate to situation  Mood Pleasant  Thought Process  Coherency WDL  Content WDL  Delusions None reported or observed  Perception WDL  Hallucination None reported or observed  Judgment Poor  Confusion None  Danger to Self  Current suicidal ideation? Passive  Self-Injurious Behavior No self-injurious ideation or behavior indicators observed or expressed   Agreement Not to Harm Self Yes  Description of Agreement verbal  Danger to Others  Danger to Others None reported or observed

## 2024-07-30 NOTE — Progress Notes (Signed)
(  Sleep Hours) - 3.5 hours (Any PRNs that were needed, meds refused, or side effects to meds)-  None (Any disturbances and when (visitation, over night)- None (Concerns raised by the patient)-  O2 sats 89, 90 throughout night (SI/HI/AVH)-  Denies

## 2024-07-30 NOTE — Plan of Care (Signed)
   Problem: Education: Goal: Knowledge of [place] [facility] Education information/materials will improve Outcome: Progressing

## 2024-07-30 NOTE — BHH Suicide Risk Assessment (Signed)
 Suicide Risk Assessment  Admission Assessment    [facility] Admission Suicide Risk Assessment   Nursing information obtained from:  Patient  Demographic factors:  Living alone, Unemployed, Caucasian  Current Mental Status:  Suicidal ideation indicated by others  Loss Factors:  Decrease in vocational status, Financial problems / change in socioeconomic status  Historical Factors:  NA  Risk Reduction Factors:  Responsible for children under 55 years of age, Positive social support  Total Time spent with patient: 1.5 hours  Principal Problem: Major depressive disorder, recurrent severe without psychotic features (HCC)  Diagnosis:  Principal Problem:   Major depressive disorder, recurrent severe without psychotic features (HCC)  Subjective Data: See H&P.  Continued Clinical Symptoms:  Alcohol Use Disorder Identification Test Final Score (AUDIT): 0 The Alcohol Use Disorders Identification Test, Guidelines for Use in Primary Care, Second Edition.  World Science writer [facility]). Score between 0-7:  no or low risk or alcohol related problems. Score between 8-15:  moderate risk of alcohol related problems. Score between 16-19:  high risk of alcohol related problems. Score 20 or above:  warrants further diagnostic evaluation for alcohol dependence and treatment.  CLINICAL FACTORS:   Depression:   Hopelessness Impulsivity Unstable or Poor Therapeutic Relationship Previous Psychiatric Diagnoses and Treatments Medical Diagnoses and Treatments/Surgeries  Musculoskeletal: Strength & Muscle Tone: within normal limits Gait & Station: normal, unsteady Patient leans: N/A  Psychiatric Specialty Exam:  Presentation  General Appearance:  Disheveled  Eye Contact: Fair  Speech: Clear and Coherent; Slow  Speech Volume: Normal  Handedness: Right   Mood and Affect  Mood: Anxious; Depressed; Hopeless  Affect: Congruent; Depressed   Thought Process  Thought  Processes: Coherent; Goal Directed  Descriptions of Associations:Intact  Orientation:Full (Time, Place and Person)  Thought Content:Logical  History of Schizophrenia/Schizoaffective disorder:No data recorded Duration of Psychotic Symptoms:No data recorded Hallucinations:Hallucinations: None  Ideas of Reference:None  Suicidal Thoughts:Suicidal Thoughts: No  Homicidal Thoughts:Homicidal Thoughts: No   Sensorium  Memory: Immediate Good; Recent Good; Remote Good  Judgment: Poor  Insight: Poor   Executive Functions  Concentration: Fair  Attention Span: Fair  Recall: Good  Fund of Knowledge: Fair  Language: Good   Psychomotor Activity  Psychomotor Activity:Psychomotor Activity: Decreased   Assets  Assets: Communication Skills; Desire for Improvement; Housing  Sleep  Sleep:Sleep: Fair Number of Hours of Sleep: 5.5  Physical Exam: See H&P.  Blood pressure (!) 161/78, pulse (!) 103, temperature 98.5 F (36.9 C), temperature source Oral, resp. rate 19, height 5' 7 (1.702 m), weight 123.1 kg, SpO2 (!) 89%. Body mass index is 42.51 kg/m.  COGNITIVE FEATURES THAT CONTRIBUTE TO RISK:  Closed-mindedness, Polarized thinking, and Thought constriction (tunnel vision)    SUICIDE RISK:   Severe:  Frequent, intense, and enduring suicidal ideation, specific plan, no subjective intent, but some objective markers of intent (i.e., choice of lethal method), the method is accessible, some limited preparatory behavior, evidence of impaired self-control, severe dysphoria/symptomatology, multiple risk factors present, and few if any protective factors, particularly a lack of social support.  PLAN OF CARE: See H&P.  I certify that inpatient services furnished can reasonably be expected to improve the patient's condition.   Mac Bolster, NP, pmhnp, fnp-bc. 07/30/2024, 11:55 AM

## 2024-07-31 LAB — GLUCOSE, CAPILLARY
Glucose-Capillary: 156 mg/dL — ABNORMAL HIGH (ref 70–99)
Glucose-Capillary: 170 mg/dL — ABNORMAL HIGH (ref 70–99)
Glucose-Capillary: 180 mg/dL — ABNORMAL HIGH (ref 70–99)
Glucose-Capillary: 287 mg/dL — ABNORMAL HIGH (ref 70–99)

## 2024-07-31 NOTE — Progress Notes (Signed)
   07/31/24 2230  Psych Admission Type (Psych Patients Only)  Admission Status Involuntary  Psychosocial Assessment  Patient Complaints Anxiety;Depression  Eye Contact Fair  Facial Expression Animated  Affect Appropriate to circumstance  Speech Logical/coherent  Interaction Assertive  Motor Activity Slow (utilizes walker for short distances, wheelchair for long)  Appearance/Hygiene In hospital gown  Behavior Characteristics Appropriate to situation  Mood Depressed  Thought Process  Coherency WDL  Content WDL  Delusions None reported or observed  Perception WDL  Hallucination None reported or observed  Judgment Poor  Confusion None  Danger to Self  Current suicidal ideation? Denies  Self-Injurious Behavior No self-injurious ideation or behavior indicators observed or expressed   Agreement Not to Harm Self Yes  Description of Agreement verbal  Danger to Others  Danger to Others None reported or observed

## 2024-07-31 NOTE — Group Note (Unsigned)
 Date:  07/31/2024 Time:  9:58 AM  Group Topic/Focus:  Goals Group:   The focus of this group is to help patients establish daily goals to achieve during treatment and discuss how the patient can incorporate goal setting into their daily lives to aide in recovery.     Participation Level:  {BHH PARTICIPATION OZCZO:77735}  Participation Quality:  {BHH PARTICIPATION QUALITY:22265}  Affect:  {BHH AFFECT:22266}  Cognitive:  {BHH COGNITIVE:22267}  Insight: {BHH Insight2:20797}  Engagement in Group:  {BHH ENGAGEMENT IN HMNLE:77731}  Modes of Intervention:  {BHH MODES OF INTERVENTION:22269}  Additional Comments:  ***  Vanessa Barajas 07/31/2024, 9:58 AM

## 2024-07-31 NOTE — Group Note (Unsigned)
 Date:  07/31/2024 Time:  10:53 AM  Group Topic/Focus:  Goals Group:   The focus of this group is to help patients establish daily goals to achieve during treatment and discuss how the patient can incorporate goal setting into their daily lives to aide in recovery.     Participation Level:  {BHH PARTICIPATION OZCZO:77735}  Participation Quality:  {BHH PARTICIPATION QUALITY:22265}  Affect:  {BHH AFFECT:22266}  Cognitive:  {BHH COGNITIVE:22267}  Insight: {BHH Insight2:20797}  Engagement in Group:  {BHH ENGAGEMENT IN HMNLE:77731}  Modes of Intervention:  {BHH MODES OF INTERVENTION:22269}  Additional Comments:  ***  Vanessa Barajas 07/31/2024, 10:53 AM

## 2024-07-31 NOTE — Group Note (Signed)
 Date:  07/31/2024 Time:  5:12 PM  Group Topic/Focus:  Self Care:   The focus of this group is to help patients understand the importance of sleep hygiene and self-care in order to improve or restore emotional, physical, spiritual, interpersonal, and financial health.    Additional Comments:  Patient did not attend  [name] 07/31/2024, 5:12 PM

## 2024-07-31 NOTE — Progress Notes (Signed)
   07/31/24 0002  Psych Admission Type (Psych Patients Only)  Admission Status Involuntary  Psychosocial Assessment  Patient Complaints None  Eye Contact Fair  Facial Expression Animated  Affect Appropriate to circumstance  Speech Logical/coherent  Interaction Assertive  Motor Activity Other (Comment);Slow  Appearance/Hygiene In hospital gown  Behavior Characteristics Appropriate to situation  Mood Pleasant  Thought Process  Coherency WDL  Content WDL  Delusions None reported or observed  Perception WDL  Hallucination None reported or observed  Judgment Poor  Confusion None  Danger to Self  Current suicidal ideation? Passive  Description of Suicide Plan N/A  Self-Injurious Behavior No self-injurious ideation or behavior indicators observed or expressed   Agreement Not to Harm Self Yes  Description of Agreement verbal  Danger to Others  Danger to Others None reported or observed

## 2024-07-31 NOTE — Group Note (Unsigned)
 Date:  07/31/2024 Time:  8:51 PM  Group Topic/Focus:  Wrap-Up Group:   The focus of this group is to help patients review their daily goal of treatment and discuss progress on daily workbooks.     Participation Level:  {BHH PARTICIPATION OZCZO:77735}  Participation Quality:  {BHH PARTICIPATION QUALITY:22265}  Affect:  {BHH AFFECT:22266}  Cognitive:  {BHH COGNITIVE:22267}  Insight: {BHH Insight2:20797}  Engagement in Group:  {BHH ENGAGEMENT IN HMNLE:77731}  Modes of Intervention:  {BHH MODES OF INTERVENTION:22269}  Additional Comments:  ***  Stephane Niemann A Prisha Hiley 07/31/2024, 8:51 PM

## 2024-07-31 NOTE — Group Note (Signed)
 Date:  07/31/2024 Time:  9:08 PM  Group Topic/Focus:  Wrap-Up Group:   The focus of this group is to help patients review their daily goal of treatment and discuss progress on daily workbooks.    Additional Comments:  Pt was encouraged, but opted out of attending wrap up group this evening.  Vanessa Barajas 07/31/2024, 9:08 PM

## 2024-07-31 NOTE — Group Note (Unsigned)
 Date:  07/31/2024 Time:  9:50 AM  Group Topic/Focus:  Goals Group:   The focus of this group is to help patients establish daily goals to achieve during treatment and discuss how the patient can incorporate goal setting into their daily lives to aide in recovery.     Participation Level:  {BHH PARTICIPATION OZCZO:77735}  Participation Quality:  {BHH PARTICIPATION QUALITY:22265}  Affect:  {BHH AFFECT:22266}  Cognitive:  {BHH COGNITIVE:22267}  Insight: {BHH Insight2:20797}  Engagement in Group:  {BHH ENGAGEMENT IN HMNLE:77731}  Modes of Intervention:  {BHH MODES OF INTERVENTION:22269}  Additional Comments:  ***  Vanessa Barajas 07/31/2024, 9:50 AM

## 2024-07-31 NOTE — Group Note (Signed)
 Date:  07/31/2024 Time:  10:22 AM  Group Topic/Focus:  Goals Group:   The focus of this group is to help patients establish daily goals to achieve during treatment and discuss how the patient can incorporate goal setting into their daily lives to aide in recovery.    Participation Level:  Did Not Attend  Participation Quality:  Did Not Attend  Affect:  Did Not Attend  Cognitive:  Did Not Attend  Insight: None  Engagement in Group:  Did Not Attend  Modes of Intervention:  Did Not Attend  Additional Comments:    Avelina DELENA Humphreys 07/31/2024, 10:22 AM

## 2024-07-31 NOTE — Progress Notes (Addendum)
(  Sleep Hours) - 10.75 hours (Any PRNs that were needed, meds refused, or side effects to meds)-  None (Any disturbances and when (visitation, over night)- None (Concerns raised by the patient)-  None (SI/HI/AVH)-  Denies

## 2024-07-31 NOTE — Progress Notes (Signed)
   07/31/24 0956  Psych Admission Type (Psych Patients Only)  Admission Status Involuntary  Psychosocial Assessment  Patient Complaints Depression;Anxiety  Eye Contact Fair  Facial Expression Animated  Affect Appropriate to circumstance  Speech Logical/coherent  Interaction Assertive  Motor Activity Slow  Appearance/Hygiene In hospital gown  Behavior Characteristics Appropriate to situation  Mood Depressed  Thought Process  Coherency WDL  Content WDL  Delusions None reported or observed  Perception WDL  Hallucination None reported or observed  Judgment Poor  Confusion None  Danger to Self  Current suicidal ideation? Passive  Description of Suicide Plan Denies  Self-Injurious Behavior No self-injurious ideation or behavior indicators observed or expressed   Agreement Not to Harm Self Yes  Description of Agreement Verbal  Danger to Others  Danger to Others None reported or observed

## 2024-07-31 NOTE — BHH Group Notes (Signed)
 BHH Group Notes:  (Nursing/MHT/Case Management/Adjunct)  Date:  07/31/2024  Time:  12:49 AM  Type of Therapy:  Wrap-up group  Participation Level:  Did Not Attend  Participation Quality:    Affect:    Cognitive:    Insight:    Engagement in Group:    Modes of Intervention:    Summary of Progress/Problems: Didn't attend group.  Vanessa Barajas 07/31/2024, 12:49 AM

## 2024-07-31 NOTE — Plan of Care (Signed)
  Problem: Education: Goal: Emotional status will improve Outcome: Progressing   Problem: Education: Goal: Verbalization of understanding the information provided will improve Outcome: Progressing

## 2024-07-31 NOTE — Group Note (Unsigned)
 Date:  07/31/2024 Time:  10:50 AM  Group Topic/Focus:  Goals Group:   The focus of this group is to help patients establish daily goals to achieve during treatment and discuss how the patient can incorporate goal setting into their daily lives to aide in recovery.     Participation Level:  {BHH PARTICIPATION OZCZO:77735}  Participation Quality:  {BHH PARTICIPATION QUALITY:22265}  Affect:  {BHH AFFECT:22266}  Cognitive:  {BHH COGNITIVE:22267}  Insight: {BHH Insight2:20797}  Engagement in Group:  {BHH ENGAGEMENT IN HMNLE:77731}  Modes of Intervention:  {BHH MODES OF INTERVENTION:22269}  Additional Comments:  ***  Vanessa Barajas 07/31/2024, 10:50 AM

## 2024-07-31 NOTE — Plan of Care (Signed)
   Problem: Education: Goal: Knowledge of [facility]essa [facility]ajas [facility] Education information/materials will improve Outcome: Progressing Goal: Emotional status will improve Outcome: Progressing Goal: Mental status will improve Outcome: Progressing Goal: Verbalization of understanding the information provided will improve Outcome: Progressing   Problem: Activity: Goal: Interest or engagement in activities will improve Outcome: Progressing Goal: Sleeping patterns will improve Outcome: Progressing   Problem: Coping: Goal: Ability to verbalize frustrations and anger appropriately will improve Outcome: Progressing Goal: Ability to demonstrate self-control will improve Outcome: Progressing

## 2024-07-31 NOTE — Progress Notes (Signed)
 [facility] MD Progress Note  07/31/2024 9:52 AM Vanessa Barajas  MRN:  [id number]  Reason for admission: This is the second psychiatric admission evaluation in the [facility] health system. Patient was admitted, treated & discharged from the [facility] behavioral unit on 07-16-24 after a suicide attempt on her insulin . Patient was discharged to follow-up care at the [facility] clinic. It is quite unknown if patient was keeping her psychiatric appointment or not. Patient is currently battling some chronic medical problems that included (obesity, DM, HTN). Per chart review, patient has not been compliant with her recommended treatment regimen. Vanessa Barajas is being admitted to the [facility] with complaint of suicide attempt by overdose on her Norvasc  tablets. After medical evaluation/stabilization, she was transferred to the [facility] for further psychiatric evaluation/treatments.   Daily notes: Vanessa Barajas is seen this morning in her room. She was lying down in her bed. She presents alert, oriented & aware of situation. She is not visible on the unit this morning & did not attend this morning group sessions. She presents alert upbeat today than yesterday during her admission evaluation. She is making a good eye contact & verbally responsive. However, she reports, I'm just real anxious this morning because I'm worrying about everything. The SW has given me some resources/agencies to reach out to for help. I slept well last night. I will try to get out of bed soon. Vanessa Barajas currently denies any SIHI, AVH, delusional thoughts or paranoia. She does not appear to be responding to be responding to any internal stimuli. She denies any side effects to her medications. Patient is currently using a wheel chair to mobilize within the unit due to generalized weakness. She is also obese. She does have other chronic medical conditions. Patient overdosed on her blood pressure medicine (Norvasc ) which was her reason for admission. However, her blood pressure during her admission  evaluation was 161/78 & her pulse rate was 103. Today, her blood pressure is 114/48 & pulse rate 96. This could be because patient has not been very physically active since her admission assessment yesterday. She is not in any apparent distress. She is instructed & encouraged to get out of bed & attend group sessions to learn coping as it is part of her treatment plan here. There no diabetic distress reported by staff. There are no changes made on her current plan of care. Continue as already in progress.  Principal Problem: Major depressive disorder, recurrent severe without psychotic features (HCC)  Diagnosis: Principal Problem:   Major depressive disorder, recurrent severe without psychotic features (HCC)  Total Time spent with patient: 45 minutes  Past Psychiatric History: See H&P.  Past Medical History:  Past Medical History:  Diagnosis Date   Anxiety    Diabetes mellitus without complication (HCC)    Hemorrhoids    Hypertension     Past Surgical History:  Procedure Laterality Date   ACHILLES TENDON SURGERY     ADENOIDECTOMY     ECTOPIC PREGNANCY SURGERY     x2   HERNIA REPAIR     TONSILLECTOMY     TUBAL LIGATION     Family History:  Family History  Problem Relation Age of Onset   Leukemia Mother    Cancer Mother 58       leukemia   Hypertension Mother    Cancer Sister    Cancer Maternal Grandmother        breast   Family Psychiatric  History: See H&P.  Social History:  Social History   Substance and  Sexual Activity  Alcohol Use No     Social History   Substance and Sexual Activity  Drug Use No    Social History   Socioeconomic History   Marital status: Divorced    Spouse name: Not on file   Number of children: Not on file   Years of education: Not on file   Highest education level: Not on file  Occupational History   Not on file  Tobacco Use   Smoking status: Former    Current packs/day: 0.00    Types: Cigarettes    Quit date: 09/02/2013     Years since quitting: 10.9   Smokeless tobacco: Never  Vaping Use   Vaping status: Every Day  Substance and Sexual Activity   Alcohol use: No   Drug use: No   Sexual activity: Not Currently    Birth control/protection: Pill  Other Topics Concern   Not on file  Social History Narrative   Not on file   Social Drivers of Health   Financial Resource Strain: Low Risk  (02/02/2024)   Received from [facility]   Overall Financial Resource Strain (CARDIA)    Difficulty of Paying Living Expenses: Not hard at all  Food Insecurity: No Food Insecurity (07/30/2024)   Hunger Vital Sign    Worried About Running Out of Food in the Last Year: Never true    Ran Out of Food in the Last Year: Never true  Recent Concern: Food Insecurity - Food Insecurity Present (07/09/2024)   Hunger Vital Sign    Worried About Running Out of Food in the Last Year: Sometimes true    Ran Out of Food in the Last Year: Sometimes true  Transportation Needs: No Transportation Needs (07/30/2024)   PRAPARE - Administrator, Civil Service (Medical): No    Lack of Transportation (Non-Medical): No  Recent Concern: Transportation Needs - Unmet Transportation Needs (07/09/2024)   PRAPARE - Transportation    Lack of Transportation (Medical): Yes    Lack of Transportation (Non-Medical): Yes  Physical Activity: Inactive (02/02/2024)   Received from [facility]   Exercise Vital Sign    On average, how many days per week do you engage in moderate to strenuous exercise (like a brisk walk)?: 0 days    On average, how many minutes do you engage in exercise at this level?: 30 min  Stress: No Stress Concern Present (02/02/2024)   Received from [facility] - Occupational Stress Questionnaire    Feeling of Stress : Not at all  Social Connections: Socially Integrated (02/02/2024)   Received from [facility]   Social Network    How would you rate your social network (family,  work, friends)?: Good participation with social networks   Additional Social History:    Sleep: Good Estimated Sleeping Duration (Last 24 Hours): 8.75-10.00 hours  Appetite:  Good  Current Medications: Current Facility-Administered Medications  Medication Dose Route Frequency Provider Last Rate Last Admin   acetaminophen  (TYLENOL ) tablet 650 mg  650 mg Oral Q6H PRN Jacquetta Sharlot GRADE, NP       alum & mag hydroxide-simeth (MAALOX/MYLANTA) 200-200-20 MG/5ML suspension 30 mL  30 mL Oral Q4H PRN Jacquetta Sharlot GRADE, NP       ARIPiprazole  (ABILIFY ) tablet 15 mg  15 mg Oral Daily Jacquetta Sharlot GRADE, NP   15 mg at 07/30/24 9050   clonazePAM  (KLONOPIN ) tablet 1 mg  1 mg Oral BID Jacquetta Sharlot GRADE,  NP   1 mg at 07/30/24 1737   fluticasone  (FLONASE ) 50 MCG/ACT nasal spray 1 spray  1 spray Each Nare Daily Bouchard, Marc A, DO   1 spray at 07/30/24 1012   influenza vac split trivalent PF (FLUZONE ) injection 0.5 mL  0.5 mL Intramuscular Tomorrow-1000 Bobbitt, Shalon E, NP       insulin  aspart (novoLOG ) injection 0-15 Units  0-15 Units Subcutaneous TID WC Lord, Jamison Y, NP   3 Units at 07/31/24 9364   insulin  aspart (novoLOG ) injection 0-5 Units  0-5 Units Subcutaneous QHS Jacquetta Sharlot GRADE, NP       insulin  glargine (LANTUS ) injection 10 Units  10 Units Subcutaneous BID Lord, Jamison Y, NP   10 Units at 07/30/24 1736   magnesium  hydroxide (MILK OF MAGNESIA) suspension 30 mL  30 mL Oral Daily PRN Jacquetta Sharlot GRADE, NP       nicotine  (NICODERM CQ  - dosed in mg/24 hours) patch 14 mg  14 mg Transdermal Daily Jacquetta Sharlot GRADE, NP   14 mg at 07/30/24 9050   OLANZapine  (ZYPREXA ) injection 5 mg  5 mg Intramuscular TID PRN Jacquetta Sharlot GRADE, NP       pantoprazole  (PROTONIX ) EC tablet 40 mg  40 mg Oral Daily Jacquetta Sharlot GRADE, NP   40 mg at 07/30/24 9050   sertraline  (ZOLOFT ) tablet 200 mg  200 mg Oral Daily Lord, Jamison Y, NP   200 mg at 07/30/24 9050   Lab Results:  Results for orders placed or performed during the hospital  encounter of 07/29/24 (from the past 48 hours)  Glucose, capillary     Status: Abnormal   Collection Time: 07/30/24  5:59 AM  Result Value Ref Range   Glucose-Capillary 170 (H) 70 - 99 mg/dL    Comment: Glucose reference range applies only to samples taken after fasting for at least 8 hours.   Comment 1 Notify RN    Comment 2 Document in Chart   Glucose, capillary     Status: Abnormal   Collection Time: 07/30/24 12:11 PM  Result Value Ref Range   Glucose-Capillary 196 (H) 70 - 99 mg/dL    Comment: Glucose reference range applies only to samples taken after fasting for at least 8 hours.  Glucose, capillary     Status: Abnormal   Collection Time: 07/30/24  5:34 PM  Result Value Ref Range   Glucose-Capillary 155 (H) 70 - 99 mg/dL    Comment: Glucose reference range applies only to samples taken after fasting for at least 8 hours.  Glucose, capillary     Status: Abnormal   Collection Time: 07/30/24  8:40 PM  Result Value Ref Range   Glucose-Capillary 191 (H) 70 - 99 mg/dL    Comment: Glucose reference range applies only to samples taken after fasting for at least 8 hours.   Comment 1 Notify RN    Comment 2 Document in Chart   Glucose, capillary     Status: Abnormal   Collection Time: 07/31/24  5:46 AM  Result Value Ref Range   Glucose-Capillary 156 (H) 70 - 99 mg/dL    Comment: Glucose reference range applies only to samples taken after fasting for at least 8 hours.   Blood Alcohol level:  Lab Results  Component Value Date   [facility] <15 07/09/2024   Metabolic Disorder Labs: Lab Results  Component Value Date   HGBA1C 11.5 (H) 07/13/2024   MPG 283 07/13/2024   MPG 280.48 07/10/2024   No results  found for: PROLACTIN Lab Results  Component Value Date   CHOL 206 (H) 07/13/2024   TRIG 140 07/13/2024   HDL 40 (L) 07/13/2024   CHOLHDL 5.2 07/13/2024   VLDL 28 07/13/2024   LDLCALC 138 (H) 07/13/2024   LDLCALC UNABLE TO CALCULATE IF TRIGLYCERIDE OVER 400 mg/dL 90/78/7987    Physical Findings: AIMS:  ,  ,  ,  ,  ,  ,   CIWA:    COWS:     Musculoskeletal: Strength & Muscle Tone: within normal limits Gait & Station: normal Patient leans: N/A  Psychiatric Specialty Exam:  Presentation  General Appearance:  Disheveled  Eye Contact: Fair  Speech: Clear and Coherent; Slow  Speech Volume: Normal  Handedness: Right  Mood and Affect  Mood: Anxious; Depressed; Hopeless  Affect: Congruent; Depressed   Thought Process  Thought Processes: Coherent; Goal Directed  Descriptions of Associations:Intact  Orientation:Full (Time, Place and Person)  Thought Content:Logical  History of Schizophrenia/Schizoaffective disorder:No data recorded Duration of Psychotic Symptoms:No data recorded Hallucinations:Hallucinations: None  Ideas of Reference:None  Suicidal Thoughts:Suicidal Thoughts: No  Homicidal Thoughts:Homicidal Thoughts: No   Sensorium  Memory: Immediate Good; Recent Good; Remote Good  Judgment: Poor  Insight: Poor  Executive Functions  Concentration: Fair  Attention Span: Fair  Recall: Good  Fund of Knowledge: Fair  Language: Good   Psychomotor Activity  Psychomotor Activity: Psychomotor Activity: Decreased  Assets  Assets: Communication Skills; Desire for Improvement; Housing  Sleep  Sleep: Sleep: Fair Number of Hours of Sleep: 5.5  Physical Exam: Physical Exam HENT:     Head: Normocephalic.     Nose: Nose normal.  Cardiovascular:     Pulses: Normal pulses.     Comments: Currently using wheel chair due to lower extremity weakness. Pulmonary:     Effort: Pulmonary effort is normal.  Genitourinary:    Comments: Deferred. Musculoskeletal:        General: Normal range of motion.     Cervical back: Normal range of motion.  Skin:    General: Skin is dry.  Neurological:     General: No focal deficit present.     Mental Status: She is alert and oriented to person, place, and time.     Review of Systems  Constitutional:  Negative for chills, diaphoresis and fever.  HENT:  Negative for congestion and sore throat.   Eyes:  Negative for blurred vision.  Respiratory:  Negative for cough, shortness of breath and wheezing.   Cardiovascular:  Negative for chest pain and palpitations.  Gastrointestinal:  Negative for abdominal pain, diarrhea, heartburn, nausea and vomiting.  Genitourinary:  Negative for dysuria.  Musculoskeletal:        Currently using wheel chair due to lower extremity weakness.  Neurological:  Negative for dizziness, tingling, tremors, sensory change, speech change, focal weakness, seizures, loss of consciousness, weakness and headaches.  Endo/Heme/Allergies:        Allergies: Lisinopril, Losartan.  Psychiatric/Behavioral:  Positive for depression. Negative for hallucinations, memory loss and substance abuse. Suicidal ideas: Hx of.The patient is nervous/anxious and has insomnia.    Blood pressure (!) 114/48, pulse 96, temperature 98.5 F (36.9 C), temperature source Oral, resp. rate 20, height 5' 7 (1.702 m), weight 123.1 kg, SpO2 91%. Body mass index is 42.51 kg/m.  Treatment Plan Summary: Daily contact with patient to assess and evaluate symptoms and progress in treatment and Medication management.  Principal/active diagnoses.  Major depressive disorder, recurrent severe without psychotic features (HCC).    Medical. Diabetes  mellitus.  HTN.  Plan: The risks/benefits/side-effects/alternatives to the medications in use were discussed in detail with the patient and time was given for patient's  questions. The patient consents to medication trial.    -Continue Abilify  15 mg po daily for mood control.  -Continue Clonazepam  1 mg po bid for anxiety.  -Continue Sertraline  200 mg po daily for depression.  -Continue Nicotine  patch 14 mg trans-dermally for nicotine  withdrawal.   Agitation protocols.  -Continue as recommended.    Medical:  -Continue  Protonix  40 mg po Q am for indigestion.  -Continue The Afrin Nasal spray per each nare bid. -Continue Lantus  insulin  10 units bid for DM.  -Continue the sliding scale insulin  as recommended per blood sugar results.   Other PRNS -Continue Tylenol  650 mg every 6 hours PRN for mild pain -Continue Maalox 30 ml Q 4 hrs PRN for indigestion -Continue MOM 30 ml po Q 6 hrs for constipation.      Safety and Monitoring: Voluntary admission to inpatient psychiatric unit for safety, stabilization and treatment Daily contact with patient to assess and evaluate symptoms and progress in treatment Patient's case to be discussed in multi-disciplinary team meeting Observation Level : q15 minute checks Vital signs: q12 hours Precautions: Safety   Mac Bolster, NP, pmhnp, fnp-bc. 07/31/2024, 9:52 AM

## 2024-08-01 ENCOUNTER — Encounter (HOSPITAL_COMMUNITY): Payer: Self-pay

## 2024-08-01 LAB — GLUCOSE, CAPILLARY
Glucose-Capillary: 161 mg/dL — ABNORMAL HIGH (ref 70–99)
Glucose-Capillary: 158 mg/dL — ABNORMAL HIGH (ref 70–99)
Glucose-Capillary: 153 mg/dL — ABNORMAL HIGH (ref 70–99)
Glucose-Capillary: 160 mg/dL — ABNORMAL HIGH (ref 70–99)

## 2024-08-01 NOTE — Progress Notes (Signed)
(  Sleep Hours) - 12.25 hours (Any PRNs that were needed, meds refused, or side effects to meds)-  None (Any disturbances and when (visitation, over night)- Sinus issues (Concerns raised by the patient)- None (SI/HI/AVH)- Passive SI

## 2024-08-01 NOTE — Progress Notes (Signed)
 [facility] MD Progress Note  08/01/2024 3:45 PM Vanessa Barajas  MRN:  [id number]  Reason for admission: This is the second psychiatric admission evaluation in the [facility] health system. Patient was admitted, treated & discharged from the [facility] behavioral unit on 07-16-24 after a suicide attempt on her insulin . Patient was discharged to follow-up care at the [facility] clinic. It is quite unknown if patient was keeping her psychiatric appointment or not. Patient is currently battling some chronic medical problems that included (obesity, DM, HTN). Per chart review, patient has not been compliant with her recommended treatment regimen. Vanessa Barajas is being admitted to the [facility] with complaint of suicide attempt by overdose on her Norvasc  tablets. After medical evaluation/stabilization, she was transferred to the [facility] for further psychiatric evaluation/treatments.   Today's assessment notes:  Patient is seen and examined in her room, lying in her bed.  Vanessa Barajas is morbidly obese.  Presents alert, calm, and oriented to person, time, place, and situation.  Chart reviewed and findings shared with the treatment team and consulted attending psychiatrist with recommendation to continue with current treatment plan as already in progress.  Patient reports anxiety of #10/10 and depression as #10/10, with 10 being high severity.  Report compliance with scheduled psychotropic medications.  Encouraged patient to call for as needed for anxiety in addition to her scheduled Klonopin  1 mg p.o. twice daily for anxiety.  Patient reports her mood is still depressed due to her chronic medical conditions. Active listening and emotional support provided for ongoing stressors.  Encouraged to attend therapeutic milieu and unit group sessions to learn coping skills as it is part of her treatment plan at [facility].  Patient reports she we would go to group whenever she can as she mobilizes in her wheelchair.  She reports adequate sleep last night, nursing staff report  patient sleeping 12.25 hours and being restful.  Reports stable appetite and denies any nausea or vomiting today.  Patient does not appear to be preoccupied by internal stimuli.  She further denies SI, HI, or AVH.  Capillary blood glucose ranges from 158-161, and she continues on Lantus  10 units subcu twice daily.  Patient denies symptoms of hyperglycemia.  Will continue to monitor patient for safety.  Principal Problem: Major depressive disorder, recurrent severe without psychotic features (HCC)  Diagnosis: Principal Problem:   Major depressive disorder, recurrent severe without psychotic features (HCC)  Total Time spent with patient: 45 minutes  Past Psychiatric History: See H&P.  Past Medical History:  Past Medical History:  Diagnosis Date   Anxiety    Diabetes mellitus without complication (HCC)    Hemorrhoids    Hypertension     Past Surgical History:  Procedure Laterality Date   ACHILLES TENDON SURGERY     ADENOIDECTOMY     ECTOPIC PREGNANCY SURGERY     x2   HERNIA REPAIR     TONSILLECTOMY     TUBAL LIGATION     Family History:  Family History  Problem Relation Age of Onset   Leukemia Mother    Cancer Mother 28       leukemia   Hypertension Mother    Cancer Sister    Cancer Maternal Grandmother        breast   Family Psychiatric  History: See H&P.  Social History:  Social History   Substance and Sexual Activity  Alcohol Use No     Social History   Substance and Sexual Activity  Drug Use No    Social History  Socioeconomic History   Marital status: Divorced    Spouse name: Not on file   Number of children: Not on file   Years of education: Not on file   Highest education level: Not on file  Occupational History   Not on file  Tobacco Use   Smoking status: Former    Current packs/day: 0.00    Types: Cigarettes    Quit date: 09/02/2013    Years since quitting: 10.9   Smokeless tobacco: Never  Vaping Use   Vaping status: Every Day  Substance  and Sexual Activity   Alcohol use: No   Drug use: No   Sexual activity: Not Currently    Birth control/protection: Pill  Other Topics Concern   Not on file  Social History Narrative   Not on file   Social Drivers of Health   Financial Resource Strain: Low Risk  (02/02/2024)   Received from [facility]   Overall Financial Resource Strain (CARDIA)    Difficulty of Paying Living Expenses: Not hard at all  Food Insecurity: No Food Insecurity (07/30/2024)   Hunger Vital Sign    Worried About Running Out of Food in the Last Year: Never true    Ran Out of Food in the Last Year: Never true  Recent Concern: Food Insecurity - Food Insecurity Present (07/09/2024)   Hunger Vital Sign    Worried About Running Out of Food in the Last Year: Sometimes true    Ran Out of Food in the Last Year: Sometimes true  Transportation Needs: No Transportation Needs (07/30/2024)   PRAPARE - Administrator, Civil Service (Medical): No    Lack of Transportation (Non-Medical): No  Recent Concern: Transportation Needs - Unmet Transportation Needs (07/09/2024)   PRAPARE - Transportation    Lack of Transportation (Medical): Yes    Lack of Transportation (Non-Medical): Yes  Physical Activity: Inactive (02/02/2024)   Received from [facility]   Exercise Vital Sign    On average, how many days per week do you engage in moderate to strenuous exercise (like a brisk walk)?: 0 days    On average, how many minutes do you engage in exercise at this level?: 30 min  Stress: No Stress Concern Present (02/02/2024)   Received from [facility] of Occupational Health - Occupational Stress Questionnaire    Feeling of Stress : Not at all  Social Connections: Socially Integrated (02/02/2024)   Received from [facility]   Social Network    How would you rate your social network (family, work, friends)?: Good participation with social networks   Additional Social History:    Sleep: Good  Estimated Sleeping Duration (Last 24 Hours): 10.25-12.00 hours  Appetite:  Good  Current Medications: Current Facility-Administered Medications  Medication Dose Route Frequency Provider Last Rate Last Admin   acetaminophen  (TYLENOL ) tablet 650 mg  650 mg Oral Q6H PRN Lord, Jamison Y, NP       alum & mag hydroxide-simeth (MAALOX/MYLANTA) 200-200-20 MG/5ML suspension 30 mL  30 mL Oral Q4H PRN Jacquetta Sharlot GRADE, NP       ARIPiprazole  (ABILIFY ) tablet 15 mg  15 mg Oral Daily Jacquetta Sharlot GRADE, NP   15 mg at 08/01/24 9140   clonazePAM  (KLONOPIN ) tablet 1 mg  1 mg Oral BID Jacquetta Sharlot GRADE, NP   1 mg at 08/01/24 9140   fluticasone  (FLONASE ) 50 MCG/ACT nasal spray 1 spray  1 spray Each Nare Daily Bouchard, Marc A, DO  1 spray at 08/01/24 0858   influenza vac split trivalent PF (FLUZONE ) injection 0.5 mL  0.5 mL Intramuscular Tomorrow-1000 Bobbitt, Shalon E, NP       insulin  aspart (novoLOG ) injection 0-15 Units  0-15 Units Subcutaneous TID WC Lord, Jamison Y, NP   3 Units at 08/01/24 1232   insulin  aspart (novoLOG ) injection 0-5 Units  0-5 Units Subcutaneous QHS Jacquetta Sharlot GRADE, NP   3 Units at 07/31/24 2017   insulin  glargine (LANTUS ) injection 10 Units  10 Units Subcutaneous BID Lord, Jamison Y, NP   10 Units at 08/01/24 0900   magnesium  hydroxide (MILK OF MAGNESIA) suspension 30 mL  30 mL Oral Daily PRN Jacquetta Sharlot GRADE, NP       nicotine  (NICODERM CQ  - dosed in mg/24 hours) patch 14 mg  14 mg Transdermal Daily Jacquetta Sharlot GRADE, NP   14 mg at 08/01/24 9097   OLANZapine  (ZYPREXA ) injection 5 mg  5 mg Intramuscular TID PRN Jacquetta Sharlot GRADE, NP       pantoprazole  (PROTONIX ) EC tablet 40 mg  40 mg Oral Daily Jacquetta Sharlot GRADE, NP   40 mg at 08/01/24 9140   sertraline  (ZOLOFT ) tablet 200 mg  200 mg Oral Daily Lord, Jamison Y, NP   200 mg at 08/01/24 9141   Lab Results:  Results for orders placed or performed during the hospital encounter of 07/29/24 (from the past 48 hours)  Glucose, capillary     Status:  Abnormal   Collection Time: 07/30/24  5:34 PM  Result Value Ref Range   Glucose-Capillary 155 (H) 70 - 99 mg/dL    Comment: Glucose reference range applies only to samples taken after fasting for at least 8 hours.  Glucose, capillary     Status: Abnormal   Collection Time: 07/30/24  8:40 PM  Result Value Ref Range   Glucose-Capillary 191 (H) 70 - 99 mg/dL    Comment: Glucose reference range applies only to samples taken after fasting for at least 8 hours.   Comment 1 Notify RN    Comment 2 Document in Chart   Glucose, capillary     Status: Abnormal   Collection Time: 07/31/24  5:46 AM  Result Value Ref Range   Glucose-Capillary 156 (H) 70 - 99 mg/dL    Comment: Glucose reference range applies only to samples taken after fasting for at least 8 hours.  Glucose, capillary     Status: Abnormal   Collection Time: 07/31/24 12:19 PM  Result Value Ref Range   Glucose-Capillary 170 (H) 70 - 99 mg/dL    Comment: Glucose reference range applies only to samples taken after fasting for at least 8 hours.   Comment 1 Notify RN    Comment 2 Document in Chart   Glucose, capillary     Status: Abnormal   Collection Time: 07/31/24  5:10 PM  Result Value Ref Range   Glucose-Capillary 180 (H) 70 - 99 mg/dL    Comment: Glucose reference range applies only to samples taken after fasting for at least 8 hours.  Glucose, capillary     Status: Abnormal   Collection Time: 07/31/24  7:56 PM  Result Value Ref Range   Glucose-Capillary 287 (H) 70 - 99 mg/dL    Comment: Glucose reference range applies only to samples taken after fasting for at least 8 hours.   Comment 1 Notify RN   Glucose, capillary     Status: Abnormal   Collection Time: 08/01/24  5:54 AM  Result Value Ref Range   Glucose-Capillary 161 (H) 70 - 99 mg/dL    Comment: Glucose reference range applies only to samples taken after fasting for at least 8 hours.   Comment 1 Notify RN   Glucose, capillary     Status: Abnormal   Collection Time:  08/01/24 12:05 PM  Result Value Ref Range   Glucose-Capillary 158 (H) 70 - 99 mg/dL    Comment: Glucose reference range applies only to samples taken after fasting for at least 8 hours.   Blood Alcohol level:  Lab Results  Component Value Date   [facility] <15 07/09/2024   Metabolic Disorder Labs: Lab Results  Component Value Date   HGBA1C 11.5 (H) 07/13/2024   MPG 283 07/13/2024   MPG 280.48 07/10/2024   No results found for: PROLACTIN Lab Results  Component Value Date   CHOL 206 (H) 07/13/2024   TRIG 140 07/13/2024   HDL 40 (L) 07/13/2024   CHOLHDL 5.2 07/13/2024   VLDL 28 07/13/2024   LDLCALC 138 (H) 07/13/2024   LDLCALC UNABLE TO CALCULATE IF TRIGLYCERIDE OVER 400 mg/dL 90/78/7987   Physical Findings: AIMS:  ,  ,  ,  ,  ,  ,   CIWA:    COWS:     Musculoskeletal: Strength & Muscle Tone: within normal limits Gait & Station: normal Patient leans: N/A  Psychiatric Specialty Exam:  Presentation  General Appearance:  Casual  Eye Contact: Fair  Speech: Clear and Coherent  Speech Volume: Normal  Handedness: Right  Mood and Affect  Mood: Angry; Anxious; Hopeless  Affect: Congruent  Thought Process  Thought Processes: Coherent; Linear  Descriptions of Associations:Intact  Orientation:Full (Time, Place and Person)  Thought Content: Logical  History of Schizophrenia/Schizoaffective disorder:No data recorded Duration of Psychotic Symptoms:No data recorded Hallucinations:Hallucinations: None  Ideas of Reference:None  Suicidal Thoughts:Suicidal Thoughts: No  Homicidal Thoughts:Homicidal Thoughts: No  Sensorium  Memory: Immediate Fair; Recent Fair  Judgment: Fair  Insight: Fair  Art therapist  Concentration: Fair  Attention Span: Fair  Recall: Good  Fund of Knowledge: Fair  Language: Good  Psychomotor Activity  Psychomotor Activity: Psychomotor Activity: Decreased  Assets  Assets: Communication Skills; Physical  Health; Resilience  Sleep  Sleep: Sleep: Good Number of Hours of Sleep: 12.25  Physical Exam: Physical Exam HENT:     Head: Normocephalic.     Nose: Nose normal.     Mouth/Throat:     Mouth: Mucous membranes are moist.     Pharynx: Oropharynx is clear.  Eyes:     Extraocular Movements: Extraocular movements intact.  Cardiovascular:     Rate and Rhythm: Normal rate.     Pulses: Normal pulses.     Comments: Currently using wheel chair due to lower extremity weakness. Pulmonary:     Effort: Pulmonary effort is normal.  Abdominal:     Comments: Deferred  Genitourinary:    Comments: Deferred. Musculoskeletal:        General: Normal range of motion.     Cervical back: Normal range of motion.  Skin:    General: Skin is dry.  Neurological:     General: No focal deficit present.     Mental Status: She is alert and oriented to person, place, and time.  Psychiatric:        Mood and Affect: Mood normal.        Behavior: Behavior normal.    Review of Systems  Constitutional:  Negative for chills, diaphoresis and fever.  HENT:  Negative for congestion and sore throat.   Eyes:  Negative for blurred vision.  Respiratory:  Negative for cough, shortness of breath and wheezing.   Cardiovascular:  Negative for chest pain and palpitations.  Gastrointestinal:  Negative for abdominal pain, diarrhea, heartburn, nausea and vomiting.  Genitourinary:  Negative for dysuria.  Musculoskeletal:        Currently using wheel chair due to lower extremity weakness.  Neurological:  Negative for dizziness, tingling, tremors, sensory change, speech change, focal weakness, seizures, loss of consciousness, weakness and headaches.  Endo/Heme/Allergies:        Allergies: Lisinopril, Losartan.  Psychiatric/Behavioral:  Positive for depression. Negative for hallucinations, memory loss and substance abuse. Suicidal ideas: Hx of.The patient is nervous/anxious and has insomnia.    Blood pressure 105/70,  pulse 95, temperature 98.1 F (36.7 C), temperature source Oral, resp. rate 20, height 5' 7 (1.702 m), weight 123.1 kg, SpO2 93%. Body mass index is 42.51 kg/m.  Treatment Plan Summary: Daily contact with patient to assess and evaluate symptoms and progress in treatment and Medication management.  Principal/active diagnoses.  Major depressive disorder, recurrent severe without psychotic features (HCC).    Medical. Diabetes mellitus.  HTN.  Plan: The risks/benefits/side-effects/alternatives to the medications in use were discussed in detail with the patient and time was given for patient's  questions. The patient consents to medication trial.    -Continue Abilify  15 mg po daily for mood control.  -Continue Clonazepam  1 mg po bid for anxiety.  -Continue Sertraline  200 mg po daily for depression.  -Continue Nicotine  patch 14 mg trans-dermally for nicotine  withdrawal.   Agitation protocols.  -Continue as recommended.    Medical:  -Continue Protonix  40 mg po Q am for indigestion.  -Continue The Afrin Nasal spray per each nare bid. -Continue Lantus  insulin  10 units bid for DM.  -Continue the sliding scale insulin  as recommended per blood sugar results.   Other PRNS -Continue Tylenol  650 mg every 6 hours PRN for mild pain -Continue Maalox 30 ml Q 4 hrs PRN for indigestion -Continue MOM 30 ml po Q 6 hrs for constipation.    Safety and Monitoring: Voluntary admission to inpatient psychiatric unit for safety, stabilization and treatment Daily contact with patient to assess and evaluate symptoms and progress in treatment Patient's case to be discussed in multi-disciplinary team meeting Observation Level : q15 minute checks Vital signs: q12 hours Precautions: Safety   Ellouise JAYSON Azure, FNP. 08/01/2024, 3:45 PM Patient ID: [name], female   DOB: Dec 09, 1970, 53 y.o.   MRN: [id number]

## 2024-08-01 NOTE — Group Note (Signed)
 Date:  08/01/2024 Time:  9:21 PM  Group Topic/Focus:  Wrap-Up Group:   The focus of this group is to help patients review their daily goal of treatment and discuss progress on daily workbooks. Alcoholics Anonymous (AA) Meeting    Participation Level:  Did Not Attend  Participation Quality:  N/A  Affect:  N/A  Cognitive:  N/A  Insight: None  Engagement in Group:  N/A  Modes of Intervention:  N/A  Additional Comments:  Patient did not attend  Eward Mace 08/01/2024, 9:21 PM

## 2024-08-01 NOTE — BH IP Treatment Plan (Signed)
 Interdisciplinary Treatment and Diagnostic Plan Update  08/01/2024 Time of Session: 11:30AM Vanessa Barajas MRN: [id number]  Principal Diagnosis: Major depressive disorder, recurrent severe without psychotic features (HCC)  Secondary Diagnoses: Principal Problem:   Major depressive disorder, recurrent severe without psychotic features (HCC)   Current Medications:  Current Facility-Administered Medications  Medication Dose Route Frequency Provider Last Rate Last Admin   acetaminophen  (TYLENOL ) tablet 650 mg  650 mg Oral Q6H PRN Lord, Jamison Y, NP       alum & mag hydroxide-simeth (MAALOX/MYLANTA) 200-200-20 MG/5ML suspension 30 mL  30 mL Oral Q4H PRN Jacquetta Sharlot GRADE, NP       ARIPiprazole  (ABILIFY ) tablet 15 mg  15 mg Oral Daily Lord, Jamison Y, NP   15 mg at 08/01/24 0859   clonazePAM  (KLONOPIN ) tablet 1 mg  1 mg Oral BID Jacquetta Sharlot GRADE, NP   1 mg at 08/01/24 0859   fluticasone  (FLONASE ) 50 MCG/ACT nasal spray 1 spray  1 spray Each Nare Daily Prentis Kitchens A, DO   1 spray at 08/01/24 0858   influenza vac split trivalent PF (FLUZONE ) injection 0.5 mL  0.5 mL Intramuscular Tomorrow-1000 Bobbitt, Shalon E, NP       insulin  aspart (novoLOG ) injection 0-15 Units  0-15 Units Subcutaneous TID WC Lord, Jamison Y, NP   3 Units at 08/01/24 1232   insulin  aspart (novoLOG ) injection 0-5 Units  0-5 Units Subcutaneous QHS Jacquetta Sharlot GRADE, NP   3 Units at 07/31/24 2017   insulin  glargine (LANTUS ) injection 10 Units  10 Units Subcutaneous BID Lord, Jamison Y, NP   10 Units at 08/01/24 0900   magnesium  hydroxide (MILK OF MAGNESIA) suspension 30 mL  30 mL Oral Daily PRN Jacquetta Sharlot GRADE, NP       nicotine  (NICODERM CQ  - dosed in mg/24 hours) patch 14 mg  14 mg Transdermal Daily Jacquetta Sharlot GRADE, NP   14 mg at 08/01/24 9097   OLANZapine  (ZYPREXA ) injection 5 mg  5 mg Intramuscular TID PRN Jacquetta Sharlot GRADE, NP       pantoprazole  (PROTONIX ) EC tablet 40 mg  40 mg Oral Daily Jacquetta Sharlot GRADE, NP   40 mg at 08/01/24  9140   sertraline  (ZOLOFT ) tablet 200 mg  200 mg Oral Daily Lord, Jamison Y, NP   200 mg at 08/01/24 9141   PTA Medications: Medications Prior to Admission  Medication Sig Dispense Refill Last Dose/Taking   ARIPiprazole  (ABILIFY ) 15 MG tablet Take 1 tablet (15 mg total) by mouth daily. 30 tablet 0    clonazePAM  (KLONOPIN ) 1 MG tablet Take 1 tablet (1 mg total) by mouth 3 (three) times daily for 7 days. 21 tablet 0    fluticasone  (FLONASE ) 50 MCG/ACT nasal spray Place 2 sprays into both nostrils daily as needed for allergies or rhinitis.      glipiZIDE  (GLUCOTROL  XL) 5 MG 24 hr tablet Take 1 tablet (5 mg total) by mouth daily with breakfast. 30 tablet 0    insulin  glargine (LANTUS ) 100 UNIT/ML injection Inject 0.2 mLs (20 Units total) into the skin daily. 10 mL 0    nicotine  (NICODERM CQ  - DOSED IN MG/24 HR) 7 mg/24hr patch Place 1 patch (7 mg total) onto the skin daily. 30 patch 0    NOVOLIN  R FLEXPEN RELION 100 UNIT/ML FlexPen Inject 15 Units into the skin 3 (three) times daily. 15 mL 0    pantoprazole  (PROTONIX ) 40 MG tablet Take 40 mg by mouth daily.  sertraline  (ZOLOFT ) 100 MG tablet Take 2 tablets (200 mg total) by mouth daily. 60 tablet 0     Patient Stressors: Financial difficulties   Health problems   Occupational concerns    Patient Strengths: Average or above average intelligence  Communication skills  Work skills   Treatment Modalities: Medication Management, Group therapy, Case management,  1 to 1 session with clinician, Psychoeducation, Recreational therapy.   Physician Treatment Plan for Primary Diagnosis: Major depressive disorder, recurrent severe without psychotic features (HCC) Long Term Goal(s): Improvement in symptoms so as ready for discharge   Short Term Goals: Ability to identify and develop effective coping behaviors will improve Ability to maintain clinical measurements within normal limits will improve Compliance with prescribed medications will  improve Ability to identify triggers associated with substance abuse/mental health issues will improve Ability to identify changes in lifestyle to reduce recurrence of condition will improve Ability to verbalize feelings will improve Ability to disclose and discuss suicidal ideas Ability to demonstrate self-control will improve  Medication Management: Evaluate patient's response, side effects, and tolerance of medication regimen.  Therapeutic Interventions: 1 to 1 sessions, Unit Group sessions and Medication administration.  Evaluation of Outcomes: Not Progressing  Physician Treatment Plan for Secondary Diagnosis: Principal Problem:   Major depressive disorder, recurrent severe without psychotic features (HCC)  Long Term Goal(s): Improvement in symptoms so as ready for discharge   Short Term Goals: Ability to identify and develop effective coping behaviors will improve Ability to maintain clinical measurements within normal limits will improve Compliance with prescribed medications will improve Ability to identify triggers associated with substance abuse/mental health issues will improve Ability to identify changes in lifestyle to reduce recurrence of condition will improve Ability to verbalize feelings will improve Ability to disclose and discuss suicidal ideas Ability to demonstrate self-control will improve     Medication Management: Evaluate patient's response, side effects, and tolerance of medication regimen.  Therapeutic Interventions: 1 to 1 sessions, Unit Group sessions and Medication administration.  Evaluation of Outcomes: Not Progressing   RN Treatment Plan for Primary Diagnosis: Major depressive disorder, recurrent severe without psychotic features (HCC) Long Term Goal(s): Knowledge of disease and therapeutic regimen to maintain health will improve  Short Term Goals: Ability to remain free from injury will improve, Ability to verbalize frustration and anger  appropriately will improve, Ability to demonstrate self-control, Ability to participate in decision making will improve, Ability to verbalize feelings will improve, and Compliance with prescribed medications will improve  Medication Management: RN will administer medications as ordered by provider, will assess and evaluate patient's response and provide education to patient for prescribed medication. RN will report any adverse and/or side effects to prescribing provider.  Therapeutic Interventions: 1 on 1 counseling sessions, Psychoeducation, Medication administration, Evaluate responses to treatment, Monitor vital signs and CBGs as ordered, Perform/monitor CIWA, COWS, AIMS and Fall Risk screenings as ordered, Perform wound care treatments as ordered.  Evaluation of Outcomes: Not Progressing   LCSW Treatment Plan for Primary Diagnosis: Major depressive disorder, recurrent severe without psychotic features (HCC) Long Term Goal(s): Safe transition to appropriate next level of care at discharge, Engage patient in therapeutic group addressing interpersonal concerns.  Short Term Goals: Engage patient in aftercare planning with referrals and resources, Increase social support, Increase ability to appropriately verbalize feelings, Increase emotional regulation, Facilitate acceptance of mental health diagnosis and concerns, and Increase skills for wellness and recovery  Therapeutic Interventions: Assess for all discharge needs, 1 to 1 time with Social worker, Explore available  resources and support systems, Assess for adequacy in community support network, Educate family and significant other(s) on suicide prevention, Complete Psychosocial Assessment, Interpersonal group therapy.  Evaluation of Outcomes: Not Progressing   Progress in Treatment: Attending groups: No. Participating in groups: No. Taking medication as prescribed: Yes. Toleration medication: Yes. Family/Significant other contact made:  No, will contact:  patient did not give consent to contact family/friends. Patient understands diagnosis: Yes. Discussing patient identified problems/goals with staff: Yes. Medical problems stabilized or resolved: Yes. Denies suicidal/homicidal ideation: Yes. Issues/concerns per patient self-inventory: No. Patient Goals:  I don't really have one. Lessen my mental health symptoms.  Discharge Plan or Barriers: Patient likely to discharge home once stable.   Reason for Continuation of Hospitalization: Depression Medication stabilization  Estimated Length of Stay: 5-7 days  Last 3 [place] Suicide Severity Risk Score: Flowsheet Row [facility] (Current[facility] (Discharged) from 07/27/2024 in [facility] [place] [facility] Admission (Discharged) from 07/10/2024 in [facility]  C-SSRS RISK CATEGORY High Risk High Risk High Risk    Last PHQ 2/9 Scores:     No data to display          Scribe for Treatment Team: Yamari Ventola M Armanda Forand, [place]RA[place] 08/01/2024 1:07 PM

## 2024-08-01 NOTE — Progress Notes (Signed)
   08/01/24 1300  Psych Admission Type (Psych Patients Only)  Admission Status Involuntary  Psychosocial Assessment  Patient Complaints Anxiety;Depression  Eye Contact Fair  Facial Expression Animated  Affect Appropriate to circumstance  Speech Logical/coherent  Interaction Assertive  Motor Activity Slow  Appearance/Hygiene Unremarkable  Behavior Characteristics Appropriate to situation  Mood Depressed  Thought Process  Coherency WDL  Content WDL  Delusions None reported or observed  Perception WDL  Hallucination None reported or observed  Judgment Poor  Confusion None  Danger to Self  Current suicidal ideation? Passive  Description of Suicide Plan No Plan  Self-Injurious Behavior No self-injurious ideation or behavior indicators observed or expressed   Agreement Not to Harm Self Yes  Description of Agreement Verbal Contract  Danger to Others  Danger to Others None reported or observed

## 2024-08-01 NOTE — Plan of Care (Signed)
  Problem: Activity: Goal: Sleeping patterns will improve Outcome: Progressing   Problem: Activity: Goal: Interest or engagement in activities will improve Outcome: Not Progressing   

## 2024-08-01 NOTE — Group Note (Signed)
 Recreation Therapy Group Note   Group Topic:Health and Wellness  Group Date: 08/01/2024 Start Time: 0932 End Time: 0958 Facilitators: Harlea Goetzinger-McCall, LRT,CTRS Location: [address] Dayroom   Group Topic: Wellness  Goal Area(s) Addresses:  Patient will define components of whole wellness. Patient will verbalize benefit of whole wellness.  Behavioral Response:   Intervention: Music  Activity: Exercise. LRT and patients discussed the importance of physical fitness. Patients then took turns leading the group in the exercises of their choosing. Patients completed two rounds of exercise. Patients could take breaks and get water if needed.   Education: Wellness, Building control surveyor.   Education Outcome: Acknowledges education/In group clarification offered/Needs additional education.    Affect/Mood: N/A   Participation Level: Did not attend    Clinical Observations/Individualized Feedback:      Plan: Continue to engage patient in RT group sessions 2-3x/week.   Vanessa Barajas, LRT,CTRS 08/01/2024 1:08 PM

## 2024-08-01 NOTE — Plan of Care (Signed)
  Problem: Education: Goal: Mental status will improve Outcome: Not Progressing   Problem: Activity: Goal: Interest or engagement in activities will improve Outcome: Not Progressing

## 2024-08-02 LAB — GLUCOSE, CAPILLARY
Glucose-Capillary: 136 mg/dL — ABNORMAL HIGH (ref 70–99)
Glucose-Capillary: 184 mg/dL — ABNORMAL HIGH (ref 70–99)
Glucose-Capillary: 178 mg/dL — ABNORMAL HIGH (ref 70–99)
Glucose-Capillary: 226 mg/dL — ABNORMAL HIGH (ref 70–99)

## 2024-08-02 NOTE — Group Note (Signed)
 Date:  08/02/2024 Time:  4:32 PM  Group Topic/Focus:  Using Ask Me 3 to Understand Treatment    Participation Level:  Did Not Attend   Vanessa Barajas 08/02/2024, 4:32 PM

## 2024-08-02 NOTE — Plan of Care (Signed)
  Problem: Education: Goal: Knowledge of [place] [facility] Education information/materials will improve Outcome: Progressing Goal: Mental status will improve Outcome: Progressing   

## 2024-08-02 NOTE — Group Note (Signed)
 Recreation Therapy Group Note   Group Topic:Animal Assisted Therapy   Group Date: 08/02/2024 Start Time: 9050 End Time: 1030 Facilitators: Avante Carneiro-McCall, LRT,CTRS Location: [address] Dayroom   Animal-Assisted Activity (AAA) Program Checklist/Progress Notes Patient Eligibility Criteria Checklist & Daily Group note for Rec Tx Intervention  AAA/T Program Assumption of Risk Form signed by Patient/ or Parent Legal Guardian Yes  Patient understands his/her participation is voluntary Yes  Behavioral Response:    Education: Charity fundraiser, Appropriate Animal Interaction   Education Outcome: Acknowledges education.    Affect/Mood: N/A   Participation Level: Did not attend    Clinical Observations/Individualized Feedback:      Plan: Continue to engage patient in RT group sessions 2-3x/week.   Afia Messenger-McCall, LRT,CTRS  08/02/2024 12:17 PM

## 2024-08-02 NOTE — [facility] Group Notes (Signed)
 Psychoeducational Group Note  Date:  08/02/2024 Time:  2000  Group Topic/Focus:  Wrap up group  Participation Level: Did Not Attend  Participation Quality:  Not Applicable  Affect:  Not Applicable  Cognitive:  Not Applicable  Insight:  Not Applicable  Engagement in Group: Not Applicable  Additional Comments:  Did not attend.   Lenora Manuelita RAMAN 08/02/2024, 8:57 PM

## 2024-08-02 NOTE — [facility] Group Notes (Signed)
 Psychoeducational Group Note  Date:  08/02/2024 Time:  8:30  Group Topic/Focus:  Goals Group:   The focus of this group is to help patients establish daily goals to achieve during treatment and discuss how the patient can incorporate goal setting into their daily lives to aide in recovery.  Participation Level: Did Not Attend  Participation Quality:  Not Applicable  Affect:  Not Applicable  Cognitive:  Not Applicable  Insight:  Not Applicable  Engagement in Group: Not Applicable  Additional Comments:  Pt didn't come to goal group.  Delorus Langwell, [place] 08/02/2024, 9:55 AM

## 2024-08-02 NOTE — Progress Notes (Signed)
 [facility] MD Progress Note  08/02/2024 3:51 PM Trixie Maclaren  MRN:  [id number]  Reason for admission: This is the second psychiatric admission evaluation in the [facility] health system. Patient was admitted, treated & discharged from the [facility] behavioral unit on 07-16-24 after a suicide attempt on her insulin . Patient was discharged to follow-up care at the [facility] clinic. It is quite unknown if patient was keeping her psychiatric appointment or not. Patient is currently battling some chronic medical problems that included (obesity, DM, HTN). Per chart review, patient has not been compliant with her recommended treatment regimen. Jenae is being admitted to the [facility] with complaint of suicide attempt by overdose on her Norvasc  tablets. After medical evaluation/stabilization, she was transferred to the [facility] for further psychiatric evaluation/treatments.   Today's assessment notes:  Myla presents alert, calm, and oriented to person, time, place, and situation.  Chart reviewed and findings shared with the treatment team and consults with attending psychiatrist with recommendation to continue with current treatment plan as already in progress.  Observed ambulating from her bed to the bathroom with unsteady gait. Orders placed for OT and cognitive services. Edward Hollan, OT scheduled to work with patient tomorrow.  Patient reports anxiety of #10/10 and depression as #10/10, with 10 being high severity.  Report compliance with scheduled psychotropic medications.  Encouraged patient to call for as needed for anxiety in addition to her scheduled Klonopin  1 mg p.o. twice daily for anxiety.  Patient reports GI discomfort and having a loose bowel x 1, and not able to attend therapeutic milieu.  Encouragement provided to request PRNs for GI discomfort from nursing staff.  Further encouraged to attend therapeutic milieu and unit group sessions to learn coping skills as it is part of her treatment plan at [facility]. She reports adequate sleep  last night, nursing staff report patient sleeping 9.5 hours and being restful.  Reports stable appetite and denies any nausea or vomiting today.  Patient does not appear to be preoccupied by internal stimuli.  She further denies SI, HI, or AVH.  Capillary blood glucose ranges from 136-184, and she continues on Lantus  10 units subcu twice daily.  Patient denies symptoms of hyperglycemia.  Will continue to monitor patient for safety.  Principal Problem: Major depressive disorder, recurrent severe without psychotic features (HCC)  Diagnosis: Principal Problem:   Major depressive disorder, recurrent severe without psychotic features (HCC)  Total Time spent with patient: 45 minutes  Past Psychiatric History: See H&P.  Past Medical History:  Past Medical History:  Diagnosis Date   Anxiety    Diabetes mellitus without complication (HCC)    Hemorrhoids    Hypertension     Past Surgical History:  Procedure Laterality Date   ACHILLES TENDON SURGERY     ADENOIDECTOMY     ECTOPIC PREGNANCY SURGERY     x2   HERNIA REPAIR     TONSILLECTOMY     TUBAL LIGATION     Family History:  Family History  Problem Relation Age of Onset   Leukemia Mother    Cancer Mother 87       leukemia   Hypertension Mother    Cancer Sister    Cancer Maternal Grandmother        breast   Family Psychiatric  History: See H&P.  Social History:  Social History   Substance and Sexual Activity  Alcohol Use No     Social History   Substance and Sexual Activity  Drug Use No    Social  History   Socioeconomic History   Marital status: Divorced    Spouse name: Not on file   Number of children: Not on file   Years of education: Not on file   Highest education level: Not on file  Occupational History   Not on file  Tobacco Use   Smoking status: Former    Current packs/day: 0.00    Types: Cigarettes    Quit date: 09/02/2013    Years since quitting: 10.9   Smokeless tobacco: Never  Vaping Use   Vaping  status: Every Day  Substance and Sexual Activity   Alcohol use: No   Drug use: No   Sexual activity: Not Currently    Birth control/protection: Pill  Other Topics Concern   Not on file  Social History Narrative   Not on file   Social Drivers of Health   Financial Resource Strain: Low Risk  (02/02/2024)   Received from [facility]   Overall Financial Resource Strain (CARDIA)    Difficulty of Paying Living Expenses: Not hard at all  Food Insecurity: No Food Insecurity (07/30/2024)   Hunger Vital Sign    Worried About Running Out of Food in the Last Year: Never true    Ran Out of Food in the Last Year: Never true  Recent Concern: Food Insecurity - Food Insecurity Present (07/09/2024)   Hunger Vital Sign    Worried About Running Out of Food in the Last Year: Sometimes true    Ran Out of Food in the Last Year: Sometimes true  Transportation Needs: No Transportation Needs (07/30/2024)   PRAPARE - Administrator, Civil Service (Medical): No    Lack of Transportation (Non-Medical): No  Recent Concern: Transportation Needs - Unmet Transportation Needs (07/09/2024)   PRAPARE - Transportation    Lack of Transportation (Medical): Yes    Lack of Transportation (Non-Medical): Yes  Physical Activity: Inactive (02/02/2024)   Received from [facility]   Exercise Vital Sign    On average, how many days per week do you engage in moderate to strenuous exercise (like a brisk walk)?: 0 days    On average, how many minutes do you engage in exercise at this level?: 30 min  Stress: No Stress Concern Present (02/02/2024)   Received from [facility] - Occupational Stress Questionnaire    Feeling of Stress : Not at all  Social Connections: Socially Integrated (02/02/2024)   Received from [facility]   Social Network    How would you rate your social network (family, work, friends)?: Good participation with social networks   Additional Social  History:    Sleep: Good Estimated Sleeping Duration (Last 24 Hours): 7.75-8.50 hours  Appetite:  Good  Current Medications: Current Facility-Administered Medications  Medication Dose Route Frequency Provider Last Rate Last Admin   acetaminophen  (TYLENOL ) tablet 650 mg  650 mg Oral Q6H PRN Lord, Jamison Y, NP       alum & mag hydroxide-simeth (MAALOX/MYLANTA) 200-200-20 MG/5ML suspension 30 mL  30 mL Oral Q4H PRN Jacquetta Sharlot GRADE, NP       ARIPiprazole  (ABILIFY ) tablet 15 mg  15 mg Oral Daily Jacquetta Sharlot GRADE, NP   15 mg at 08/02/24 9076   clonazePAM  (KLONOPIN ) tablet 1 mg  1 mg Oral BID Jacquetta Sharlot GRADE, NP   1 mg at 08/02/24 9077   fluticasone  (FLONASE ) 50 MCG/ACT nasal spray 1 spray  1 spray Each Nare Daily Prentis Kitchens  A, DO   1 spray at 08/02/24 0922   influenza vac split trivalent PF (FLUZONE ) injection 0.5 mL  0.5 mL Intramuscular Tomorrow-1000 Bobbitt, Shalon E, NP       insulin  aspart (novoLOG ) injection 0-15 Units  0-15 Units Subcutaneous TID WC Lord, Jamison Y, NP   3 Units at 08/02/24 1249   insulin  aspart (novoLOG ) injection 0-5 Units  0-5 Units Subcutaneous QHS Jacquetta Sharlot GRADE, NP   3 Units at 07/31/24 2017   insulin  glargine (LANTUS ) injection 10 Units  10 Units Subcutaneous BID Lord, Jamison Y, NP   10 Units at 08/02/24 9076   magnesium  hydroxide (MILK OF MAGNESIA) suspension 30 mL  30 mL Oral Daily PRN Lord, Jamison Y, NP       nicotine  (NICODERM CQ  - dosed in mg/24 hours) patch 14 mg  14 mg Transdermal Daily Jacquetta Sharlot GRADE, NP   14 mg at 08/02/24 9076   OLANZapine  (ZYPREXA ) injection 5 mg  5 mg Intramuscular TID PRN Jacquetta Sharlot GRADE, NP       pantoprazole  (PROTONIX ) EC tablet 40 mg  40 mg Oral Daily Jacquetta Sharlot GRADE, NP   40 mg at 08/02/24 9077   sertraline  (ZOLOFT ) tablet 200 mg  200 mg Oral Daily Lord, Jamison Y, NP   200 mg at 08/02/24 9077   Lab Results:  Results for orders placed or performed during the hospital encounter of 07/29/24 (from the past 48 hours)  Glucose,  capillary     Status: Abnormal   Collection Time: 07/31/24  5:10 PM  Result Value Ref Range   Glucose-Capillary 180 (H) 70 - 99 mg/dL    Comment: Glucose reference range applies only to samples taken after fasting for at least 8 hours.  Glucose, capillary     Status: Abnormal   Collection Time: 07/31/24  7:56 PM  Result Value Ref Range   Glucose-Capillary 287 (H) 70 - 99 mg/dL    Comment: Glucose reference range applies only to samples taken after fasting for at least 8 hours.   Comment 1 Notify RN   Glucose, capillary     Status: Abnormal   Collection Time: 08/01/24  5:54 AM  Result Value Ref Range   Glucose-Capillary 161 (H) 70 - 99 mg/dL    Comment: Glucose reference range applies only to samples taken after fasting for at least 8 hours.   Comment 1 Notify RN   Glucose, capillary     Status: Abnormal   Collection Time: 08/01/24 12:05 PM  Result Value Ref Range   Glucose-Capillary 158 (H) 70 - 99 mg/dL    Comment: Glucose reference range applies only to samples taken after fasting for at least 8 hours.  Glucose, capillary     Status: Abnormal   Collection Time: 08/01/24  5:13 PM  Result Value Ref Range   Glucose-Capillary 153 (H) 70 - 99 mg/dL    Comment: Glucose reference range applies only to samples taken after fasting for at least 8 hours.  Glucose, capillary     Status: Abnormal   Collection Time: 08/01/24  8:46 PM  Result Value Ref Range   Glucose-Capillary 160 (H) 70 - 99 mg/dL    Comment: Glucose reference range applies only to samples taken after fasting for at least 8 hours.  Glucose, capillary     Status: Abnormal   Collection Time: 08/02/24  6:22 AM  Result Value Ref Range   Glucose-Capillary 136 (H) 70 - 99 mg/dL    Comment: Glucose reference  range applies only to samples taken after fasting for at least 8 hours.  Glucose, capillary     Status: Abnormal   Collection Time: 08/02/24 11:31 AM  Result Value Ref Range   Glucose-Capillary 184 (H) 70 - 99 mg/dL     Comment: Glucose reference range applies only to samples taken after fasting for at least 8 hours.   Blood Alcohol level:  Lab Results  Component Value Date   [facility] <15 07/09/2024   Metabolic Disorder Labs: Lab Results  Component Value Date   HGBA1C 11.5 (H) 07/13/2024   MPG 283 07/13/2024   MPG 280.48 07/10/2024   No results found for: PROLACTIN Lab Results  Component Value Date   CHOL 206 (H) 07/13/2024   TRIG 140 07/13/2024   HDL 40 (L) 07/13/2024   CHOLHDL 5.2 07/13/2024   VLDL 28 07/13/2024   LDLCALC 138 (H) 07/13/2024   LDLCALC UNABLE TO CALCULATE IF TRIGLYCERIDE OVER 400 mg/dL 90/78/7987   Physical Findings: AIMS:  ,  ,  ,  ,  ,  ,   CIWA:    COWS:     Musculoskeletal: Strength & Muscle Tone: within normal limits Gait & Station: normal Patient leans: N/A  Psychiatric Specialty Exam:  Presentation  General Appearance:  Casual  Eye Contact: Fair  Speech: Clear and Coherent  Speech Volume: Normal  Handedness: Right  Mood and Affect  Mood: Depressed; Anxious  Affect: Congruent  Thought Process  Thought Processes: Coherent  Descriptions of Associations:Intact  Orientation:Full (Time, Place and Person)  Thought Content: Logical  History of Schizophrenia/Schizoaffective disorder:No data recorded Duration of Psychotic Symptoms:No data recorded Hallucinations:Hallucinations: None  Ideas of Reference:None  Suicidal Thoughts:Suicidal Thoughts: Yes, Passive SI Passive Intent and/or Plan: Without Intent; Without Plan; Without Means to Carry Out; Without Access to Means  Homicidal Thoughts:Homicidal Thoughts: No  Sensorium  Memory: Immediate Fair; Recent Fair  Judgment: Fair  Insight: Fair  Art therapist  Concentration: Fair  Attention Span: Fair  Recall: [employer] of Knowledge: Fair  Language: Good  Psychomotor Activity  Psychomotor Activity: Psychomotor Activity: Decreased (Consult ordered for OT/cognitive  services.)  Assets  Assets: Communication Skills; Physical Health; Resilience  Sleep  Sleep: Sleep: Good Number of Hours of Sleep: 9.5  Physical Exam: Physical Exam Vitals and nursing note reviewed.  Constitutional:      Appearance: She is obese.  HENT:     Head: Normocephalic.     Right Ear: External ear normal.     Left Ear: External ear normal.     Nose: Nose normal.     Mouth/Throat:     Mouth: Mucous membranes are moist.     Pharynx: Oropharynx is clear.  Eyes:     Extraocular Movements: Extraocular movements intact.  Cardiovascular:     Rate and Rhythm: Normal rate.     Pulses: Normal pulses.     Comments: Currently using wheel chair due to lower extremity weakness. Pulmonary:     Effort: Pulmonary effort is normal.  Abdominal:     Comments: Deferred  Genitourinary:    Comments: Deferred. Musculoskeletal:        General: Normal range of motion.     Cervical back: Normal range of motion.  Skin:    General: Skin is dry.  Neurological:     General: No focal deficit present.     Mental Status: She is alert and oriented to person, place, and time.  Psychiatric:        Mood and  Affect: Mood normal.        Behavior: Behavior normal.    Review of Systems  Constitutional:  Negative for chills, diaphoresis and fever.  HENT:  Negative for congestion and sore throat.   Eyes:  Negative for blurred vision.  Respiratory:  Negative for cough, shortness of breath and wheezing.   Cardiovascular:  Negative for chest pain and palpitations.  Gastrointestinal:  Negative for abdominal pain, diarrhea, heartburn, nausea and vomiting.  Genitourinary:  Negative for dysuria.  Musculoskeletal:        Currently using wheel chair due to lower extremity weakness.  Neurological:  Negative for dizziness, tingling, tremors, sensory change, speech change, focal weakness, seizures, loss of consciousness, weakness and headaches.  Endo/Heme/Allergies:        Allergies: Lisinopril,  Losartan.  Psychiatric/Behavioral:  Positive for depression. Negative for hallucinations, memory loss and substance abuse. Suicidal ideas: Hx of.The patient is nervous/anxious and has insomnia.    Blood pressure 121/66, pulse 88, temperature 98.1 F (36.7 C), temperature source Oral, resp. rate 20, height 5' 7 (1.702 m), weight 123.1 kg, SpO2 91%. Body mass index is 42.51 kg/m.  Treatment Plan Summary: Daily contact with patient to assess and evaluate symptoms and progress in treatment and Medication management.  Principal/active diagnoses.  Major depressive disorder, recurrent severe without psychotic features (HCC).    Medical. Diabetes mellitus.  HTN.  Plan: The risks/benefits/side-effects/alternatives to the medications in use were discussed in detail with the patient and time was given for patient's  questions. The patient consents to medication trial.    -Continue Abilify  15 mg po daily for mood control.  -Continue Clonazepam  1 mg po bid for anxiety.  -Continue Sertraline  200 mg po daily for depression.  -Continue Nicotine  patch 14 mg trans-dermally for nicotine  withdrawal.   Agitation protocols.  -Continue as recommended.    Medical:  -Continue Protonix  40 mg po Q am for indigestion.  -Continue The Afrin Nasal spray per each nare bid. -Continue Lantus  insulin  10 units bid for DM.  -Continue the sliding scale insulin  as recommended per blood sugar results.   Other PRNS -Continue Tylenol  650 mg every 6 hours PRN for mild pain -Continue Maalox 30 ml Q 4 hrs PRN for indigestion -Continue MOM 30 ml po Q 6 hrs for constipation.    Safety and Monitoring: Voluntary admission to inpatient psychiatric unit for safety, stabilization and treatment Daily contact with patient to assess and evaluate symptoms and progress in treatment Patient's case to be discussed in multi-disciplinary team meeting Observation Level : q15 minute checks Vital signs: q12 hours Precautions:  Safety   Ellouise JAYSON Azure, FNP. 08/02/2024, 3:51 PM Patient ID: [name], female   DOB: Jul 15, 1971, 53 y.o.   MRN: [id number] Patient ID: Mayda Shippee, female   DOB: 31-Dec-1970, 53 y.o.   MRN: [id number]

## 2024-08-02 NOTE — Progress Notes (Signed)
   08/02/24 1600  Psych Admission Type (Psych Patients Only)  Admission Status Involuntary  Psychosocial Assessment  Patient Complaints Anxiety;Depression  Eye Contact Fair  Facial Expression Animated  Affect Appropriate to circumstance  Speech Logical/coherent  Interaction Assertive  Motor Activity Slow  Appearance/Hygiene Unremarkable  Behavior Characteristics Appropriate to situation  Mood Depressed  Thought Process  Coherency WDL  Content WDL  Delusions None reported or observed  Perception WDL  Hallucination None reported or observed  Judgment Poor  Confusion None  Danger to Self  Current suicidal ideation? Passive  Description of Suicide Plan No Plan  Self-Injurious Behavior No self-injurious ideation or behavior indicators observed or expressed   Agreement Not to Harm Self Yes  Description of Agreement Verbal  Danger to Others  Danger to Others None reported or observed

## 2024-08-02 NOTE — Progress Notes (Signed)
 Pt. Slept 9.5 hrs on night shift.

## 2024-08-02 NOTE — Group Note (Signed)
 LCSW Group Therapy Note   Group Date: 08/02/2024 Start Time: 1100 End Time: 1200   Participation:  did not attend  Type of Therapy:  Group Therapy  Topic:  Title: Finding Balance: Using Wise Mind for Thoughtful Decisions  Objective:  To help participants understand and apply the concept of Delsie Mind to make balanced, thoughtful decisions by integrating emotion and logic.  Goals: Learn the differences between Emotional Mind, Reasonable Mind, and [employer]. Recognize personal signs of Emotional and Reasonable Mind. Practice using [employer] in real-life scenarios.  Therapeutic Modalities: Elements of Dialectical Behavior Therapy (DBT):  Mindfulness (noticing thoughts and emotions without judgment), Emotion Regulation (understanding and managing emotional responses), Distress Tolerance (coping with difficult situations without making them worse), Wise Mind (integrating emotion and reason for balanced decision-making) Elements of Cognitive Behavioral Therapy (CBT):  Identifying automatic thoughts, Challenging cognitive distortions, Using logic to reframe unhelpful thinking patterns  Summary:  This class focused on Wise Mind--DBT's concept of balancing Emotional Mind and Reasonable Mind. We identified when we're in each state and practiced using Wise Mind to respond thoughtfully in real-life situations. By combining emotion and logic, participants can improve decision-making, manage challenges, and enhance relationships.   Taunja Brickner O Wylie Coon, LCSWA 08/02/2024  12:18 PM

## 2024-08-02 NOTE — Progress Notes (Signed)
   08/02/24 1645  Spiritual Encounters  Type of Visit Attempt (pt unavailable)   This was a second attempt to visit with Ms. Vanessa Barajas, per referral from Inocente Cassette, RN.  I will refer to spiritual care team and will continue to attempt to offer support.  Tya Haughey L. Delores HERO.Div

## 2024-08-02 NOTE — Plan of Care (Signed)
?  Problem: Education: ?Goal: Verbalization of understanding the information provided will improve ?Outcome: Progressing ?  ?Problem: Education: ?Goal: Emotional status will improve ?Outcome: Not Progressing ?Goal: Mental status will improve ?Outcome: Not Progressing ?  ?Problem: Activity: ?Goal: Interest or engagement in activities will improve ?Outcome: Not Progressing ?  ?

## 2024-08-02 NOTE — Progress Notes (Signed)
   08/01/24 2200  Psych Admission Type (Psych Patients Only)  Admission Status Involuntary  Psychosocial Assessment  Patient Complaints Anxiety;Depression  Eye Contact Fair  Facial Expression Animated  Affect Appropriate to circumstance  Speech Logical/coherent  Interaction Assertive  Motor Activity Slow  Appearance/Hygiene Unremarkable  Behavior Characteristics Appropriate to situation  Mood Depressed  Thought Process  Coherency WDL  Content WDL  Delusions None reported or observed  Perception WDL  Hallucination None reported or observed  Judgment Poor  Confusion None  Danger to Self  Current suicidal ideation? Passive  Self-Injurious Behavior No self-injurious ideation or behavior indicators observed or expressed   Agreement Not to Harm Self Yes  Description of Agreement Verbal  Danger to Others  Danger to Others None reported or observed

## 2024-08-03 LAB — GLUCOSE, CAPILLARY
Glucose-Capillary: 142 mg/dL — ABNORMAL HIGH (ref 70–99)
Glucose-Capillary: 152 mg/dL — ABNORMAL HIGH (ref 70–99)
Glucose-Capillary: 146 mg/dL — ABNORMAL HIGH (ref 70–99)
Glucose-Capillary: 173 mg/dL — ABNORMAL HIGH (ref 70–99)

## 2024-08-03 NOTE — Group Note (Signed)
 Date:  08/03/2024 Time:  4:34 PM  Group Topic/Focus:  Dimensions of Wellness: The focus of this group is to introduce the topic of wellness and discuss the role each dimension of wellness plays in total health.    Participation Level:  Did Not Attend   Vanessa Barajas 08/03/2024, 4:34 PM

## 2024-08-03 NOTE — Progress Notes (Signed)
(  Sleep Hours) - 10 (Any PRNs that were needed, meds refused, or side effects to meds)- none  (Any disturbances and when (visitation, over night)- none (Concerns raised by the patient)- none  (SI/HI/AVH)- denies

## 2024-08-03 NOTE — Progress Notes (Signed)
 [facility] MD Progress Note  08/03/2024 3:31 PM Vanessa Barajas  MRN:  [id number]  Reason for admission: This is the second psychiatric admission evaluation in the [facility]. Patient was admitted, treated & discharged from the [facility] behavioral unit on 07-16-24 after a suicide attempt on her insulin . Patient was discharged to follow-up care at the [facility] clinic. It is quite unknown if patient was keeping her psychiatric appointment or not. Patient is currently battling some chronic medical problems that included (obesity, DM, HTN). Per chart review, patient has not been compliant with her recommended treatment regimen. Vanessa Barajas is being admitted to the [facility] with complaint of suicide attempt by overdose on her Norvasc  tablets. After medical evaluation/stabilization, she was transferred to the [facility] for further psychiatric evaluation/treatments.   Daily notes: Vanessa Barajas is seen. Chart reviewed. The chart findings discussed with the treatment team. She presents alert, oriented & aware of situation. She is not very visible on the unit & not attending group sessions. She stays in her room most of the time lying down in bed. However, she does go to the cafeteria with the rest of the patients for means. She is currently mobilizing from her room to the cafeteri using the wheelchair due to lower extremity weakness. Patient is also obese. She reports, I'm not feeling too good this morning, I have had two loose bowels this morning. My mood is not good. I have not been able to find any agency to help me with my my bills. The place I called do not automatically pick-up the phone. You have to literarily wait on the phone till someone answers. But unfortunately, I have not been able to get anyone to answer the phone. I was on the phone for the longest time, still no luck. My mood is down today. I have not much anxiety symptoms. I sleep well at night. I have no side effects to my medications. I have not really attended much group sessions,  but will try to attend some group sessions this afternoon. I will try to see if my daughter will lend me some money to help me out. That will be my last resort at this time. Patient currently denies any SIHI, AVH, delusional thoughts or paranoia. She does not appear to be responding to any internal stimuli. She rates her depression #5. Rates anxiety #0. There are no changes made on the current plan of care. She declines to take some medication for the diarrhea for fear of getting constipated. Continue as already in progress.  Principal Problem: Major depressive disorder, recurrent severe without psychotic features (HCC)  Diagnosis: Principal Problem:   Major depressive disorder, recurrent severe without psychotic features (HCC)  Total Time spent with patient: 45 minutes  Past Psychiatric History: See H&P.  Past Medical History:  Past Medical History:  Diagnosis Date   Anxiety    Diabetes mellitus without complication (HCC)    Hemorrhoids    Hypertension     Past Surgical History:  Procedure Laterality Date   ACHILLES TENDON SURGERY     ADENOIDECTOMY     ECTOPIC PREGNANCY SURGERY     x2   HERNIA REPAIR     TONSILLECTOMY     TUBAL LIGATION     Family History:  Family History  Problem Relation Age of Onset   Leukemia Mother    Cancer Mother 59       leukemia   Hypertension Mother    Cancer Sister    Cancer Maternal Grandmother  breast   Family Psychiatric  History: See H&P.  Social History:  Social History   Substance and Sexual Activity  Alcohol Use No     Social History   Substance and Sexual Activity  Drug Use No    Social History   Socioeconomic History   Marital status: Divorced    Spouse name: Not on file   Number of children: Not on file   Years of education: Not on file   Highest education level: Not on file  Occupational History   Not on file  Tobacco Use   Smoking status: Former    Current packs/day: 0.00    Types: Cigarettes    Quit  date: 09/02/2013    Years since quitting: 10.9   Smokeless tobacco: Never  Vaping Use   Vaping status: Every Day  Substance and Sexual Activity   Alcohol use: No   Drug use: No   Sexual activity: Not Currently    Birth control/protection: Pill  Other Topics Concern   Not on file  Social History Narrative   Not on file   Social Drivers of Health   Financial Resource Strain: Low Risk  (02/02/2024)   Received from [facility]   Overall Financial Resource Strain (CARDIA)    Difficulty of Paying Living Expenses: Not hard at all  Food Insecurity: No Food Insecurity (07/30/2024)   Hunger Vital Sign    Worried About Running Out of Food in the Last Year: Never true    Ran Out of Food in the Last Year: Never true  Recent Concern: Food Insecurity - Food Insecurity Present (07/09/2024)   Hunger Vital Sign    Worried About Running Out of Food in the Last Year: Sometimes true    Ran Out of Food in the Last Year: Sometimes true  Transportation Needs: No Transportation Needs (07/30/2024)   PRAPARE - Administrator, Civil Service (Medical): No    Lack of Transportation (Non-Medical): No  Recent Concern: Transportation Needs - Unmet Transportation Needs (07/09/2024)   PRAPARE - Transportation    Lack of Transportation (Medical): Yes    Lack of Transportation (Non-Medical): Yes  Physical Activity: Inactive (02/02/2024)   Received from [facility]   Exercise Vital Sign    On average, how many days per week do you engage in moderate to strenuous exercise (like a brisk walk)?: 0 days    On average, how many minutes do you engage in exercise at this level?: 30 min  Stress: No Stress Concern Present (02/02/2024)   Received from [facility] - Occupational Stress Questionnaire    Feeling of Stress : Not at all  Social Connections: Socially Integrated (02/02/2024)   Received from [facility]   Social Network    How would you rate your social  network (family, work, friends)?: Good participation with social networks   Additional Social History:    Sleep: Good Estimated Sleeping Duration (Last 24 Hours): 8.75-9.25 hours  Appetite:  Good  Current Medications: Current Facility-Administered Medications  Medication Dose Route Frequency Provider Last Rate Last Admin   acetaminophen  (TYLENOL ) tablet 650 mg  650 mg Oral Q6H PRN Lord, Jamison Y, NP       alum & mag hydroxide-simeth (MAALOX/MYLANTA) 200-200-20 MG/5ML suspension 30 mL  30 mL Oral Q4H PRN Jacquetta Sharlot GRADE, NP       ARIPiprazole  (ABILIFY ) tablet 15 mg  15 mg Oral Daily Jacquetta Sharlot GRADE, NP   15  mg at 08/03/24 0858   clonazePAM  (KLONOPIN ) tablet 1 mg  1 mg Oral BID Jacquetta Sharlot GRADE, NP   1 mg at 08/03/24 0857   fluticasone  (FLONASE ) 50 MCG/ACT nasal spray 1 spray  1 spray Each Nare Daily Prentis Kitchens A, DO   1 spray at 08/03/24 0857   insulin  aspart (novoLOG ) injection 0-15 Units  0-15 Units Subcutaneous TID WC Lord, Jamison Y, NP   3 Units at 08/03/24 1204   insulin  aspart (novoLOG ) injection 0-5 Units  0-5 Units Subcutaneous QHS Jacquetta Sharlot GRADE, NP   2 Units at 08/02/24 2123   insulin  glargine (LANTUS ) injection 10 Units  10 Units Subcutaneous BID Lord, Jamison Y, NP   10 Units at 08/03/24 9140   magnesium  hydroxide (MILK OF MAGNESIA) suspension 30 mL  30 mL Oral Daily PRN Jacquetta Sharlot GRADE, NP       nicotine  (NICODERM CQ  - dosed in mg/24 hours) patch 14 mg  14 mg Transdermal Daily Jacquetta Sharlot GRADE, NP   14 mg at 08/03/24 9097   OLANZapine  (ZYPREXA ) injection 5 mg  5 mg Intramuscular TID PRN Jacquetta Sharlot GRADE, NP       pantoprazole  (PROTONIX ) EC tablet 40 mg  40 mg Oral Daily Jacquetta Sharlot GRADE, NP   40 mg at 08/03/24 9142   sertraline  (ZOLOFT ) tablet 200 mg  200 mg Oral Daily Lord, Jamison Y, NP   200 mg at 08/03/24 9141   Lab Results:  Results for orders placed or performed during the hospital encounter of 07/29/24 (from the past 48 hours)  Glucose, capillary     Status:  Abnormal   Collection Time: 08/01/24  5:13 PM  Result Value Ref Range   Glucose-Capillary 153 (H) 70 - 99 mg/dL    Comment: Glucose reference range applies only to samples taken after fasting for at least 8 hours.  Glucose, capillary     Status: Abnormal   Collection Time: 08/01/24  8:46 PM  Result Value Ref Range   Glucose-Capillary 160 (H) 70 - 99 mg/dL    Comment: Glucose reference range applies only to samples taken after fasting for at least 8 hours.  Glucose, capillary     Status: Abnormal   Collection Time: 08/02/24  6:22 AM  Result Value Ref Range   Glucose-Capillary 136 (H) 70 - 99 mg/dL    Comment: Glucose reference range applies only to samples taken after fasting for at least 8 hours.  Glucose, capillary     Status: Abnormal   Collection Time: 08/02/24 11:31 AM  Result Value Ref Range   Glucose-Capillary 184 (H) 70 - 99 mg/dL    Comment: Glucose reference range applies only to samples taken after fasting for at least 8 hours.  Glucose, capillary     Status: Abnormal   Collection Time: 08/02/24  4:39 PM  Result Value Ref Range   Glucose-Capillary 178 (H) 70 - 99 mg/dL    Comment: Glucose reference range applies only to samples taken after fasting for at least 8 hours.  Glucose, capillary     Status: Abnormal   Collection Time: 08/02/24  8:25 PM  Result Value Ref Range   Glucose-Capillary 226 (H) 70 - 99 mg/dL    Comment: Glucose reference range applies only to samples taken after fasting for at least 8 hours.  Glucose, capillary     Status: Abnormal   Collection Time: 08/03/24  6:19 AM  Result Value Ref Range   Glucose-Capillary 142 (H) 70 - 99  mg/dL    Comment: Glucose reference range applies only to samples taken after fasting for at least 8 hours.   Comment 1 Notify RN    Comment 2 Document in Chart   Glucose, capillary     Status: Abnormal   Collection Time: 08/03/24 11:35 AM  Result Value Ref Range   Glucose-Capillary 152 (H) 70 - 99 mg/dL    Comment: Glucose  reference range applies only to samples taken after fasting for at least 8 hours.   Blood Alcohol level:  Lab Results  Component Value Date   [facility] <15 07/09/2024   Metabolic Disorder Labs: Lab Results  Component Value Date   HGBA1C 11.5 (H) 07/13/2024   MPG 283 07/13/2024   MPG 280.48 07/10/2024   No results found for: PROLACTIN Lab Results  Component Value Date   CHOL 206 (H) 07/13/2024   TRIG 140 07/13/2024   HDL 40 (L) 07/13/2024   CHOLHDL 5.2 07/13/2024   VLDL 28 07/13/2024   LDLCALC 138 (H) 07/13/2024   LDLCALC UNABLE TO CALCULATE IF TRIGLYCERIDE OVER 400 mg/dL 90/78/7987   Physical Findings: AIMS:  ,  ,  ,  ,  ,  ,   CIWA:    COWS:     Musculoskeletal: Strength & Muscle Tone: within normal limits Gait & Station: normal Patient leans: N/A  Psychiatric Specialty Exam:  Presentation  General Appearance:  Casual  Eye Contact: Fair  Speech: Clear and Coherent  Speech Volume: Normal  Handedness: Right  Mood and Affect  Mood: Depressed; Anxious  Affect: Congruent  Thought Process  Thought Processes: Coherent  Descriptions of Associations:Intact  Orientation:Full (Time, Place and Person)  Thought Content: Logical  History of Schizophrenia/Schizoaffective disorder:No data recorded Duration of Psychotic Symptoms:No data recorded Hallucinations:Hallucinations: None  Ideas of Reference:None  Suicidal Thoughts:Suicidal Thoughts: Yes, Passive SI Passive Intent and/or Plan: Without Intent; Without Plan; Without Means to Carry Out; Without Access to Means  Homicidal Thoughts:Homicidal Thoughts: No  Sensorium  Memory: Immediate Fair; Recent Fair  Judgment: Fair  Insight: Fair  Art therapist  Concentration: Fair  Attention Span: Fair  Recall: [employer] of Knowledge: Fair  Language: Good  Psychomotor Activity  Psychomotor Activity: Psychomotor Activity: Decreased (Consult ordered for OT/cognitive  services.)  Assets  Assets: Communication Skills; Physical Health; Resilience  Sleep  Sleep: Sleep: Good Number of Hours of Sleep: 9.5  Physical Exam: Physical Exam Vitals and nursing note reviewed.  Constitutional:      Appearance: She is obese.  HENT:     Head: Normocephalic.     Right Ear: External ear normal.     Left Ear: External ear normal.     Nose: Nose normal.     Mouth/Throat:     Mouth: Mucous membranes are moist.     Pharynx: Oropharynx is clear.  Eyes:     Extraocular Movements: Extraocular movements intact.  Cardiovascular:     Rate and Rhythm: Normal rate.     Pulses: Normal pulses.     Comments: Currently using wheel chair due to lower extremity weakness. Pulmonary:     Effort: Pulmonary effort is normal.  Abdominal:     Comments: Deferred  Genitourinary:    Comments: Deferred. Musculoskeletal:        General: Normal range of motion.     Cervical back: Normal range of motion.  Skin:    General: Skin is dry.  Neurological:     General: No focal deficit present.     Mental  Status: She is alert and oriented to person, place, and time.  Psychiatric:        Mood and Affect: Mood normal.        Behavior: Behavior normal.    Review of Systems  Constitutional:  Negative for chills, diaphoresis and fever.  HENT:  Negative for congestion and sore throat.   Eyes:  Negative for blurred vision.  Respiratory:  Negative for cough, shortness of breath and wheezing.   Cardiovascular:  Negative for chest pain and palpitations.  Gastrointestinal:  Negative for abdominal pain, diarrhea, heartburn, nausea and vomiting.  Genitourinary:  Negative for dysuria.  Musculoskeletal:        Currently using wheel chair due to lower extremity weakness.  Neurological:  Negative for dizziness, tingling, tremors, sensory change, speech change, focal weakness, seizures, loss of consciousness, weakness and headaches.  Endo/Heme/Allergies:        Allergies: Lisinopril,  Losartan.  Psychiatric/Behavioral:  Positive for depression. Negative for hallucinations, memory loss and substance abuse. Suicidal ideas: Hx of.The patient is nervous/anxious and has insomnia.    Blood pressure (!) 148/91, pulse 99, temperature 98.1 F (36.7 C), temperature source Oral, resp. rate 20, height 5' 7 (1.702 m), weight 123.1 kg, SpO2 96%. Body mass index is 42.51 kg/m.  Treatment Plan Summary: Daily contact with patient to assess and evaluate symptoms and progress in treatment and Medication management.  Principal/active diagnoses.  Major depressive disorder, recurrent severe without psychotic features (HCC).    Medical. Diabetes mellitus.  HTN.  Plan: The risks/benefits/side-effects/alternatives to the medications in use were discussed in detail with the patient and time was given for patient's  questions. The patient consents to medication trial.    -Continue Abilify  15 mg po daily for mood control.  -Continue Clonazepam  1 mg po bid for anxiety.  -Continue Sertraline  200 mg po daily for depression.  -Continue Nicotine  patch 14 mg trans-dermally for nicotine  withdrawal.   Agitation protocols.  -Continue as recommended.    Medical:  -Continue Protonix  40 mg po Q am for indigestion.  -Continue The Afrin Nasal spray per each nare bid. -Continue Lantus  insulin  10 units bid for DM.  -Continue the sliding scale insulin  as recommended per blood sugar results.   Other PRNS -Continue Tylenol  650 mg every 6 hours PRN for mild pain -Continue Maalox 30 ml Q 4 hrs PRN for indigestion -Continue MOM 30 ml po Q 6 hrs for constipation.    Safety and Monitoring: Voluntary admission to inpatient psychiatric unit for safety, stabilization and treatment Daily contact with patient to assess and evaluate symptoms and progress in treatment Patient's case to be discussed in multi-disciplinary team meeting Observation Level : q15 minute checks Vital signs: q12 hours Precautions:  Safety   Mac Bolster, NP. 08/03/2024, 3:31 PM Patient ID: [name], female   DOB: 1971/04/02, 52 y.o.   MRN: [id number] Patient ID: Nanako Stopher, female   DOB: 1971-10-01, 53 y.o.   MRN: [id number] Patient ID: Chesley Valls, female   DOB: 09/10/1971, 53 y.o.   MRN: [id number]

## 2024-08-03 NOTE — Group Note (Signed)
 Recreation Therapy Group Note   Group Topic:Team Building  Group Date: 08/03/2024 Start Time: 0930 End Time: 1000 Facilitators: Santiago Stenzel-McCall, LRT,CTRS Location: [address] Dayroom   Group Topic: Communication, Team Building, Problem Solving  Goal Area(s) Addresses:  Patient will effectively work with peer towards shared goal.  Patient will identify skills used to make activity successful.  Patient will share challenges and verbalize solution-driven approaches used. Patient will identify how skills used during activity can be used to reach post d/c goals.   Behavioral Response:   Intervention: STEM Activity   Activity: [employer]. Patients were provided the following materials: 5 drinking straws, 5 rubber bands, 5 paper clips, 2 index cards, 2 drinking cups, and 2 toilet paper rolls. Using the provided materials patients were asked to build a launching mechanism to launch a ping pong ball across the room, approximately 10 feet. Patients were divided into teams of 3-5. Instructions required all materials be incorporated into the device, functionality of items left to the peer group's discretion.  Education: Pharmacist, community, Scientist, physiological, Air cabin crew, Building control surveyor.   Education Outcome: Acknowledges education/In group clarification offered/Needs additional education.    Affect/Mood: N/A   Participation Level: Did not attend    Clinical Observations/Individualized Feedback:     Plan: Continue to engage patient in RT group sessions 2-3x/week.   Carsin Randazzo-McCall, LRT,CTRS 08/03/2024 12:07 PM

## 2024-08-03 NOTE — [facility] Group Notes (Signed)
 Psychoeducational Group Note  Date:  08/03/2024 Time:  2130  Group Topic/Focus:  Recovery Goals:   The focus of this group is to identify appropriate goals for recovery and establish a plan to achieve them.  Participation Level: Did Not Attend  Participation Quality:  Not Applicable  Affect:  Not Applicable  Cognitive:  Not Applicable  Insight:  Not Applicable  Engagement in Group: Not Applicable  Additional Comments:  The patient did not attend the evening group.   Asani Deniston S 08/03/2024, 9:30 PM

## 2024-08-03 NOTE — Plan of Care (Signed)
   Problem: Education: Goal: Emotional status will improve Outcome: Not Progressing Goal: Mental status will improve Outcome: Not Progressing   Problem: Activity: Goal: Interest or engagement in activities will improve Outcome: Not Progressing

## 2024-08-03 NOTE — Progress Notes (Signed)
   08/03/24 1500  Psych Admission Type (Psych Patients Only)  Admission Status Involuntary  Psychosocial Assessment  Patient Complaints Anxiety;Depression  Eye Contact Fair  Facial Expression Animated  Affect Appropriate to circumstance  Speech Logical/coherent  Interaction Assertive  Motor Activity Slow  Appearance/Hygiene Unremarkable  Behavior Characteristics Appropriate to situation  Mood Depressed  Thought Process  Coherency WDL  Content WDL  Delusions None reported or observed  Perception WDL  Hallucination None reported or observed  Judgment Poor  Confusion None  Danger to Self  Current suicidal ideation? Plan  Description of Suicide Plan No Plan  Self-Injurious Behavior No self-injurious ideation or behavior indicators observed or expressed   Agreement Not to Harm Self Yes  Description of Agreement Verbal  Danger to Others  Danger to Others None reported or observed

## 2024-08-03 NOTE — Evaluation (Signed)
 Occupational Therapy Evaluation Patient Details Name: Vanessa Barajas MRN: [id number] DOB: September 06, 1971 Today's Date: 08/03/2024   History of Present Illness   History of Present Illness: This is the second psychiatric admission evaluation in the [facility] health system. Patient was admitted, treated & discharged from the [facility] behavioral unit on 07-16-24 after a suicide attempt on her insulin . Patient was discharged to follow-up care at the [facility] clinic. It is quite unknown if patient was keeping her psychiatric appointment or not. Patient is currently battling some chronic medical problems that included (obesity, DM, HTN). Per chart review, patient has not been compliant with her recommended treatment regimen. Vanessa Barajas is being admitted to the [facility] with complaint of suicide attempt by overdose on her Norvasc  tablets. After medical evaluation/stabilization, she was transferred to the [facility] for further psychiatric evaluation/treatments. During this evaluation, patient reports,      I was at the [facility] for two days prior to being transferred to this place. I had overdosed on my blood pressure pills called Norvasc . I did it on purpose to kill myself. I have been on medical leave from work. However, my PTO days are now used up. I could no longer pay my bills. This is the second time that I had overdosed in 3 weeks. I have been feeling very depressed for one year. The reason for my depression is because I feel alone. I have no support system. I have only one daughter, but we are estranged. I'm just weak & tired. I could not sleep at all last night. My sinuses feels clogged up. I desperately need the case walker so that I can inquire about my rent & how I can get some help to pay it. I have no money for food. My depression currently is #10 & anxiety #10.     Clinical Impressions OT was consulted to assess pt's current level of function as there are concerns related to imbalance and cognition. OT performed as  assessment of global cognitive function using the MoCA v 8.1 and the Allen cognitive Level Screen and the [facility] Scale:   Objective MoCA total raw score 24 out of 30. Memory Index Score 13 out of 15. Allen Cognitive Level 5.4; [place]: IX - Purposeful and appropriate w/ stand by assist on request.  Section by section breakdown with task-level scoring Visuospatial / Executive 4 of 5  Trail making alternating lines 1 of 1 earned.  Cube copy 0 of 1 lost.  Clock drawing 3 of 3 earned.  Naming 3 of 3  Three animal naming 3 of 3 earned.  Attention 3 of 6  Digit span forward and backward 2 of 2 earned.  Vigilance tapping 1 of 1 earned.  Serial 7 subtraction 0 of 3 lost all 3 points.  Language 3 of 3  Sentence repetition 2 of 2 earned.  Verbal fluency 1 of 1 earned.  Abstraction 2 of 2  Two similarity items 2 of 2 earned.  Delayed recall 3 of 5  Spontaneous free recall 3 of 5 earned.  Two words required category cues to be recalled.  Orientation 6 of 6  All orientation items 6 of 6 earned.  Summary of objective findings  Total MoCA 24/30.  MIS 13/15 indicating relatively preserved encoding and recognition with partial difficulty in free recall.  Biggest losses were in sustained calculation/attention on Serial 7s and one visuospatial construction error on cube copy.  Assessment  Overall cognitive profile consistent with mild cognitive deficits. Total  MoCA 24 is below typical community cutoff and shows selective weakness rather than diffuse global failure.   Primary weaknesses: attention for complex mental calculations and visuospatial construction on one task. Secondary, modest retrieval difficulty on delayed recall that responds to semantic cues. MIS 13 of 15 supports that learning and recognition are mostly intact but free recall is reduced.   Allen Cognitive Level 5.4 fits the pattern. The patient can learn new tasks through trial and error and follow routines  but will struggle when tasks require multi-step planning, sustained mental calculation, or anticipating multiple contingencies. Reading comprehension is largely intact.  Functional implications for daily tasks   Tasks requiring sustained concentration and mental manipulation such as bill paying, balancing a checkbook, long shopping lists, keeping track of appointments while multitasking, and following complex medication regimens are at higher risk of errors.   Memory performance suggests patient will often remember after a cue or with prompts but may forget items when asked to recall spontaneously. Relying on recognition or external prompts works well.   Visuospatial weakness on cube copy suggests potential difficulty with complex spatial tasks such as assembly of unfamiliar objects, interpreting maps, or following multi-step visual instructions. This is not a catastrophic impairment, but it can slow or complicate these activities.   With an ACL of 5.4 the patient is capable of independent basic self-care and learning compensatory strategies but may need structured supports for higher level instrumental activities of daily living.    If plan is discharge home, recommend the following:   Other (comment) (see PTs notes regarding mobility concerns related to mobility)     Functional Status Assessment   Patient has not had a recent decline in their functional status (this is only from the perspective of OT as it relates to bADLs and cognitive function / mobility FSA may vary significantly d/t new complications w/ walking and standing)     Equipment Recommendations         Recommendations for Other Services   PT consult     Precautions/Restrictions         Mobility Bed Mobility Overal bed mobility: Independent             General bed mobility comments: independent for bed mobility    Transfers                   General transfer comment: pt asked to defer OOB  transfers today d/t pain      Balance                                           ADL either performed or assessed with clinical judgement   ADL Overall ADL's : Modified independent;At baseline                                       General ADL Comments: pt appears to be operating at her normal baseline for basic ADLs which are limited by chronic bilateral knee pain that worsens w/ standing / ambulation     Vision         Perception         Praxis         Pertinent Vitals/Pain Pain Assessment Pain Assessment: 0-10 Pain Score: 5  Pain Location: knees / bilateral /  chronic     Extremity/Trunk Assessment             Communication Communication Communication: No apparent difficulties   Cognition Arousal: Alert Behavior During Therapy: WFL for tasks assessed/performed               OT - Cognition Comments: MOCA: 24/30; MIS (memory index score): 13/15; Allen Cognitive Level: 5.4 - these scores collectively suggest only MCI / please see OT notes for a comprehensive analysis of pt's performance and score results                 Following commands: Intact       Cueing  General Comments      unable to assess OOB balance however pt is able to sit at EOB and L sit or long sitting in bed w/ intact core / trunk control w/o evidence of imbalance or sway or the need for support   Exercises     Shoulder Instructions      Home Living Family/patient expects to be discharged to:: Private residence Living Arrangements: Alone                                      Prior Functioning/Environment Prior Level of Function : Independent/Modified Independent             Mobility Comments: c/o bilateral knee pain which is chronic and worsens w/ activity. D/t pain pt only engaged in bed level assessment on this date. Pt would benefit from a PT consult. ADLs Comments: operating at baseline for ADLs / MOD I w/  additional time    OT Problem List: Pain;Decreased cognition    AM-PAC OT 6 Clicks Daily Activity     Outcome Measure Help from another person eating meals?: None Help from another person taking care of personal grooming?: None Help from another person toileting, which includes using toliet, bedpan, or urinal?: None Help from another person bathing (including washing, rinsing, drying)?: None Help from another person to put on and taking off regular upper body clothing?: None Help from another person to put on and taking off regular lower body clothing?: None 6 Click Score: 24   End of Session    Activity Tolerance: Patient limited by pain Patient left: in bed  OT Visit Diagnosis: Unsteadiness on feet (R26.81);Other symptoms and signs involving cognitive function                Time: 8493-8463 OT Time Calculation (min): 30 min Charges:  OT General Charges $OT Visit: 1 Visit OT Evaluation $OT Eval Low Complexity: 1 Low OT Treatments $Cognitive Funtion inital: Initial 15 mins $Cognitive Funtion additional: Additional15 mins  Erby Sanderson, OT   Alessandro Griep G Denilson Salminen 08/03/2024, 6:13 PM

## 2024-08-03 NOTE — Plan of Care (Signed)
   Problem: Education: Goal: Knowledge of [facility] Education information/materials will improve Outcome: Progressing Goal: Emotional status will improve Outcome: Progressing Goal: Mental status will improve Outcome: Progressing Goal: Verbalization of understanding the information provided will improve Outcome: Progressing

## 2024-08-04 LAB — GLUCOSE, CAPILLARY
Glucose-Capillary: 148 mg/dL — ABNORMAL HIGH (ref 70–99)
Glucose-Capillary: 163 mg/dL — ABNORMAL HIGH (ref 70–99)
Glucose-Capillary: 163 mg/dL — ABNORMAL HIGH (ref 70–99)
Glucose-Capillary: 205 mg/dL — ABNORMAL HIGH (ref 70–99)

## 2024-08-04 NOTE — Group Note (Signed)
 Date:  08/04/2024 Time:  9:30 AM  Group Topic/Focus:  Goals Group:   The focus of this group is to help patients establish daily goals to achieve during treatment and discuss how the patient can incorporate goal setting into their daily lives to aide in recovery.    Participation Level:  Did Not Attend  Participation Quality:  na  Affect:  na  Cognitive:  na  Insight: None  Engagement in Group:  na  Modes of Intervention:  na  Additional Comments:  na  Nat Rummer 08/04/2024, 9:30 AM

## 2024-08-04 NOTE — [facility] Group Notes (Signed)
 Adult Psychoeducational Group Note  Date:  08/04/2024 Time:  9:07 PM  Group Topic/Focus:  Wrap-Up Group:   The focus of this group is to help patients review their daily goal of treatment and discuss progress on daily workbooks.  Participation Level:  Did Not Attend  Vanessa Barajas 08/04/2024, 9:07 PM

## 2024-08-04 NOTE — [facility] Suicide Risk Assessment (Signed)
 [facility] INPATIENT:  Family/Significant Other Suicide Prevention Education  Suicide Prevention Education:  Contact Attempts: Vanessa Barajas (daughter) [phone], (name of family member/significant other) has been identified by the patient as the family member/significant other who will aid the patient in the event of a mental health crisis.  With written consent from the patient, one attempt was made to provide suicide prevention education, prior to and/or following the patient's discharge.  We were unsuccessful in providing suicide prevention education, CSW will attempt again prior to patient discharge.  Date and time of first attempt: 08/04/24 / 3:02 pm  Vanessa Barajas 08/04/2024, 3:48 PM

## 2024-08-04 NOTE — Group Note (Signed)
 Recreation Therapy Group Note   Group Topic:Other  Group Date: 08/04/2024 Start Time: 1307 End Time: 1346 Facilitators: Keerthana Vanrossum-McCall, LRT,CTRS Location: [address] Dayroom   Activity Description/Intervention: Therapeutic Drumming. Patients with peers and staff were given the opportunity to engage in a leader facilitated [facility] Empowerment Drumming Circle with staff from the [employer], in partnership with [employer]. Teaching laboratory technician and trained [employer], Norleen Mon leading with LRT observing and documenting intervention and pt response. This evidenced-based practice targets 7 areas of health and wellbeing in the human experience including: stress-reduction, exercise, self-expression, camaraderie/support, nurturing, spirituality, and music-making (leisure).   Goal Area(s) Addresses:  Patient will engage in pro-social way in music group.  Patient will follow directions of drum leader on the first prompt. Patient will demonstrate no behavioral issues during group.  Patient will identify if a reduction in stress level occurs as a result of participation in therapeutic drum circle.    Education: Leisure exposure, Pharmacologist, Musical expression, Discharge Planning   Affect/Mood: N/A   Participation Level: Did not attend    Clinical Observations/Individualized Feedback:      Plan: Continue to engage patient in RT group sessions 2-3x/week.   Tiffaney Heimann-McCall, LRT,CTRS 08/04/2024 2:21 PM

## 2024-08-04 NOTE — Plan of Care (Signed)
   Problem: Education: Goal: Emotional status will improve Outcome: Progressing Goal: Mental status will improve Outcome: Progressing Goal: Verbalization of understanding the information provided will improve Outcome: Progressing

## 2024-08-04 NOTE — Plan of Care (Signed)
   Problem: Education: Goal: Emotional status will improve Outcome: Progressing Goal: Mental status will improve Outcome: Progressing Goal: Verbalization of understanding the information provided will improve Outcome: Progressing   Problem: Activity: Goal: Interest or engagement in activities will improve Outcome: Progressing

## 2024-08-04 NOTE — Plan of Care (Signed)

## 2024-08-04 NOTE — Progress Notes (Signed)
 [facility] MD Progress Note  08/04/2024 3:22 PM Vanessa Barajas  MRN:  [id number]  Reason for admission: This is the second psychiatric admission evaluation in the [facility] health system. Patient was admitted, treated & discharged from the [facility] behavioral unit on 07-16-24 after a suicide attempt on her insulin . Patient was discharged to follow-up care at the [facility] clinic. It is quite unknown if patient was keeping her psychiatric appointment or not. Patient is currently battling some chronic medical problems that included (obesity, DM, HTN). Per chart review, patient has not been compliant with her recommended treatment regimen. [name] is being admitted to the [facility] with complaint of suicide attempt by overdose on her Norvasc  tablets. After medical evaluation/stabilization, she was transferred to the [facility] for further psychiatric evaluation/treatments.   Daily notes: Vanessa Barajas is seen in her room this morning. Chart reviewed. The chart findings discussed with the treatment team. She presents alert, oriented & aware of situation. She is still not very visible on the unit & not attending group sessions. She stays in her room most of the time lying down in bed. However, she does go to the cafeteria with the rest of the patients for meals. She is currently mobilizing from her room to the cafeteria using the wheelchair due to lower extremity weakness. Patient is also obese. She reports, I'm doing better emotionally. The only thing is that my stomach is still messing with me. It is like everything I eat goes through me. I'm to see a physical therapist today to evaluate me. I also feel that I'm feeling better enough to think about going home. The SW says will call my daughter today to see if she will help me out by lending me some money to pay my bills. If she says no, I do not know what I'm going to do. Vanessa Barajas currently denies any SIHI, AVH, delusional thoughts or paranoia. She does not appear to be responding to any internal stimuli. She  rates her depression #4. Rates anxiety #0. There are no changes made on the current plan of care. Continue as already in progress. Vital signs, stable.   Principal Problem: Major depressive disorder, recurrent severe without psychotic features (HCC)  Diagnosis: Principal Problem:   Major depressive disorder, recurrent severe without psychotic features (HCC)  Total Time spent with patient: 45 minutes  Past Psychiatric History: See H&P.  Past Medical History:  Past Medical History:  Diagnosis Date   Anxiety    Diabetes mellitus without complication (HCC)    Hemorrhoids    Hypertension     Past Surgical History:  Procedure Laterality Date   ACHILLES TENDON SURGERY     ADENOIDECTOMY     ECTOPIC PREGNANCY SURGERY     x2   HERNIA REPAIR     TONSILLECTOMY     TUBAL LIGATION     Family History:  Family History  Problem Relation Age of Onset   Leukemia Mother    Cancer Mother 5       leukemia   Hypertension Mother    Cancer Sister    Cancer Maternal Grandmother        breast   Family Psychiatric  History: See H&P.  Social History:  Social History   Substance and Sexual Activity  Alcohol Use No     Social History   Substance and Sexual Activity  Drug Use No    Social History   Socioeconomic History   Marital status: Divorced    Spouse name: Not on file  Number of children: Not on file   Years of education: Not on file   Highest education level: Not on file  Occupational History   Not on file  Tobacco Use   Smoking status: Former    Current packs/day: 0.00    Types: Cigarettes    Quit date: 09/02/2013    Years since quitting: 10.9   Smokeless tobacco: Never  Vaping Use   Vaping status: Every Day  Substance and Sexual Activity   Alcohol use: No   Drug use: No   Sexual activity: Not Currently    Birth control/protection: Pill  Other Topics Concern   Not on file  Social History Narrative   Not on file   Social Drivers of Health   Financial  Resource Strain: Low Risk  (02/02/2024)   Received from [facility]   Overall Financial Resource Strain (CARDIA)    Difficulty of Paying Living Expenses: Not hard at all  Food Insecurity: No Food Insecurity (07/30/2024)   Hunger Vital Sign    Worried About Running Out of Food in the Last Year: Never true    Ran Out of Food in the Last Year: Never true  Recent Concern: Food Insecurity - Food Insecurity Present (07/09/2024)   Hunger Vital Sign    Worried About Running Out of Food in the Last Year: Sometimes true    Ran Out of Food in the Last Year: Sometimes true  Transportation Needs: No Transportation Needs (07/30/2024)   PRAPARE - Administrator, Civil Service (Medical): No    Lack of Transportation (Non-Medical): No  Recent Concern: Transportation Needs - Unmet Transportation Needs (07/09/2024)   PRAPARE - Transportation    Lack of Transportation (Medical): Yes    Lack of Transportation (Non-Medical): Yes  Physical Activity: Inactive (02/02/2024)   Received from [facility]   Exercise Vital Sign    On average, how many days per week do you engage in moderate to strenuous exercise (like a brisk walk)?: 0 days    On average, how many minutes do you engage in exercise at this level?: 30 min  Stress: No Stress Concern Present (02/02/2024)   Received from [facility] - Occupational Stress Questionnaire    Feeling of Stress : Not at all  Social Connections: Socially Integrated (02/02/2024)   Received from [facility]   Social Network    How would you rate your social network (family, work, friends)?: Good participation with social networks   Additional Social History:    Sleep: Good Estimated Sleeping Duration (Last 24 Hours): 7.75-8.25 hours  Appetite:  Good  Current Medications: Current Facility-Administered Medications  Medication Dose Route Frequency Provider Last Rate Last Admin   acetaminophen  (TYLENOL ) tablet 650 mg   650 mg Oral Q6H PRN Jacquetta Sharlot GRADE, NP       alum & mag hydroxide-simeth (MAALOX/MYLANTA) 200-200-20 MG/5ML suspension 30 mL  30 mL Oral Q4H PRN Jacquetta Sharlot GRADE, NP       ARIPiprazole  (ABILIFY ) tablet 15 mg  15 mg Oral Daily Jacquetta Sharlot GRADE, NP   15 mg at 08/04/24 0848   clonazePAM  (KLONOPIN ) tablet 1 mg  1 mg Oral BID Jacquetta Sharlot GRADE, NP   1 mg at 08/04/24 0848   fluticasone  (FLONASE ) 50 MCG/ACT nasal spray 1 spray  1 spray Each Nare Daily Prentis Kitchens A, DO   1 spray at 08/04/24 0849   insulin  aspart (novoLOG ) injection 0-15 Units  0-15 Units  Subcutaneous TID WC Lord, Jamison Y, NP   3 Units at 08/04/24 1203   insulin  aspart (novoLOG ) injection 0-5 Units  0-5 Units Subcutaneous QHS Jacquetta Sharlot GRADE, NP   2 Units at 08/02/24 2123   insulin  glargine (LANTUS ) injection 10 Units  10 Units Subcutaneous BID Lord, Jamison Y, NP   10 Units at 08/04/24 9148   magnesium  hydroxide (MILK OF MAGNESIA) suspension 30 mL  30 mL Oral Daily PRN Jacquetta Sharlot GRADE, NP       nicotine  (NICODERM CQ  - dosed in mg/24 hours) patch 14 mg  14 mg Transdermal Daily Jacquetta Sharlot GRADE, NP   14 mg at 08/03/24 9097   OLANZapine  (ZYPREXA ) injection 5 mg  5 mg Intramuscular TID PRN Jacquetta Sharlot GRADE, NP       pantoprazole  (PROTONIX ) EC tablet 40 mg  40 mg Oral Daily Jacquetta Sharlot GRADE, NP   40 mg at 08/04/24 9150   sertraline  (ZOLOFT ) tablet 200 mg  200 mg Oral Daily Lord, Jamison Y, NP   200 mg at 08/04/24 9151   Lab Results:  Results for orders placed or performed during the hospital encounter of 07/29/24 (from the past 48 hours)  Glucose, capillary     Status: Abnormal   Collection Time: 08/02/24  4:39 PM  Result Value Ref Range   Glucose-Capillary 178 (H) 70 - 99 mg/dL    Comment: Glucose reference range applies only to samples taken after fasting for at least 8 hours.  Glucose, capillary     Status: Abnormal   Collection Time: 08/02/24  8:25 PM  Result Value Ref Range   Glucose-Capillary 226 (H) 70 - 99 mg/dL    Comment:  Glucose reference range applies only to samples taken after fasting for at least 8 hours.  Glucose, capillary     Status: Abnormal   Collection Time: 08/03/24  6:19 AM  Result Value Ref Range   Glucose-Capillary 142 (H) 70 - 99 mg/dL    Comment: Glucose reference range applies only to samples taken after fasting for at least 8 hours.   Comment 1 Notify RN    Comment 2 Document in Chart   Glucose, capillary     Status: Abnormal   Collection Time: 08/03/24 11:35 AM  Result Value Ref Range   Glucose-Capillary 152 (H) 70 - 99 mg/dL    Comment: Glucose reference range applies only to samples taken after fasting for at least 8 hours.  Glucose, capillary     Status: Abnormal   Collection Time: 08/03/24  4:51 PM  Result Value Ref Range   Glucose-Capillary 146 (H) 70 - 99 mg/dL    Comment: Glucose reference range applies only to samples taken after fasting for at least 8 hours.  Glucose, capillary     Status: Abnormal   Collection Time: 08/03/24  8:27 PM  Result Value Ref Range   Glucose-Capillary 173 (H) 70 - 99 mg/dL    Comment: Glucose reference range applies only to samples taken after fasting for at least 8 hours.   Comment 1 Notify RN    Comment 2 Document in Chart   Glucose, capillary     Status: Abnormal   Collection Time: 08/04/24  5:50 AM  Result Value Ref Range   Glucose-Capillary 148 (H) 70 - 99 mg/dL    Comment: Glucose reference range applies only to samples taken after fasting for at least 8 hours.   Comment 1 Notify RN    Comment 2 Document in Chart  Glucose, capillary     Status: Abnormal   Collection Time: 08/04/24 11:42 AM  Result Value Ref Range   Glucose-Capillary 163 (H) 70 - 99 mg/dL    Comment: Glucose reference range applies only to samples taken after fasting for at least 8 hours.   Blood Alcohol level:  Lab Results  Component Value Date   [facility] <15 07/09/2024   Metabolic Disorder Labs: Lab Results  Component Value Date   HGBA1C 11.5 (H) 07/13/2024   MPG  283 07/13/2024   MPG 280.48 07/10/2024   No results found for: PROLACTIN Lab Results  Component Value Date   CHOL 206 (H) 07/13/2024   TRIG 140 07/13/2024   HDL 40 (L) 07/13/2024   CHOLHDL 5.2 07/13/2024   VLDL 28 07/13/2024   LDLCALC 138 (H) 07/13/2024   LDLCALC UNABLE TO CALCULATE IF TRIGLYCERIDE OVER 400 mg/dL 90/78/7987   Physical Findings: AIMS:  ,  ,  ,  ,  ,  ,   CIWA:    COWS:     Musculoskeletal: Strength & Muscle Tone: within normal limits Gait & Station: normal Patient leans: N/A  Psychiatric Specialty Exam:  Presentation  General Appearance:  Casual  Eye Contact: Good  Speech: Clear and Coherent; Normal Rate  Speech Volume: Normal  Handedness: Right  Mood and Affect  Mood: -- (Improved.)  Affect: Congruent  Thought Process  Thought Processes: Coherent; Goal Directed; Linear  Descriptions of Associations:Intact  Orientation:Full (Time, Place and Person)  Thought Content: Logical  History of Schizophrenia/Schizoaffective disorder:No data recorded Duration of Psychotic Symptoms:No data recorded Hallucinations:Hallucinations: None   Ideas of Reference:None  Suicidal Thoughts:Suicidal Thoughts: No   Homicidal Thoughts:Homicidal Thoughts: No   Sensorium  Memory: Immediate Good; Recent Good; Remote Good  Judgment: Good  Insight: Fair  Art therapist  Concentration: Good  Attention Span: Good  Recall: Good  Fund of Knowledge: Fair  Language: Good  Psychomotor Activity  Psychomotor Activity: Psychomotor Activity: Decreased   Assets  Assets: Communication Skills; Desire for Improvement; Housing; Resilience  Sleep  Sleep: Sleep: Good Number of Hours of Sleep: 7.5   Physical Exam: Physical Exam Vitals and nursing note reviewed.  Constitutional:      Appearance: She is obese.  HENT:     Head: Normocephalic.     Right Ear: External ear normal.     Left Ear: External ear normal.     Nose:  Nose normal.     Mouth/Throat:     Mouth: Mucous membranes are moist.     Pharynx: Oropharynx is clear.  Eyes:     Extraocular Movements: Extraocular movements intact.  Cardiovascular:     Rate and Rhythm: Normal rate.     Pulses: Normal pulses.     Comments: Currently using wheel chair due to lower extremity weakness. Pulmonary:     Effort: Pulmonary effort is normal.  Abdominal:     Comments: Deferred  Genitourinary:    Comments: Deferred. Musculoskeletal:        General: Normal range of motion.     Cervical back: Normal range of motion.  Skin:    General: Skin is dry.  Neurological:     General: No focal deficit present.     Mental Status: She is alert and oriented to person, place, and time.  Psychiatric:        Mood and Affect: Mood normal.        Behavior: Behavior normal.    Review of Systems  Constitutional:  Negative for  chills, diaphoresis and fever.  HENT:  Negative for congestion and sore throat.   Eyes:  Negative for blurred vision.  Respiratory:  Negative for cough, shortness of breath and wheezing.   Cardiovascular:  Negative for chest pain and palpitations.  Gastrointestinal:  Negative for abdominal pain, diarrhea, heartburn, nausea and vomiting.  Genitourinary:  Negative for dysuria.  Musculoskeletal:        Currently using wheel chair due to lower extremity weakness.  Neurological:  Negative for dizziness, tingling, tremors, sensory change, speech change, focal weakness, seizures, loss of consciousness, weakness and headaches.  Endo/Heme/Allergies:        Allergies: Lisinopril, Losartan.  Psychiatric/Behavioral:  Positive for depression. Negative for hallucinations, memory loss and substance abuse. Suicidal ideas: Hx of.The patient is nervous/anxious and has insomnia.    Blood pressure 111/65, pulse 79, temperature 98.1 F (36.7 C), temperature source Oral, resp. rate 16, height 5' 7 (1.702 m), weight 123.1 kg, SpO2 96%. Body mass index is 42.51  kg/m.  Treatment Plan Summary: Daily contact with patient to assess and evaluate symptoms and progress in treatment and Medication management.  Principal/active diagnoses.  Major depressive disorder, recurrent severe without psychotic features (HCC).    Medical. Diabetes mellitus.  HTN.  Plan: The risks/benefits/side-effects/alternatives to the medications in use were discussed in detail with the patient and time was given for patient's  questions. The patient consents to medication trial.    -Continue Abilify  15 mg po daily for mood control.  -Continue Clonazepam  1 mg po bid for anxiety.  -Continue Sertraline  200 mg po daily for depression.  -Continue Nicotine  patch 14 mg trans-dermally for nicotine  withdrawal.   Agitation protocols.  -Continue as recommended.    Medical:  -Continue Protonix  40 mg po Q am for indigestion.  -Continue The Afrin Nasal spray per each nare bid. -Continue Lantus  insulin  10 units bid for DM.  -Continue the sliding scale insulin  as recommended per blood sugar results.   Other PRNS -Continue Tylenol  650 mg every 6 hours PRN for mild pain -Continue Maalox 30 ml Q 4 hrs PRN for indigestion -Continue MOM 30 ml po Q 6 hrs for constipation.    Safety and Monitoring: Voluntary admission to inpatient psychiatric unit for safety, stabilization and treatment Daily contact with patient to assess and evaluate symptoms and progress in treatment Patient's case to be discussed in multi-disciplinary team meeting Observation Level : q15 minute checks Vital signs: q12 hours Precautions: Safety   Mac Bolster, NP. 08/04/2024, 3:22 PM Patient ID: Vanessa Barajas, female   DOB: [birth year], 53 y.o.   MRN: [id number] Patient ID: Daiana Vitiello, female   DOB: [birth year], 53 y.o.   MRN: [id number] Patient ID: Shandy Checo, female   DOB: 10-28-1971, 53 y.o.   MRN: [id number] Patient ID: Hikari Tripp, female   DOB: 1971-05-02, 53 y.o.   MRN: [id number]

## 2024-08-04 NOTE — Group Note (Signed)
 LCSW Group Therapy Note   Group Date: 08/04/2024 Start Time: 1100 End Time: 1200   Participation:  did not attend  Type of Therapy:  Group Therapy  Topic:  Healing Flames: Navigating Anger with Compassion  Objective:  Foster self-awareness and promote compassion toward oneself and others when dealing with anger.  Goals:  Help participants understand the underlying emotions and needs fueling anger. Provide coping strategies for healthier emotional expression and anger management.  Summary: This session explored anger as a volcano - an explosion driven by deeper feelings and unmet needs. Participants learned to identify anger triggers and underlying emotions, then practiced coping strategies like deep breathing, physical activity, and journaling. The group discussed healthy ways to manage anger before it escalates, using both personal reflection and shared experiences.  Therapeutic Modalities: Cognitive Behavioral Therapy (CBT): Challenging thoughts that fuel anger. Mindfulness: Increasing awareness of emotions and sensations.   Aspen Lawrance O Taro Hidrogo, LCSWA 08/04/2024  12:49 PM

## 2024-08-04 NOTE — Progress Notes (Signed)
(  Sleep Hours) - 9.25 (Any PRNs that were needed, meds refused, or side effects to meds)- No PRN meds given, no meds refused  (Any disturbances and when (visitation, over night)- None  (Concerns raised by the patient)- None  (SI/HI/AVH)- Denies SI/HI/AVH

## 2024-08-04 NOTE — Progress Notes (Signed)
   08/04/24 1000  Psych Admission Type (Psych Patients Only)  Admission Status Voluntary  Psychosocial Assessment  Patient Complaints Anxiety;Depression  Eye Contact Fair  Facial Expression Animated  Affect Appropriate to circumstance  Speech Logical/coherent  Interaction Assertive  Motor Activity Slow  Appearance/Hygiene Unremarkable  Behavior Characteristics Cooperative;Calm  Mood Depressed;Pleasant  Thought Process  Coherency WDL  Content WDL  Delusions None reported or observed  Perception WDL  Hallucination None reported or observed  Judgment Impaired  Confusion None  Danger to Self  Current suicidal ideation? Denies  Self-Injurious Behavior No self-injurious ideation or behavior indicators observed or expressed   Agreement Not to Harm Self Yes  Description of Agreement verbal  Danger to Others  Danger to Others None reported or observed

## 2024-08-05 LAB — GLUCOSE, CAPILLARY: Glucose-Capillary: 165 mg/dL — ABNORMAL HIGH (ref 70–99)

## 2024-08-05 MED ORDER — CLONAZEPAM 1 MG PO TABS: 1.0000 mg | ORAL_TABLET | Freq: Two times a day (BID) | ORAL | 0 refills | Status: AC

## 2024-08-05 MED ORDER — INSULIN GLARGINE 100 UNIT/ML ~~LOC~~ SOLN: 10.0000 [IU] | Freq: Two times a day (BID) | SUBCUTANEOUS | 11 refills | Status: AC

## 2024-08-05 MED ORDER — ARIPIPRAZOLE 15 MG PO TABS: 15.0000 mg | ORAL_TABLET | Freq: Every day | ORAL | 0 refills | Status: AC

## 2024-08-05 MED ORDER — SERTRALINE HCL 100 MG PO TABS: 200.0000 mg | ORAL_TABLET | Freq: Every day | ORAL | 0 refills | Status: AC

## 2024-08-05 MED ORDER — NOVOLIN R FLEXPEN 100 UNIT/ML IJ SOPN: 15.0000 [IU] | PEN_INJECTOR | Freq: Three times a day (TID) | INTRAMUSCULAR | 0 refills | Status: AC

## 2024-08-05 NOTE — Progress Notes (Signed)
 Patient verbalizes readiness for discharge. All patient belongings returned to patient. Discharge instructions read and discussed with patient (appointments, medications, resources). Patient expressed gratitude for care provided. Patient discharged to lobby at 1145 where taxi driver was waiting.

## 2024-08-05 NOTE — Transportation (Signed)
 08/05/2024  Vanessa Barajas DOB: Oct 12, 1971 MRN: [id number]   RIDER WAIVER AND RELEASE OF LIABILITY  For the purposes of helping with transportation needs, [facility] partners with outside transportation providers (taxi companies, [place], Catering manager.) to give [name] patients or other approved people the choice of on-demand rides Public librarian) to our buildings for non-emergency visits.  By using [employer], I, the person signing this document, on behalf of myself and/or any legal minors (in my care using the [employer]), agree:  Science writer given to me are supplied by independent, outside transportation providers who do not work for, or have any affiliation with, [employer]. [place] is not a transportation company. [facility] has no control over the quality or safety of the rides I get using [employer]. [facility] has no control over whether any outside ride will happen on time or not. [facility] gives no guarantee on the reliability, quality, safety, or availability on any rides, or that no mistakes will happen. I know and accept that traveling by vehicle (car, truck, SVU, fleeta, bus, taxi, etc.) has risks of serious injuries such as disability, being paralyzed, and death. I know and agree the risk of using [employer] is mine alone, and not [employer]. [employer] are provided as is and as are available. The transportation providers are in charge for all inspections and care of the vehicles used to provide these rides. I agree not to take legal action against [employer], its agents, employees, officers, directors, representatives, insurers, attorneys, assigns, successors, subsidiaries, and affiliates at any time for any reasons related directly or indirectly to using [employer]. I also agree not to take legal action against [employer] or its affiliates for any injury, death, or damage to property caused by or related to using  [employer]. I have read this Waiver and Release of Liability, and I understand the terms used in it and their legal meaning. This Waiver is freely and voluntarily given with the understanding that my right (or any legal minors) to legal action against [employer] relating to [employer] is knowingly given up to use these services.   I attest that I read the Ride Waiver and Release of Liability to [name], gave [name] the opportunity to ask questions and answered the questions asked (if any). I affirm that [name] then provided consent for assistance with transportation.

## 2024-08-05 NOTE — Progress Notes (Signed)
  [facility] Adult Case Management Discharge Plan :  Will you be returning to the same living situation after discharge:  Yes,  patient will be discharging back to her home. Address on file.  At discharge, do you have transportation home?: No. CSW will prepare transportation with taxi voucher through [employer] at 11 am.  Do you have the ability to pay for your medications: Yes,  patient has active health insurance.   Release of information consent forms completed and in the chart;  Patient's signature needed at discharge.  Patient to Follow up at:  Follow-up Information     [facility]. Go on 08/11/2024.   Why: You have a hospital follow up appointment for therapy and medication management services on 10/2 at 8:00 am.  The appointment will be Virtual, telehealth. Contact information: [address]  [address] [place] [address] [phone]                 Next level of care provider has access to [facility] Link:no  Safety Planning and Suicide Prevention discussed: Yes,  completed with patient.      Has patient been referred to the Quitline?: Patient does not use tobacco/nicotine  products  Patient has been referred for addiction treatment: No known substance use disorder.  Louetta Lame, LCSWA 08/05/2024, 9:34 AM

## 2024-08-05 NOTE — [facility] Suicide Risk Assessment (Signed)
 [facility] INPATIENT:  Family/Significant Other Suicide Prevention Education  Suicide Prevention Education:  Contact Attempts: Olen Eagles (daughter) [phone], (name of family member/significant other) has been identified by the patient as the family member/significant other with whom the patient will be residing, and identified as the person(s) who will aid the patient in the event of a mental health crisis.  With written consent from the patient, two attempts were made to provide suicide prevention education, prior to and/or following the patient's discharge.  We were unsuccessful in providing suicide prevention education.  A suicide education pamphlet was given to the patient to share with family/significant other.  Date and time of first attempt: 08/04/24 / 3:02 pm Date and time of second attempt: 08/05/24 / 9:14 am  Vanessa Barajas 08/05/2024, 9:13 AM

## 2024-08-05 NOTE — Discharge Summary (Signed)
 Physician Discharge Summary Note  Patient:  Vanessa Barajas is an 53 y.o., female  MRN:  [id number]  DOB:  06/28/1971  Patient phone:  [phone] (home)   Patient address:   [address] [place] [address],   Total Time spent with patient: Greater than 30 minutes.  Date of Admission:  07/29/2024  Date of Discharge: 08-05-24  Reason for Admission:  This is the second psychiatric admission evaluation in the [facility] health system. Patient was admitted, treated & discharged from the [facility] behavioral unit on 07-16-24 after a suicide attempt on her insulin . Patient was discharged to follow-up care at the [facility] clinic. It is quite unknown if patient was keeping her psychiatric appointment or not. Patient is currently battling some chronic medical problems that included (obesity, DM, HTN). Per chart review, patient has not been compliant with her recommended treatment regimen. Vanessa Barajas is being admitted to the [facility] with complaint of suicide attempt by overdose on her Norvasc  tablets. After medical evaluation/stabilization, she was transferred to the [facility] for further psychiatric evaluation/treatments. During this evaluation, patient reports,    I was at the [facility] for two days prior to being transferred to this place. I had overdosed on my blood pressure pills called Norvasc . I did it on purpose to kill myself. I have been on medical leave from work. However, my PTO days are now used up. I could no longer pay my bills. This is the second time that I had overdosed in 3 weeks. I have been feeling very depressed for one year. The reason for my depression is because I feel alone. I have no support system. I have only one daughter, but we are estranged. I'm just weak & tired. I could not sleep at all last night. My sinuses feels clogged up. I desperately need the case walker so that I can inquire about my rent & how I can get some help to pay it. I have no money for food. My depression  currently is #10 & anxiety #10.   Principal Problem: Major depressive disorder, recurrent severe without psychotic features [facility])  Discharge Diagnoses: Principal Problem:   Major depressive disorder, recurrent severe without psychotic features (HCC)  Past Psychiatric History: Major depressive disorder, recurrent episodes.  Past Medical History:  Past Medical History:  Diagnosis Date   Anxiety    Diabetes mellitus without complication (HCC)    Hemorrhoids    Hypertension     Past Surgical History:  Procedure Laterality Date   ACHILLES TENDON SURGERY     ADENOIDECTOMY     ECTOPIC PREGNANCY SURGERY     x2   HERNIA REPAIR     TONSILLECTOMY     TUBAL LIGATION       Hospital Course: (Per admission evaluation notes): This is the second psychiatric admission evaluation in the [facility] health system. Patient was admitted, treated & discharged from the [facility] behavioral unit on 07-16-24 after a suicide attempt on her insulin . Patient was discharged to follow-up care at the [facility] clinic. It is quite unknown if patient was keeping her psychiatric appointment or not. Patient is currently battling some chronic medical problems that included (obesity, DM, HTN). Per chart review, patient has not been compliant with her recommended treatment regimen. Vanessa Barajas is being admitted to the [facility] with complaint of suicide attempt by overdose on her Norvasc  tablets. After medical evaluation/stabilization, she was transferred to the [facility] for further psychiatric evaluation/treatments.   Upon admission her outpatient medications were continued, these consisted  of Abilify  15 mg daily, clonazepam  1 mg BID, and sertraline  200 mg daily. She tolerated these well without any significant side effects. During this admission the patient largely remained in her room due to mobility issues and reported weakness. When out of bed she moved about the unit using a wheelchair. During this admission she was noted to exhibit low oxygen  saturations in the low 90s and high 80s a few times, particularly during the night. She did not have a history of known OSA. O2 sats improved when repositioned to upright. As the hospitalization progressed she reported gradual reduction in suicidal ideation and improvement in her mood. Most of her reported mood symptoms were believed to be due to chronic medical and social stressors. By 9/25 she reported feeling better and ready to return home. On the day of discharge she endorsed a euthymic mood and denied suicidal ideation.      Musculoskeletal: Strength & Muscle Tone: within normal limits Gait & Station: normal Patient leans: N/A   Psychiatric Specialty Exam:  Presentation  General Appearance:  Appropriate for Environment; Casual  Eye Contact: Good  Speech: Clear and Coherent  Speech Volume: Normal  Handedness: Right  Mood and Affect  Mood: Euthymic  Affect: Appropriate; Congruent  Thought Process  Thought Processes: Coherent; Goal Directed; Linear  Descriptions of Associations:Intact  Orientation:Full (Time, Place and Person)  Thought Content:Logical  History of Schizophrenia/Schizoaffective disorder: NA  Duration of Psychotic Symptoms: NA  Hallucinations:Hallucinations: None  Ideas of Reference:None  Suicidal Thoughts:Suicidal Thoughts: No  Homicidal Thoughts:Homicidal Thoughts: No  Sensorium  Memory: Immediate Good; Recent Good; Remote Good  Judgment: Good  Insight: Good  Executive Functions  Concentration: Good  Attention Span: Good  Recall: Good  Fund of Knowledge: Fair  Language: Good  Psychomotor Activity  Psychomotor Activity: Psychomotor Activity: Normal  Assets  Assets: Communication Skills; Desire for Improvement; Housing; Resilience  Sleep  Sleep: Sleep: Good  Estimated Sleeping Duration (Last 24 Hours): 6.75-7.75 hours  Physical Exam: Physical Exam Vitals and nursing note reviewed.  Constitutional:       Appearance: She is obese.  HENT:     Head: Normocephalic.     Nose: Nose normal.     Mouth/Throat:     Pharynx: Oropharynx is clear.  Eyes:     Pupils: Pupils are equal, round, and reactive to light.  Cardiovascular:     Rate and Rhythm: Normal rate.     Pulses: Normal pulses.  Pulmonary:     Effort: Pulmonary effort is normal.  Genitourinary:    Comments: Deferred Musculoskeletal:        General: Normal range of motion.     Cervical back: Normal range of motion.  Skin:    General: Skin is dry.  Neurological:     General: No focal deficit present.     Mental Status: She is alert and oriented to person, place, and time. Mental status is at baseline.    Review of Systems  Constitutional:  Negative for chills, diaphoresis and fever.  HENT:  Negative for congestion and sore throat.   Respiratory:  Negative for cough, shortness of breath and wheezing.   Cardiovascular:  Negative for chest pain and palpitations.  Gastrointestinal:  Negative for abdominal pain, constipation, diarrhea, heartburn, nausea and vomiting.  Genitourinary:  Negative for dysuria.  Musculoskeletal:  Negative for joint pain and myalgias.  Neurological:  Negative for dizziness, tingling, tremors, sensory change, speech change, focal weakness, seizures, loss of consciousness, weakness and headaches.  Endo/Heme/Allergies:  Allergies: Lisinopril, Losartan.  Psychiatric/Behavioral:  Positive for depression (Hx of (stable on medication).). Negative for hallucinations (Hx of (stable upon discharge).), memory loss, substance abuse and suicidal ideas (Hx of suicide attempts by overdose (stable).). The patient is not nervous/anxious and does not have insomnia.    Blood pressure 104/61, pulse 79, temperature 98.1 F (36.7 C), temperature source Oral, resp. rate 16, height 5' 7 (1.702 m), weight 123.1 kg, SpO2 92%. Body mass index is 42.51 kg/m.   Social History   Tobacco Use  Smoking Status Former    Current packs/day: 0.00   Types: Cigarettes   Quit date: 09/02/2013   Years since quitting: 10.9  Smokeless Tobacco Never   Tobacco Cessation:  An FDA-approved tobacco cessation medication recommended at discharge  Blood Alcohol level:  Lab Results  Component Value Date   [facility] <15 07/09/2024   Metabolic Disorder Labs:  Lab Results  Component Value Date   HGBA1C 11.5 (H) 07/13/2024   MPG 283 07/13/2024   MPG 280.48 07/10/2024   No results found for: PROLACTIN Lab Results  Component Value Date   CHOL 206 (H) 07/13/2024   TRIG 140 07/13/2024   HDL 40 (L) 07/13/2024   CHOLHDL 5.2 07/13/2024   VLDL 28 07/13/2024   LDLCALC 138 (H) 07/13/2024   LDLCALC UNABLE TO CALCULATE IF TRIGLYCERIDE OVER 400 mg/dL 90/78/7987   See Psychiatric Specialty Exam and Suicide Risk Assessment completed by Attending Physician prior to discharge.  Discharge destination:  Home  Is patient on multiple antipsychotic therapies at discharge:  No   Has Patient had three or more failed trials of antipsychotic monotherapy by history:  No  Recommended Plan for Multiple Antipsychotic Therapies: NA  Allergies as of 08/05/2024       Reactions   Lisinopril Swelling   Angioedema   Losartan Swelling        Medication List     STOP taking these medications    glipiZIDE  5 MG 24 hr tablet Commonly known as: GLUCOTROL  XL       TAKE these medications      Indication  ARIPiprazole  15 MG tablet Commonly known as: ABILIFY  Take 1 tablet (15 mg total) by mouth daily. For mood control What changed: additional instructions  Indication: Major Depressive Disorder, Mood control   clonazePAM  1 MG tablet Commonly known as: KLONOPIN  Take 1 tablet (1 mg total) by mouth 2 (two) times daily. For anxiety What changed:  when to take this additional instructions  Indication: Feeling Anxious   fluticasone  50 MCG/ACT nasal spray Commonly known as: FLONASE  Place 2 sprays into both nostrils daily as needed  for allergies or rhinitis.  Indication: Stuffy Nose   insulin  glargine 100 UNIT/ML injection Commonly known as: LANTUS  Inject 0.1 mLs (10 Units total) into the skin 2 (two) times daily. For diabetes management. What changed:  how much to take when to take this additional instructions  Indication: Type 2 Diabetes   nicotine  7 mg/24hr patch Commonly known as: NICODERM CQ  - dosed in mg/24 hr Place 1 patch (7 mg total) onto the skin daily.  Indication: Nicotine  Addiction   NovoLIN  R FlexPen ReliOn 100 UNIT/ML FlexPen Generic drug: Insulin  Regular Human Inject 15 Units into the skin 3 (three) times daily. For diabetes management. What changed: additional instructions  Indication: Type 2 Diabetes   pantoprazole  40 MG tablet Commonly known as: PROTONIX  Take 40 mg by mouth daily.  Indication: Gerd.   sertraline  100 MG tablet Commonly known as: ZOLOFT  Take 2  tablets (200 mg total) by mouth daily. For depression. What changed: additional instructions  Indication: Major Depressive Disorder        Follow-up Information     [facility] Go on 08/11/2024.   Why: You have a hospital follow up appointment for therapy and medication management services on 10/2 at 8:00 am.  The appointment will be Virtual, telehealth. Contact information: [address]  [address] [place] [address] [phone]                Plan Of Care/Follow-up recommendations:  Activity: as tolerated Diet: heart healthy  Other: -Follow-up with your outpatient psychiatric provider -instructions on appointment date, time, and address (location) are provided to you in discharge paperwork.  -Take your psychiatric medications as prescribed at discharge - instructions are provided to you in the discharge paperwork  -Follow-up with outpatient primary care doctor and other specialists -for management of preventative medicine and chronic medical issues  -Testing: Follow-up with outpatient provider for  abnormal lab results: NA  -If you are prescribed an atypical antipsychotic medication, we recommend that your outpatient psychiatrist follow routine screening for side effects within 3 months of discharge, including monitoring: AIMS scale, height, weight, blood pressure, fasting lipid panel, HbA1c, and fasting blood sugar.   -Recommend total abstinence from alcohol, tobacco, and other illicit drug use at discharge.   -If your psychiatric symptoms recur, worsen, or if you have side effects to your psychiatric medications, call your outpatient psychiatric provider, 911, [phone] or go to the nearest emergency department.  -If suicidal thoughts occur, immediately call your outpatient psychiatric provider, 911, [phone] or go to the nearest emergency department.  Signed: Mac Bolster, NP, pmhnp, fnp-bc. 08/05/2024, 11:42 AM

## 2024-08-05 NOTE — Group Note (Signed)
 Date:  08/05/2024 Time:  9:08 AM  Group Topic/Focus:  Goals Group:   The focus of this group is to help patients establish daily goals to achieve during treatment and discuss how the patient can incorporate goal setting into their daily lives to aide in recovery. Patients were asked two questions: What is something you can do today to take care of your mental health? What is 1 thing you're proud of doing lately?    Participation Level:  Did Not Attend  Participation Quality:  N/A  Affect:  N/A  Cognitive:  N/A  Insight: None  Engagement in Group:  N/A  Modes of Intervention:  N/A  Additional Comments:  Patient did not attend goals group.  Kristi HERO Rigoberto Repass 08/05/2024, 9:08 AM

## 2024-08-05 NOTE — BHH Suicide Risk Assessment (Signed)
 Suicide Risk Assessment  Discharge Assessment    [facility] Discharge Suicide Risk Assessment   Principal Problem: Major depressive disorder, recurrent severe without psychotic features [facility]) Discharge Diagnoses: Principal Problem:   Major depressive disorder, recurrent severe without psychotic features (HCC)  Total Time spent with patient: 45 minutes  Musculoskeletal: Strength & Muscle Tone: within normal limits Gait & Station: normal Patient leans: N/A  Psychiatric Specialty Exam  Presentation  General Appearance:  Appropriate for Environment; Casual  Eye Contact: Good  Speech: Clear and Coherent  Speech Volume: Normal  Handedness: Right   Mood and Affect  Mood: Euthymic  Duration of Depression Symptoms: No data recorded Affect: Appropriate; Congruent   Thought Process  Thought Processes: Coherent; Goal Directed; Linear  Descriptions of Associations:Intact  Orientation:Full (Time, Place and Person)  Thought Content:Logical  History of Schizophrenia/Schizoaffective disorder:No data recorded Duration of Psychotic Symptoms:No data recorded Hallucinations:Hallucinations: None  Ideas of Reference:None  Suicidal Thoughts:Suicidal Thoughts: No  Homicidal Thoughts:Homicidal Thoughts: No   Sensorium  Memory: Immediate Good; Recent Good; Remote Good  Judgment: Good  Insight: Good   Executive Functions  Concentration: Good  Attention Span: Good  Recall: Good  Fund of Knowledge: Fair  Language: Good   Psychomotor Activity  Psychomotor Activity: Psychomotor Activity: Normal   Assets  Assets: Communication Skills; Desire for Improvement; Housing; Resilience   Sleep  Sleep: Sleep: Good  Estimated Sleeping Duration (Last 24 Hours): 6.75-7.75 hours  Physical Exam: See discharge summary.  Blood pressure 104/61, pulse 79, temperature 98.1 F (36.7 C), temperature source Oral, resp. rate 16, height 5' 7 (1.702 m), weight 123.1  kg, SpO2 92%. Body mass index is 42.51 kg/m.  Mental Status Per Nursing Assessment::   On Admission:  Suicidal ideation indicated by others  Demographic Factors:  Adolescent or young adult and Low socioeconomic status  Loss Factors: Financial problems/change in socioeconomic status  Historical Factors: Prior suicide attempts and Impulsivity  Risk Reduction Factors:   Sense of responsibility to family, Positive therapeutic relationship, and Positive coping skills or problem solving skills  Continued Clinical Symptoms:  Depression:   Impulsivity Previous Psychiatric Diagnoses and Treatments Medical Diagnoses and Treatments/Surgeries  Cognitive Features That Contribute To Risk:  Thought constriction (tunnel vision)    Suicide Risk:  Minimal: No identifiable suicidal ideation.  Patients presenting with no risk factors but with morbid ruminations; may be classified as minimal risk based on the severity of the depressive symptoms   Follow-up Information     [facility]. Go on 08/11/2024.   Why: You have a hospital follow up appointment for therapy and medication management services on 10/2 at 8:00 am.  The appointment will be Virtual, telehealth. Contact information: [address]  [address] [place] [address] [phone]                Plan Of Care/Follow-up recommendations:  See the discharge recommendation above.  Mac Bolster, NP, pmhnp, fnp-bc. 08/05/2024, 9:34 AM

## 2024-08-05 NOTE — Progress Notes (Signed)
(  Sleep Hours) - 8 (Any PRNs that were needed, meds refused, or side effects to meds)- No PRN meds given, no meds refused.  (Any disturbances and when (visitation, over night)- None  (Concerns raised by the patient)- None  (SI/HI/AVH)- Denies SI/HI/AVH

## 2024-12-15 ENCOUNTER — Encounter (HOSPITAL_COMMUNITY): Payer: Self-pay | Admitting: Emergency Medicine

## 2024-12-15 ENCOUNTER — Emergency Department (HOSPITAL_COMMUNITY)
Admission: EM | Admit: 2024-12-15 | Discharge: 2024-12-15 | Disposition: A | Discharge status: Home / Self Care | Source: Home / Self Care | Attending: Emergency Medicine | Admitting: Emergency Medicine

## 2024-12-15 ENCOUNTER — Other Ambulatory Visit: Payer: Self-pay

## 2024-12-15 ENCOUNTER — Emergency Department (HOSPITAL_COMMUNITY)

## 2024-12-15 DIAGNOSIS — R739 Hyperglycemia, unspecified: Secondary | ICD-10-CM

## 2024-12-15 DIAGNOSIS — K529 Noninfective gastroenteritis and colitis, unspecified: Secondary | ICD-10-CM

## 2024-12-15 LAB — LIPASE, BLOOD: Lipase: 13 U/L (ref 11–51)

## 2024-12-15 LAB — URINALYSIS, ROUTINE W REFLEX MICROSCOPIC
Bilirubin Urine: NEGATIVE
Glucose, UA: >=500 mg/dL — AB
Hgb urine dipstick: NEGATIVE
Ketones, ur: NEGATIVE mg/dL
Leukocytes,Ua: NEGATIVE
Nitrite: NEGATIVE
Protein, ur: NEGATIVE mg/dL
Specific Gravity, Urine: 1.033 — ABNORMAL HIGH (ref 1.005–1.030)
pH: 5 (ref 5.0–8.0)

## 2024-12-15 LAB — COMPREHENSIVE METABOLIC PANEL WITH GFR
ALT: 21 U/L (ref 0–44)
AST: 20 U/L (ref 15–41)
Albumin: 3.9 g/dL (ref 3.5–5.0)
Alkaline Phosphatase: 150 U/L — ABNORMAL HIGH (ref 38–126)
Anion gap: 12 (ref 5–15)
BUN: 9 mg/dL (ref 6–20)
CO2: 23 mmol/L (ref 22–32)
Calcium: 8.9 mg/dL (ref 8.9–10.3)
Chloride: 100 mmol/L (ref 98–111)
Creatinine, Ser: 0.93 mg/dL (ref 0.44–1.00)
GFR, Estimated: >60 mL/min
Glucose, Bld: 509 mg/dL (ref 70–99)
Potassium: 3.8 mmol/L (ref 3.5–5.1)
Sodium: 135 mmol/L (ref 135–145)
Total Bilirubin: 0.3 mg/dL (ref 0.0–1.2)
Total Protein: 6.5 g/dL (ref 6.5–8.1)

## 2024-12-15 LAB — CBC
HCT: 42.4 % (ref 36.0–46.0)
Hemoglobin: 14.1 g/dL (ref 12.0–15.0)
MCH: 29.3 pg (ref 26.0–34.0)
MCHC: 33.3 g/dL (ref 30.0–36.0)
MCV: 88 fL (ref 80.0–100.0)
Platelets: 184 10*3/uL (ref 150–400)
RBC: 4.82 MIL/uL (ref 3.87–5.11)
RDW: 13 % (ref 11.5–15.5)
WBC: 6.9 10*3/uL (ref 4.0–10.5)
nRBC: 0 % (ref 0.0–0.2)

## 2024-12-15 LAB — CBG MONITORING, ED
Glucose-Capillary: 471 mg/dL — ABNORMAL HIGH (ref 70–99)
Glucose-Capillary: 334 mg/dL — ABNORMAL HIGH (ref 70–99)

## 2024-12-15 LAB — BETA-HYDROXYBUTYRIC ACID: Beta-Hydroxybutyric Acid: 0.13 mmol/L (ref 0.05–0.27)

## 2024-12-15 MED ORDER — IOHEXOL 300 MG/ML  SOLN
100.0000 mL | Freq: Once | INTRAMUSCULAR | Status: AC | PRN
  Administered 2024-12-15: 100 mL via INTRAVENOUS

## 2024-12-15 MED ORDER — ONDANSETRON HCL 4 MG/2ML IJ SOLN
4.0000 mg | Freq: Once | INTRAMUSCULAR | Status: AC
  Administered 2024-12-15: 4 mg via INTRAVENOUS
  Filled 2024-12-15: qty 2

## 2024-12-15 MED ORDER — INSULIN ASPART 100 UNIT/ML IJ SOLN
5.0000 [IU] | Freq: Once | INTRAMUSCULAR | Status: AC
  Administered 2024-12-15: 5 [IU] via SUBCUTANEOUS
  Filled 2024-12-15: qty 5

## 2024-12-15 MED ORDER — METOCLOPRAMIDE HCL 5 MG/ML IJ SOLN
10.0000 mg | Freq: Once | INTRAMUSCULAR | Status: AC
  Administered 2024-12-15: 10 mg via INTRAVENOUS
  Filled 2024-12-15: qty 2

## 2024-12-15 MED ORDER — LACTATED RINGERS IV BOLUS
1000.0000 mL | Freq: Once | INTRAVENOUS | Status: AC
  Administered 2024-12-15: 1000 mL via INTRAVENOUS

## 2024-12-15 MED ORDER — MORPHINE SULFATE (PF) 4 MG/ML IV SOLN
4.0000 mg | Freq: Once | INTRAVENOUS | Status: AC
  Administered 2024-12-15: 4 mg via INTRAVENOUS
  Filled 2024-12-15: qty 1

## 2024-12-15 MED ORDER — ONDANSETRON HCL 4 MG PO TABS: 4.0000 mg | ORAL_TABLET | Freq: Four times a day (QID) | ORAL | 0 refills | Status: AC

## 2024-12-15 NOTE — ED Triage Notes (Signed)
 Patient BIB EMS from home c/o Hyperglycemia >500 CBG at home. 500ml NS and Zofran  4mg  IV given by EMS. Patient report she took 10 units of regular insulin  an hour before EMS arrived. Patient report nausea denies vomiting. Denies chest pain and SOB.   BP 148/100 HR 90 RR 20 O2sat 99 %on RA

## 2024-12-15 NOTE — Discharge Instructions (Addendum)
 You were seen emergency department for your high blood sugar.  Your workup today showed no signs of DKA or other obvious infection.  You likely have a viral infection.  We gave you fluids and nausea medication and some insulin  in the emergency department with improvement of your sugars.  You should continue to monitor your sugars at home and take your insulin  as prescribed and can follow-up with your primary doctor in the next few days to have your symptoms and blood sugar rechecked.  You should return to the emergency department if your blood sugars too high to read on your monitor, you are having vomiting despite the nausea medicine, significantly worsening pain or any other new or concerning symptoms.

## 2024-12-15 NOTE — ED Notes (Signed)
 Commode provided at bedside. Pt attempting to provide urine sample.

## 2024-12-15 NOTE — ED Provider Notes (Signed)
 " [facility] AT [facility] Provider Note   CSN: [id number] Arrival date & time: 12/15/24  8064     Patient presents with: Hyperglycemia   Vanessa Barajas is a 54 y.o. female.   Patient is a 54 year old female with a past medical history of diabetes, hypertension, GERD, anxiety presenting to the emergency department with abdominal pain and hyperglycemia.  Patient states that she woke up this morning with right lower quadrant abdominal pain and feeling generally unwell.  She states that she felt nauseous and very thirsty.  He states that she checked her sugar this morning and it was 500.  She states that she gave herself some insulin  and tried to drink water.  She states that she initially called the ambulance and when they assessed her her blood sugar was still high and her vital signs otherwise looked okay so she decided to wait to give the insulin  some time but states that her pain has been persistent throughout the day and her sugars have stayed high.  She states that she does check her sugars regularly and they were previously normal this week.  She denies any fever.  She states she has had some loose stool when she eats.  She denies any dysuria or hematuria.  The history is provided by the patient.  Hyperglycemia      Prior to Admission medications  Medication Sig Start Date End Date Taking? Authorizing Provider  ondansetron  (ZOFRAN ) 4 MG tablet Take 1 tablet (4 mg total) by mouth every 6 (six) hours. 12/15/24  Yes Kingsley, Jeyda Siebel K, DO  ARIPiprazole  (ABILIFY ) 15 MG tablet Take 1 tablet (15 mg total) by mouth daily. For mood control 08/05/24   Collene Gouge I, NP  clonazePAM  (KLONOPIN ) 1 MG tablet Take 1 tablet (1 mg total) by mouth 2 (two) times daily. For anxiety 08/05/24   Collene Gouge I, NP  fluticasone  (FLONASE ) 50 MCG/ACT nasal spray Place 2 sprays into both nostrils daily as needed for allergies or rhinitis.    [provider]  insulin   glargine (LANTUS ) 100 UNIT/ML injection Inject 0.1 mLs (10 Units total) into the skin 2 (two) times daily. For diabetes management. 08/05/24   Collene Gouge I, NP  nicotine  (NICODERM CQ  - DOSED IN MG/24 HR) 7 mg/24hr patch Place 1 patch (7 mg total) onto the skin daily. 07/30/24   Lue Elsie BROCKS, MD  NOVOLIN  R FLEXPEN RELION 100 UNIT/ML FlexPen Inject 15 Units into the skin 3 (three) times daily. For diabetes management. 08/05/24   Collene Gouge I, NP  pantoprazole  (PROTONIX ) 40 MG tablet Take 40 mg by mouth daily. 01/12/23   [provider]  sertraline  (ZOLOFT ) 100 MG tablet Take 2 tablets (200 mg total) by mouth daily. For depression. 08/05/24   Collene Gouge I, NP    Allergies: Lisinopril and Losartan    Review of Systems  Updated Vital Signs BP (!) 142/64   Pulse 83   Temp 98.5 F (36.9 C) (Oral)   Resp 18   SpO2 97%   Physical Exam Vitals and nursing note reviewed.  Constitutional:      General: She is not in acute distress.    Appearance: Normal appearance. She is obese.  HENT:     Head: Normocephalic.     Nose: Nose normal.     Mouth/Throat:     Mouth: Mucous membranes are dry.     Pharynx: Oropharynx is clear.  Eyes:     Extraocular Movements: Extraocular  movements intact.     Conjunctiva/sclera: Conjunctivae normal.  Cardiovascular:     Rate and Rhythm: Normal rate and regular rhythm.     Heart sounds: Normal heart sounds.  Pulmonary:     Effort: Pulmonary effort is normal.     Breath sounds: Normal breath sounds.  Abdominal:     General: Abdomen is flat.     Palpations: Abdomen is soft.     Tenderness: There is abdominal tenderness (RLQ). There is no guarding or rebound.  Musculoskeletal:        General: Normal range of motion.     Cervical back: Normal range of motion.  Skin:    General: Skin is warm and dry.  Neurological:     General: No focal deficit present.     Mental Status: She is alert and oriented to person, place, and time.  Psychiatric:         Mood and Affect: Mood normal.        Behavior: Behavior normal.     (all labs ordered are listed, but only abnormal results are displayed) Labs Reviewed  URINALYSIS, ROUTINE W REFLEX MICROSCOPIC - Abnormal; Notable for the following components:      Result Value   Color, Urine STRAW (*)    Specific Gravity, Urine 1.033 (*)    Glucose, UA >=500 (*)    Bacteria, UA RARE (*)    All other components within normal limits  COMPREHENSIVE METABOLIC PANEL WITH GFR - Abnormal; Notable for the following components:   Glucose, Bld 509 (*)    Alkaline Phosphatase 150 (*)    All other components within normal limits  CBG MONITORING, ED - Abnormal; Notable for the following components:   Glucose-Capillary 471 (*)    All other components within normal limits  CBG MONITORING, ED - Abnormal; Notable for the following components:   Glucose-Capillary 334 (*)    All other components within normal limits  CBC  LIPASE, BLOOD  BETA-HYDROXYBUTYRIC ACID    EKG: EKG Interpretation Date/Time:  Thursday December 15 2024 19:57:54 EST Ventricular Rate:  88 PR Interval:  158 QRS Duration:  96 QT Interval:  389 QTC Calculation: 471 R Axis:   48  Text Interpretation: Sinus rhythm Low voltage, precordial leads Confirmed by Ellouise Fine ([id number]) on 12/15/2024 8:29:14 PM  Radiology: CT ABDOMEN PELVIS W CONTRAST Result Date: 12/15/2024 EXAM: CT ABDOMEN AND PELVIS WITH CONTRAST 12/15/2024 10:01:53 PM TECHNIQUE: CT of the abdomen and pelvis was performed with the administration of 100 mL of iohexol  (OMNIPAQUE ) 300 MG/ML solution. Multiplanar reformatted images are provided for review. Automated exposure control, iterative reconstruction, and/or weight-based adjustment of the mA/kV was utilized to reduce the radiation dose to as low as reasonably achievable. COMPARISON: None available. CLINICAL HISTORY: RLQ abdominal pain. Right lower quadrant abdominal pain. FINDINGS: LOWER CHEST: No acute abnormality.  LIVER: The liver is unremarkable. GALLBLADDER AND BILE DUCTS: Cholelithiasis with contracted gallbladder, without associated inflammatory changes. No biliary ductal dilatation. SPLEEN: No acute abnormality. PANCREAS: No acute abnormality. ADRENAL GLANDS: No acute abnormality. KIDNEYS, URETERS AND BLADDER: No stones in the kidneys or ureters. No hydronephrosis. No perinephric or periureteral stranding. Urinary bladder is unremarkable. GI AND BOWEL: Moderate hiatal hernia. Stomach demonstrates no acute abnormality. Normal appendix (image 59). There is no bowel obstruction. PERITONEUM AND RETROPERITONEUM: No ascites. No free air. VASCULATURE: Aorta is normal in caliber. LYMPH NODES: No lymphadenopathy. REPRODUCTIVE ORGANS: Uterus is within normal limits. BONES AND SOFT TISSUES: Mild degenerative changes at L2-L3. Small fat  containing lesion left lower abdominal wall ventral hernia (image 75). Tiny fat containing right lower pelvic wall ventral hernia (image 82). IMPRESSION: 1. No acute findings in the abdomen or pelvis. 2. Normal appendix. 3. Small lower abdominal wall ventral hernias, as above. Electronically signed by: Pinkie Pebbles MD 12/15/2024 10:08 PM EST RP Workstation: [id number]     Procedures   Medications Ordered in the ED  lactated ringers  bolus 1,000 mL (0 mLs Intravenous Stopped 12/15/24 2225)  morphine  (PF) 4 MG/ML injection 4 mg (4 mg Intravenous Given 12/15/24 2026)  ondansetron  (ZOFRAN ) injection 4 mg (4 mg Intravenous Given 12/15/24 2027)  insulin  aspart (novoLOG ) injection 5 Units (5 Units Subcutaneous Given 12/15/24 2223)  lactated ringers  bolus 1,000 mL (1,000 mLs Intravenous New Bag/Given 12/15/24 2223)  iohexol  (OMNIPAQUE ) 300 MG/ML solution 100 mL (100 mLs Intravenous Contrast Given 12/15/24 2153)  metoCLOPramide  (REGLAN ) injection 10 mg (10 mg Intravenous Given 12/15/24 2304)    Clinical Course as of 12/15/24 2316  Thu Dec 15, 2024  2139 Patient hyperglycemic without DKA. Will order  insulin  and continue fluids.  [VK]  2213 No acute abnormality on CT. UA pending.  [VK]  2250 Patient reports pain improving but still nauseous. Will be given reglan  and have PO challenge. UA negative. Will repeat glucose after 2nd liter of fluids completed.  [VK]  2313 Repeat glucose downtrending. She is stable for discharge home with outpatient follow up. [VK]    Clinical Course User Index [VK] Kingsley, Julianne Chamberlin K, DO                                 Medical Decision Making This patient presents to the ED with chief complaint(s) of abdominal pain, hyperglycemia with pertinent past medical history of diabetes, hypertension, GERD, anxiety which further complicates the presenting complaint. The complaint involves an extensive differential diagnosis and also carries with it a high risk of complications and morbidity.    The differential diagnosis includes hyperglycemia, DKA, dehydration, electrolyte abnormality, gastroenteritis, gastritis, GERD, pancreatitis, hepatitis, cholelithiasis, cholecystitis, appendicitis, diverticulitis, other intra-abdominal infection, UTI  Additional history obtained: Additional history obtained from N/A Records reviewed previous admission documents and Care Everywhere/External Records  ED Course and Reassessment: On patient's arrival she is hemodynamically stable in no acute distress.  Glucose on arrival is 471.  EKG showed normal sinus rhythm without acute ischemic changes.  Patient will have labs to evaluate for DKA or other etiology of her hyperglycemia as well as CT abdomen and pelvis to evaluate for etiology of her pain.  She was started on fluids and given pain and nausea control and will be closely reassessed.  Independent labs interpretation:  The following labs were independently interpreted: hyperglycemia without DKA, labs otherwise within normal range  Independent visualization of imaging: - I independently visualized the following imaging with scope of  interpretation limited to determining acute life threatening conditions related to emergency care: CTAP, which revealed no acute disease  Consultation: - Consulted or discussed management/test interpretation w/ external professional: N/A  Consideration for admission or further workup: Patient has no emergent conditions requiring admission or further work-up at this time and is stable for discharge home with primary care follow-up  Social Determinants of health: N/A    Amount and/or Complexity of Data Reviewed Labs: ordered. Radiology: ordered.  Risk Prescription drug management.       Final diagnoses:  Hyperglycemia  Gastroenteritis    ED Discharge Orders  Ordered    ondansetron  (ZOFRAN ) 4 MG tablet  Every 6 hours        12/15/24 2300               Kingsley, Khaliel Morey K, DO 12/15/24 2316  "
